# Patient Record
Sex: Female | Born: 1955 | Race: Black or African American | Hispanic: No | Marital: Single | Smoking: Never smoker
Health system: Southern US, Community
[De-identification: ages and names within clinical notes are randomized; demographics above are authoritative.]

## PROBLEM LIST (undated history)

## (undated) DIAGNOSIS — E785 Hyperlipidemia, unspecified: Secondary | ICD-10-CM

## (undated) DIAGNOSIS — F329 Major depressive disorder, single episode, unspecified: Secondary | ICD-10-CM

## (undated) DIAGNOSIS — M1990 Unspecified osteoarthritis, unspecified site: Secondary | ICD-10-CM

## (undated) DIAGNOSIS — I700 Atherosclerosis of aorta: Secondary | ICD-10-CM

## (undated) DIAGNOSIS — F32A Depression, unspecified: Secondary | ICD-10-CM

## (undated) DIAGNOSIS — D649 Anemia, unspecified: Secondary | ICD-10-CM

## (undated) DIAGNOSIS — E041 Nontoxic single thyroid nodule: Secondary | ICD-10-CM

## (undated) HISTORY — DX: Atherosclerosis of aorta: I70.0

## (undated) HISTORY — DX: Hyperlipidemia, unspecified: E78.5

## (undated) HISTORY — PX: UTERINE FIBROID EMBOLIZATION: SHX825

## (undated) HISTORY — DX: Anemia, unspecified: D64.9

## (undated) SURGERY — UPPER ENDOSCOPIC ULTRASOUND (EUS) LINEAR
Anesthesia: Monitor Anesthesia Care | Laterality: Left

---

## 1999-03-06 ENCOUNTER — Other Ambulatory Visit: Admission: RE | Admit: 1999-03-06 | Discharge: 1999-03-06 | Payer: Self-pay | Admitting: Obstetrics and Gynecology

## 1999-03-07 ENCOUNTER — Other Ambulatory Visit: Admission: RE | Admit: 1999-03-07 | Discharge: 1999-03-07 | Payer: Self-pay | Admitting: Obstetrics and Gynecology

## 2000-05-31 ENCOUNTER — Encounter: Payer: Self-pay | Admitting: Family Medicine

## 2000-05-31 ENCOUNTER — Encounter: Admission: RE | Admit: 2000-05-31 | Discharge: 2000-05-31 | Payer: Self-pay | Admitting: Family Medicine

## 2003-02-11 ENCOUNTER — Other Ambulatory Visit: Admission: RE | Admit: 2003-02-11 | Discharge: 2003-02-11 | Payer: Self-pay | Admitting: Obstetrics and Gynecology

## 2003-03-11 ENCOUNTER — Encounter: Payer: Self-pay | Admitting: Obstetrics

## 2003-03-11 ENCOUNTER — Ambulatory Visit (HOSPITAL_COMMUNITY): Admission: RE | Admit: 2003-03-11 | Discharge: 2003-03-11 | Payer: Self-pay | Admitting: Obstetrics and Gynecology

## 2003-03-12 ENCOUNTER — Ambulatory Visit (HOSPITAL_COMMUNITY): Admission: RE | Admit: 2003-03-12 | Discharge: 2003-03-12 | Payer: Self-pay | Admitting: Obstetrics

## 2003-03-12 ENCOUNTER — Encounter (INDEPENDENT_AMBULATORY_CARE_PROVIDER_SITE_OTHER): Payer: Self-pay | Admitting: Specialist

## 2003-04-26 ENCOUNTER — Ambulatory Visit (HOSPITAL_COMMUNITY): Admission: RE | Admit: 2003-04-26 | Discharge: 2003-04-26 | Payer: Self-pay | Admitting: Obstetrics

## 2003-04-26 ENCOUNTER — Encounter: Payer: Self-pay | Admitting: Obstetrics

## 2003-05-21 ENCOUNTER — Ambulatory Visit (HOSPITAL_COMMUNITY): Admission: RE | Admit: 2003-05-21 | Discharge: 2003-05-21 | Payer: Self-pay | Admitting: Interventional Radiology

## 2003-06-17 ENCOUNTER — Observation Stay (HOSPITAL_COMMUNITY): Admission: RE | Admit: 2003-06-17 | Discharge: 2003-06-18 | Payer: Self-pay | Admitting: Interventional Radiology

## 2004-03-17 ENCOUNTER — Other Ambulatory Visit: Admission: RE | Admit: 2004-03-17 | Discharge: 2004-03-17 | Payer: Self-pay | Admitting: Obstetrics and Gynecology

## 2005-08-31 ENCOUNTER — Other Ambulatory Visit: Admission: RE | Admit: 2005-08-31 | Discharge: 2005-08-31 | Payer: Self-pay | Admitting: Obstetrics and Gynecology

## 2006-08-07 ENCOUNTER — Ambulatory Visit (HOSPITAL_COMMUNITY): Admission: RE | Admit: 2006-08-07 | Discharge: 2006-08-07 | Payer: Self-pay | Admitting: Gastroenterology

## 2006-08-07 ENCOUNTER — Encounter (INDEPENDENT_AMBULATORY_CARE_PROVIDER_SITE_OTHER): Payer: Self-pay | Admitting: Specialist

## 2008-01-28 ENCOUNTER — Ambulatory Visit (HOSPITAL_BASED_OUTPATIENT_CLINIC_OR_DEPARTMENT_OTHER): Admission: RE | Admit: 2008-01-28 | Discharge: 2008-01-28 | Payer: Self-pay | Admitting: Surgery

## 2008-01-31 ENCOUNTER — Ambulatory Visit: Payer: Self-pay | Admitting: Internal Medicine

## 2008-02-03 ENCOUNTER — Ambulatory Visit (HOSPITAL_COMMUNITY): Admission: RE | Admit: 2008-02-03 | Discharge: 2008-02-03 | Payer: Self-pay | Admitting: Surgery

## 2008-02-10 ENCOUNTER — Ambulatory Visit (HOSPITAL_COMMUNITY): Admission: RE | Admit: 2008-02-10 | Discharge: 2008-02-10 | Payer: Self-pay | Admitting: Surgery

## 2008-03-16 ENCOUNTER — Encounter: Admission: RE | Admit: 2008-03-16 | Discharge: 2008-05-27 | Payer: Self-pay | Admitting: Surgery

## 2008-06-14 ENCOUNTER — Ambulatory Visit (HOSPITAL_COMMUNITY): Admission: RE | Admit: 2008-06-14 | Discharge: 2008-06-15 | Payer: Self-pay | Admitting: Surgery

## 2008-06-14 HISTORY — PX: LAPAROSCOPIC GASTRIC BANDING: SHX1100

## 2008-10-23 ENCOUNTER — Emergency Department (HOSPITAL_COMMUNITY): Admission: EM | Admit: 2008-10-23 | Discharge: 2008-10-23 | Payer: Self-pay | Admitting: *Deleted

## 2009-01-26 ENCOUNTER — Encounter: Admission: RE | Admit: 2009-01-26 | Discharge: 2009-04-26 | Payer: Self-pay | Admitting: Surgery

## 2010-02-06 ENCOUNTER — Encounter: Admission: RE | Admit: 2010-02-06 | Discharge: 2010-02-06 | Payer: Self-pay | Admitting: Surgery

## 2010-03-02 ENCOUNTER — Encounter: Admission: RE | Admit: 2010-03-02 | Discharge: 2010-03-02 | Payer: Self-pay | Admitting: Obstetrics and Gynecology

## 2010-03-28 ENCOUNTER — Encounter: Admission: RE | Admit: 2010-03-28 | Discharge: 2010-03-28 | Payer: Self-pay | Admitting: Surgery

## 2010-03-28 ENCOUNTER — Other Ambulatory Visit: Admission: RE | Admit: 2010-03-28 | Discharge: 2010-03-28 | Payer: Self-pay | Admitting: Interventional Radiology

## 2010-05-10 ENCOUNTER — Ambulatory Visit (HOSPITAL_COMMUNITY): Admission: RE | Admit: 2010-05-10 | Discharge: 2010-05-10 | Payer: Self-pay | Admitting: Endocrinology

## 2010-10-15 ENCOUNTER — Encounter: Payer: Self-pay | Admitting: Surgery

## 2011-01-08 LAB — URINE MICROSCOPIC-ADD ON

## 2011-01-08 LAB — DIFFERENTIAL
Basophils Relative: 1 % (ref 0–1)
Eosinophils Absolute: 0 10*3/uL (ref 0.0–0.7)
Lymphocytes Relative: 17 % (ref 12–46)
Monocytes Relative: 9 % (ref 3–12)
Neutro Abs: 2.9 10*3/uL (ref 1.7–7.7)

## 2011-01-08 LAB — URINALYSIS, ROUTINE W REFLEX MICROSCOPIC
Glucose, UA: NEGATIVE mg/dL
Hgb urine dipstick: NEGATIVE
Specific Gravity, Urine: 1.024 (ref 1.005–1.030)
Urobilinogen, UA: 0.2 mg/dL (ref 0.0–1.0)
pH: 6.5 (ref 5.0–8.0)

## 2011-01-08 LAB — COMPREHENSIVE METABOLIC PANEL
AST: 16 U/L (ref 0–37)
Albumin: 3.5 g/dL (ref 3.5–5.2)
Chloride: 105 mEq/L (ref 96–112)
Creatinine, Ser: 0.68 mg/dL (ref 0.4–1.2)
GFR calc Af Amer: >60 mL/min (ref >60)
Sodium: 140 mEq/L (ref 135–145)
Total Bilirubin: 0.9 mg/dL (ref 0.3–1.2)

## 2011-01-08 LAB — LIPASE, BLOOD: Lipase: 19 U/L (ref 11–59)

## 2011-01-08 LAB — CBC
MCV: 69.3 fL — ABNORMAL LOW (ref 78.0–100.0)
Platelets: 232 10*3/uL (ref 150–400)
WBC: 3.8 10*3/uL — ABNORMAL LOW (ref 4.0–10.5)

## 2011-02-06 NOTE — Procedures (Signed)
NAME:  Felicia Garrett, Felicia Garrett               ACCOUNT NO.:  [id number]   MEDICAL RECORD NO.:  [id number]          PATIENT TYPE:  OUT   LOCATION:  SLEEP CENTER                 FACILITY:  [facility]   PHYSICIAN:  Clinton D. Maple Hudson, MD, FCCP, FACPDATE OF BIRTH:  13-Aug-1954   DATE OF STUDY:  01/28/2008                            NOCTURNAL POLYSOMNOGRAM   REFERRING PHYSICIAN:   REFERRING PHYSICIAN:  Thornton Park. Daphine Deutscher, MD.   INDICATION FOR STUDY:  Hypersomnia with sleep apnea.   EPWORTH SLEEPINESS SCORE:  2/24.   BMI 61.8.  Weight 327 pounds.  Height 61 inches.  Neck 14 inches.   MEDICATIONS:  Home medication charted and reviewed.   SLEEP ARCHITECTURE:  Total sleep time 266 minutes with sleep efficiency  75.6%.  Stage I was 8.5%, stage II 85.2%, stage III absent, REM 6.4% of  total sleep time.  Sleep latency 23 minutes.  REM latency 81 minutes.  Awake after sleep onset 53 minutes.  Arousal index 14.7.  No bedtime  medication was taken.   RESPIRATORY DATA:  Apnea-hypopnea index (AHI) 9.9, indicating mild sleep  apnea.  A total of 44 events were scored, including 12 obstructive  apneas and 32 hypopneas.  Events were seen in all sleep positions.  REM  AHI 0.   OXYGEN DATA:  Mild to moderate snoring with oxygen desaturation to a  nadir of 35%, which looks artifactual.  Mean oxygen saturation through  the study was 95.4% on room air.  Less than 1% of time was spent with  saturation less than 88%.   CARDIAC DATA:  Normal sinus rhythm.   MOVEMENT-PARASOMNIA:  No significant movement disturbance.  Bathroom x2.   IMPRESSIONS-RECOMMENDATIONS:  1. Mild obstructive sleep apnea/hypopnea syndrome, AHI 9.9 per hour,      RDI 16.9 per hour.  Events were not positional.  Moderate snoring      with oxygen desaturation to a nadir of 35%, which was considered      artifact.  Mean oxygen saturation was 95.4%.  2. Scores in this range can be addressed with CPAP if clinically      indicated.  CPAP support in an  immediate      postoperative situation is recommended, either with empiric      pressure at 10 CWP or with autotitration.  Weight loss would      probably be beneficial.      Clinton D. Maple Hudson, MD, [facility], FACP  Diplomate, Biomedical engineer of Sleep Medicine  Electronically Signed     CDY/MEDQ  D:  01/31/2008 10:43:00  T:  01/31/2008 11:02:48  Job:  [id number]

## 2011-02-06 NOTE — Op Note (Signed)
NAMEJOAN, HERSCHBERGER               ACCOUNT NO.:  [id number]   MEDICAL RECORD NO.:  [id number]          PATIENT TYPE:  OIB   LOCATION:  1536                         FACILITY:  [facility]   PHYSICIAN:  Thornton Park. Daphine Deutscher, MD  DATE OF BIRTH:  [birth year]   DATE OF PROCEDURE:  06/14/2008  DATE OF DISCHARGE:  06/15/2008                               OPERATIVE REPORT   PREOPERATIVE DIAGNOSIS:  Morbid obesity with diabetes mellitus and  degenerative disk disease.   PROCEDURE:  Laparoscopic adjustable gastric band APS.   SURGEON:  Thornton Park. Daphine Deutscher, MD   ASSISTANT:  Sandria Bales. Ezzard Standing, M.D.   DESCRIPTION OF PROCEDURE:  Ms. Duana Benedict is a 55 year old African  American lady who was taken to room one on June 14, 2008 and given  general anesthesia.  The abdomen was prepped with Techni-Care and draped  sterilely.  I attempted to enter the abdomen through the left upper  quadrant cutting down in the usual spot anteriorly just below the costal  margin.  However, after working for long time with the OptiVu I could  not penetrate her fascia because it was so tough.   I then did a Hasson approach in the umbilicus and again found very tough  fascia and even performing this open, Dr. Ezzard Standing and I had difficulty  getting in but managed to put a holding stitch to get Hassan cannula in  and insufflated.  Two more ports were placed on the right side and  actually I ended up putting another two more ports in the left just to  get exposure.  I used a harmonic scalpel also.  She had a history of  maybe possibly a small sliding hiatal hernia but she was asymptomatic.  First we put the balloon down and I thought I could see a little bit of  a possible slider and I blew it up with 10 mL of air and with some  pulling back it would reduce after some manipulation but it did not just  plop back up into the chest.  However, I went ahead and dissected  posteriorly and found a very hypertrophic right crus, and kind  of a  hypotrophic left crus and was unable to really established much in the  way of a hiatal hernia back there.  Anteriorly there was only a hint  that there might be a hiatal hernia and after some dissection, I felt  like there was more potential for harm than good.   At that point I elected to put the band passer around and bring it out  along the left crural margin.  An APS band was inserted and brought  around and engaged.  The sizing tube was then placed as I snapped down  onto that.  I then plicated it anteriorly with four sutures using free  needles and tie knots.  When completed, everything looked good.  I also  placed this separate suture along the lesser curvature to the right crus  to tack that and everything looked to be in good position.  I brought  the tubing out through the  right-sided port and fashioned it.  However,  before doing that, I repaired the umbilical defect with 2 sutures of 0  Vicryl.  That was under insufflation and then I secured the band  port within the small transverse incision just above the umbilicus and  to the right.  Once done, all the ports were irrigated, injected with  some anesthetic agent, closed 4-0 Vicryl.  The patient tolerated the  procedure well.  She was taken to recovery room in satisfactory  condition.      Thornton Park Daphine Deutscher, MD  Electronically Signed     MBM/MEDQ  D:  06/14/2008  T:  06/15/2008  Job:  [id number]   cc:   Candyce Churn. Allyne Gee, M.D.  Fax: [phone]

## 2011-02-09 NOTE — Op Note (Signed)
   NAMEJAZARIA, Felicia Garrett                           ACCOUNT NO.:  [id number]   MEDICAL RECORD NO.:  [id number]                   PATIENT TYPE:  AMB   LOCATION:  SDC                                  FACILITY:  [facility]   PHYSICIAN:  Kathreen Cosier, M.D.           DATE OF BIRTH:  10-Nov-1955   DATE OF PROCEDURE:  03/12/2003  DATE OF DISCHARGE:                                 OPERATIVE REPORT   PREOPERATIVE DIAGNOSES:  1. Leiomyomata uteri.  2. Dysfunctional uterine bleeding.  3. Anemia.   PROCEDURES:  1. Hysteroscopy.  2. Dilatation and curettage.   Under general anesthesia, the patient in lithotomy position, abdomen,  perineum, and vagina prepped and draped, the bladder emptied with a straight  catheter.  Bimanual exam revealed the uterus was 14 size, irregular with  multiple myomas.  A bivalve speculum was placed in the vagina.  Anterior lip  of the cervix grasped with a tenaculum.  Endocervix was curetted and a small  amount of tissue obtained.  Endometrial cavity sounded 10 cm.  The cavity  was anterior and irregular.  Cervix dilated to a #25 Pratt.  Hysteroscope  inserted and the cavity was filled with sorbitol solution.  It was noted  that she had multiple myomas in the cavity and on her right side of the  uterus, there was a myoma which occupied at  least 3-4 cm of the cavity and  no ________ noted.  The sharp curettage was performed and then the cavity re-  examined, and apart from the myomas no abnormality was noted.  The procedure  was terminated.  The deficit of sorbitol was 66 mL.  The patient tolerated  the procedure well, was taken to recovery in toco.                                                Kathreen Cosier, M.D.    BAM/MEDQ  D:  03/12/2003  T:  03/12/2003  Job:  [id number]

## 2011-02-09 NOTE — Op Note (Signed)
NAME:  Felicia Garrett, Felicia Garrett               ACCOUNT NO.:  [id number]   MEDICAL RECORD NO.:  [id number]          PATIENT TYPE:  AMB   LOCATION:  ENDO                         FACILITY:  [facility]   PHYSICIAN:  Anselmo Rod, M.D.  DATE OF BIRTH:  January 17, 1956   DATE OF PROCEDURE:  08/07/2006  DATE OF DISCHARGE:                                 OPERATIVE REPORT   PROCEDURE PERFORMED:  Screening colonoscopy.   ENDOSCOPIST:  Anselmo Rod, M.D.   INSTRUMENT USED:  Olympus video colonoscope.   INDICATIONS FOR PROCEDURE:  A 55 year old African American female undergoing  screening colonoscopy to rule out colonic polyps, masses, etc. The patient  has a history of constipation.   PREPROCEDURE PREPARATION:  Informed consent was procured from the patient.  The patient was fasted for 8 hours prior to the procedure and prepped with a  gallon of TriLyte the night prior to the procedure. Risks and benefits of  the procedure including a 10% miss rate of cancer and polyp were discussed  with the patient as well.   PREPROCEDURE PHYSICAL EXAMINATION:  VITAL SIGNS:  The patient had stable  vital signs.  NECK:  Supple.  CHEST:  Clear to auscultation, S1 and S2 regular.  ABDOMEN:  Soft with normal bowel sounds.   DESCRIPTION OF PROCEDURE:  The patient was placed in left lateral decubitus  position and sedated with 150 mcg of fentanyl and 12.5 mg of Versed given  intravenously in slow incremental doses.  Once the patient was adequately  sedated and maintained on low-flow oxygen and continuous cardiac monitoring  the Olympus video colonoscope was advanced from the rectum to the cecum. The  procedure was technically difficult because the patient's morbid obesity.  The patient had a very tortuous colon.  The patient's position was changed  from the left lateral to the supine and the right lateral position and again  back to the supine and the left lateral position. With gentle application of  abdominal pressure  we reached the cecal base. Retroflexion of the rectum  revealed small internal hemorrhoids.  Two small sessile polyps were biopsied  from the transverse colon as mentioned above.   IMPRESSION:  1. Small nonbleeding internal hemorrhoids.  2. Very tortuous colon.  3. Two small sessile polyps biopsied from the transverse colon.  4. Normal-appearing right colon.   RECOMMENDATIONS:  1. Await pathology results.  2. Avoid all nonsteroidals including aspirin for now.  3. Repeat colonoscopy depending on pathology results.  4. Outpatient follow-up as need arises in the future.      Anselmo Rod, M.D.  Electronically Signed     JNM/MEDQ  D:  08/07/2006  T:  08/07/2006  Job:  [id number]   cc:   Candyce Churn. Allyne Gee, M.D.

## 2011-02-13 ENCOUNTER — Other Ambulatory Visit: Payer: Self-pay | Admitting: Endocrinology

## 2011-02-13 DIAGNOSIS — E049 Nontoxic goiter, unspecified: Secondary | ICD-10-CM

## 2011-03-05 ENCOUNTER — Ambulatory Visit
Admission: RE | Admit: 2011-03-05 | Discharge: 2011-03-05 | Disposition: A | Discharge status: Home / Self Care | Payer: BC Managed Care – PPO | Source: Ambulatory Visit | Attending: Endocrinology | Admitting: Endocrinology

## 2011-03-05 DIAGNOSIS — E049 Nontoxic goiter, unspecified: Secondary | ICD-10-CM

## 2011-03-23 ENCOUNTER — Other Ambulatory Visit: Payer: Self-pay | Admitting: Endocrinology

## 2011-03-23 DIAGNOSIS — E049 Nontoxic goiter, unspecified: Secondary | ICD-10-CM

## 2011-06-25 LAB — HEMOGLOBIN AND HEMATOCRIT, BLOOD
HCT: 33.7 — ABNORMAL LOW
Hemoglobin: 10 — ABNORMAL LOW
Hemoglobin: 10.4 — ABNORMAL LOW

## 2011-06-25 LAB — DIFFERENTIAL
Basophils Relative: 1
Eosinophils Absolute: 0
Monocytes Absolute: 0.4
Neutrophils Relative %: 78 — ABNORMAL HIGH

## 2011-06-25 LAB — BASIC METABOLIC PANEL
Chloride: 106
GFR calc Af Amer: >60
Potassium: 3.8

## 2011-06-25 LAB — CBC
MCHC: 31.2
RDW: 17.6 — ABNORMAL HIGH

## 2011-06-25 LAB — GLUCOSE, CAPILLARY: Glucose-Capillary: 141 — ABNORMAL HIGH

## 2011-08-15 ENCOUNTER — Encounter (INDEPENDENT_AMBULATORY_CARE_PROVIDER_SITE_OTHER): Payer: Self-pay | Admitting: Surgery

## 2011-09-03 ENCOUNTER — Ambulatory Visit
Admission: RE | Admit: 2011-09-03 | Discharge: 2011-09-03 | Disposition: A | Discharge status: Home / Self Care | Payer: BC Managed Care – PPO | Source: Ambulatory Visit | Attending: Endocrinology | Admitting: Endocrinology

## 2011-09-03 DIAGNOSIS — E049 Nontoxic goiter, unspecified: Secondary | ICD-10-CM

## 2011-09-05 ENCOUNTER — Ambulatory Visit (INDEPENDENT_AMBULATORY_CARE_PROVIDER_SITE_OTHER): Payer: BC Managed Care – PPO | Admitting: Surgery

## 2011-09-05 ENCOUNTER — Encounter (INDEPENDENT_AMBULATORY_CARE_PROVIDER_SITE_OTHER): Payer: Self-pay

## 2011-09-05 ENCOUNTER — Encounter (INDEPENDENT_AMBULATORY_CARE_PROVIDER_SITE_OTHER): Payer: Self-pay | Admitting: Surgery

## 2011-09-05 DIAGNOSIS — Z452 Encounter for adjustment and management of vascular access device: Secondary | ICD-10-CM

## 2011-09-05 DIAGNOSIS — E669 Obesity, unspecified: Secondary | ICD-10-CM

## 2011-09-05 DIAGNOSIS — E042 Nontoxic multinodular goiter: Secondary | ICD-10-CM

## 2011-09-05 NOTE — Patient Instructions (Signed)

## 2011-09-05 NOTE — Progress Notes (Signed)
Ms. Seyer comes in today with a weight of 229.4. She describes herself as on the cusp of the green zone can be more hungry. She needed to fill. She was low hasn't had a large fill. Looking at which we removed before which was 0.25 went ahead and added 0.2 cc today. Her last visit was August 11 and 2000 levels was bent over year since have seen her. She's been active in the exercise program at [facility]. And I have seen her picture on the billboard for a bariatric program.  She has a multinodular border which I can appreciate to palpation and she asked me to check this over. She showed me the reports and she did indicate this to be a multinodular goiter. She had aspirated which she told me was a multinodular border. Dr. Talmage Nap had suggested that she have this removed. I think that it could be followed but only speak with Dr. Talmage Nap about that. She wants to see me on an as needed basis and I will be fine. I will see her again whenever needed.

## 2012-01-30 ENCOUNTER — Ambulatory Visit: Payer: Self-pay | Admitting: Obstetrics and Gynecology

## 2012-04-07 ENCOUNTER — Other Ambulatory Visit: Payer: Self-pay | Admitting: Endocrinology

## 2012-04-07 DIAGNOSIS — E049 Nontoxic goiter, unspecified: Secondary | ICD-10-CM

## 2012-04-11 ENCOUNTER — Encounter: Payer: Self-pay | Admitting: Obstetrics and Gynecology

## 2012-04-14 ENCOUNTER — Telehealth (INDEPENDENT_AMBULATORY_CARE_PROVIDER_SITE_OTHER): Payer: Self-pay | Admitting: General Surgery

## 2012-04-14 ENCOUNTER — Ambulatory Visit (INDEPENDENT_AMBULATORY_CARE_PROVIDER_SITE_OTHER): Payer: BC Managed Care – PPO | Admitting: General Surgery

## 2012-04-14 ENCOUNTER — Encounter (INDEPENDENT_AMBULATORY_CARE_PROVIDER_SITE_OTHER): Payer: Self-pay | Admitting: General Surgery

## 2012-04-14 VITALS — BP 112/70 | HR 88 | Temp 97.5°F | Resp 14 | Ht 70.0 in | Wt 225.0 lb

## 2012-04-14 DIAGNOSIS — E669 Obesity, unspecified: Secondary | ICD-10-CM

## 2012-04-14 NOTE — Progress Notes (Signed)
Chief complaint: Nausea and vomiting   History: Patient is worked in urgently to the office status post lap band patient's September 2009 by Dr. Daphine Deutscher with good prolonged weight loss of about 100 pounds. She last had a fill of 0.2 cc in December of 2012. She presents with 3 weeks of worsening reflux in 2-3 days of persistent vomiting up any food and most liquids. No nominal pain.  Exam: Gen.: Does not appear ill. Abdomen: Soft and nontender port site looks fine  With this information I removed 0.25 cc from her port. She felt like she had relief and to swallow water well. She will monitor this and call if she does not tolerate solid food and she would like to return as needed.

## 2012-04-14 NOTE — Telephone Encounter (Signed)
Pt calling while vomiting to report she is lap band pt and cannot hold down food.  She has been able to hold down some few warm liquids only.  Lots of regurg at night.  Pt worked into BH's pm schedule.

## 2012-04-14 NOTE — Patient Instructions (Signed)
Call if not able to tolerate solid food

## 2012-04-16 ENCOUNTER — Ambulatory Visit: Payer: Self-pay | Admitting: Obstetrics and Gynecology

## 2012-04-21 ENCOUNTER — Ambulatory Visit
Admission: RE | Admit: 2012-04-21 | Discharge: 2012-04-21 | Disposition: A | Discharge status: Home / Self Care | Payer: BC Managed Care – PPO | Source: Ambulatory Visit | Attending: Endocrinology | Admitting: Endocrinology

## 2012-04-21 DIAGNOSIS — E049 Nontoxic goiter, unspecified: Secondary | ICD-10-CM

## 2012-07-16 ENCOUNTER — Other Ambulatory Visit: Payer: Self-pay | Admitting: Endocrinology

## 2012-07-16 DIAGNOSIS — E049 Nontoxic goiter, unspecified: Secondary | ICD-10-CM

## 2012-09-01 ENCOUNTER — Ambulatory Visit
Admission: RE | Admit: 2012-09-01 | Discharge: 2012-09-01 | Disposition: A | Discharge status: Home / Self Care | Payer: BC Managed Care – PPO | Source: Ambulatory Visit | Attending: Endocrinology | Admitting: Endocrinology

## 2012-09-01 DIAGNOSIS — E049 Nontoxic goiter, unspecified: Secondary | ICD-10-CM

## 2012-10-20 DIAGNOSIS — E041 Nontoxic single thyroid nodule: Secondary | ICD-10-CM | POA: Insufficient documentation

## 2013-01-07 ENCOUNTER — Ambulatory Visit (HOSPITAL_BASED_OUTPATIENT_CLINIC_OR_DEPARTMENT_OTHER): Payer: BC Managed Care – PPO

## 2013-01-29 ENCOUNTER — Ambulatory Visit (HOSPITAL_BASED_OUTPATIENT_CLINIC_OR_DEPARTMENT_OTHER): Payer: BC Managed Care – PPO | Attending: Nurse Practitioner | Admitting: Radiology

## 2013-01-29 VITALS — Ht 70.0 in | Wt 217.0 lb

## 2013-01-29 DIAGNOSIS — R5383 Other fatigue: Secondary | ICD-10-CM

## 2013-01-29 DIAGNOSIS — G4710 Hypersomnia, unspecified: Secondary | ICD-10-CM | POA: Insufficient documentation

## 2013-01-29 DIAGNOSIS — R0683 Snoring: Secondary | ICD-10-CM

## 2013-01-31 DIAGNOSIS — R0989 Other specified symptoms and signs involving the circulatory and respiratory systems: Secondary | ICD-10-CM

## 2013-01-31 DIAGNOSIS — G4730 Sleep apnea, unspecified: Secondary | ICD-10-CM

## 2013-01-31 DIAGNOSIS — G4710 Hypersomnia, unspecified: Secondary | ICD-10-CM

## 2013-01-31 DIAGNOSIS — R0609 Other forms of dyspnea: Secondary | ICD-10-CM

## 2013-01-31 NOTE — Procedures (Signed)
NAME:  Felicia Garrett, Felicia Garrett               ACCOUNT NO.:  [id number]  MEDICAL RECORD NO.:  [id number]          PATIENT TYPE:  OUT  LOCATION:  SLEEP CENTER                 FACILITY:  [facility]  PHYSICIAN:  Clinton D. Maple Hudson, MD, FCCP, FACPDATE OF BIRTH:  [birth year]-1953  DATE OF STUDY:  01/29/2013                           NOCTURNAL POLYSOMNOGRAM  REFERRING PHYSICIAN:  TONYA R WALKER  INDICATION FOR STUDY:  Hypersomnia with sleep apnea.  EPWORTH SLEEPINESS SCORE:  10/24.  BMI 31.1, weight 217 pounds, height 70 inches, neck 14 inches.  MEDICATIONS:  Home medications charted as "no medications."  A baseline diagnostic NPSG on Jan 28, 2008, indicated AHI 9.9 per hour. Body weight for that study was 327 pounds.  SLEEP ARCHITECTURE:  Total sleep time 302 minutes with sleep efficiency 73.5%.  Stage I was 26.5%, stage II 62.3%, stage III absent, REM 11.3% of total sleep time.  Sleep latency 41 minutes, REM latency 318 minutes. Awake after sleep onset at 69.5 minutes.  Arousal index 14.5.  Bedtime medication:  None.  RESPIRATORY DATA:  Apnea-hypopnea index (AHI) 4.8 per hour.  A total of 24 events was scored of which 6 were obstructive apneas, 3 central apneas, and 15 hypopneas.  There were not enough events to meet protocol requirements for split protocol CPAP titration on this study.  OXYGEN DATA:  Moderately loud snoring with oxygen desaturation to a nadir of 84% and mean oxygen saturation through the study of 94.4% on room air.  CARDIAC DATA:  Sinus rhythm with rare PVC.  MOVEMENT-PARASOMNIA:  No significant movement disturbance.  Bathroom x2.  IMPRESSIONS-RECOMMENDATIONS: 1. Unremarkable sleep architecture for Sleep Center environment,     recognizing reduced percentage time spent in REM. 2. Occasional respiratory events with sleep disturbance, not meeting     qualifying protocol for definition of obstructive sleep apnea, AHI     4.8 per hour.  The normal range for adults is from 0-5 events  per     hour.  Moderately loud snoring with oxygen desaturation to a nadir     of 84% and mean oxygen saturation through the study of 94.4% on     room air. 3. Note significant weight loss from original study, Jan 28, 2008, when     AHI was 9.9 per hour and recorded body weight 327 pounds.  This may     explain the discrepancy.     Clinton D. Maple Hudson, MD, [facility], FACP Diplomate, American Board of Sleep Medicine    CDY/MEDQ  D:  01/31/2013 14:17:40  T:  01/31/2013 19:45:47  Job:  [id number]

## 2013-07-06 ENCOUNTER — Other Ambulatory Visit: Payer: Self-pay | Admitting: Endocrinology

## 2013-07-06 DIAGNOSIS — E049 Nontoxic goiter, unspecified: Secondary | ICD-10-CM

## 2013-07-08 ENCOUNTER — Other Ambulatory Visit: Payer: BC Managed Care – PPO

## 2013-08-03 IMAGING — US US SOFT TISSUE HEAD/NECK
1 series · 14 of 25 positions shown · non-contrast
Comparison: 03/05/2011

CLINICAL DATA: Goiter.

THYROID ULTRASOUND
TECHNIQUE: Ultrasound examination of the thyroid gland and adjacent
soft tissues was performed.

[Series 1: us soft tissue head/neck · 0.11mm/px · 14 of 68 slices shown]
[im 1/68]
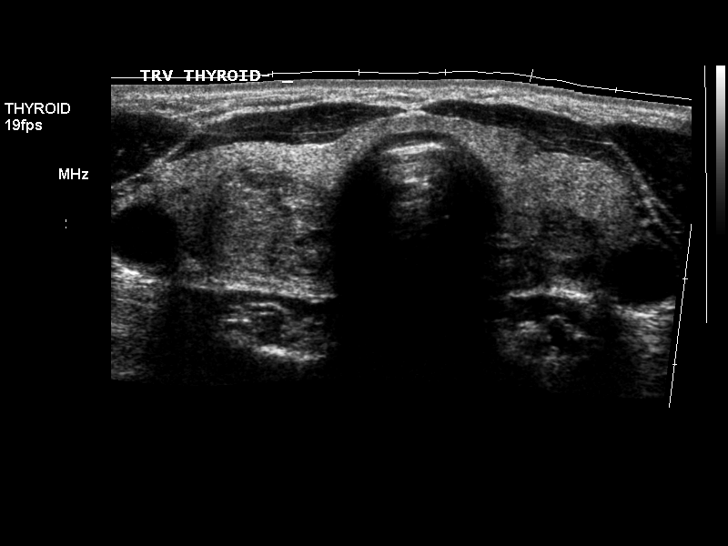
[im 6/68]
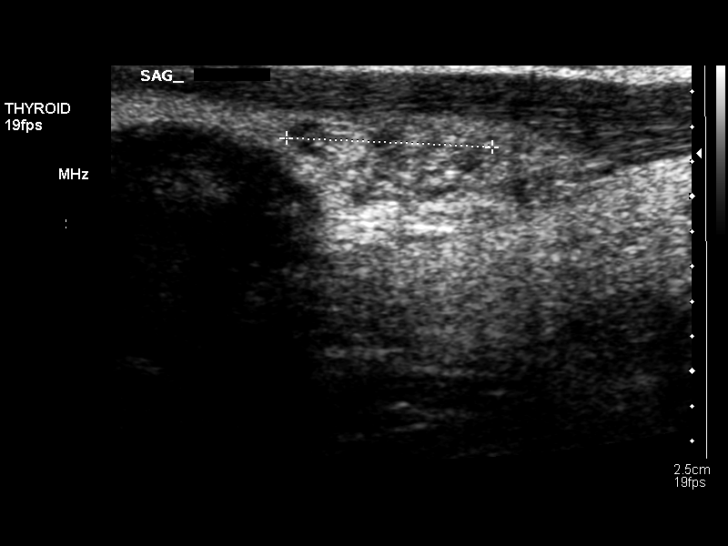
[im 12/68]
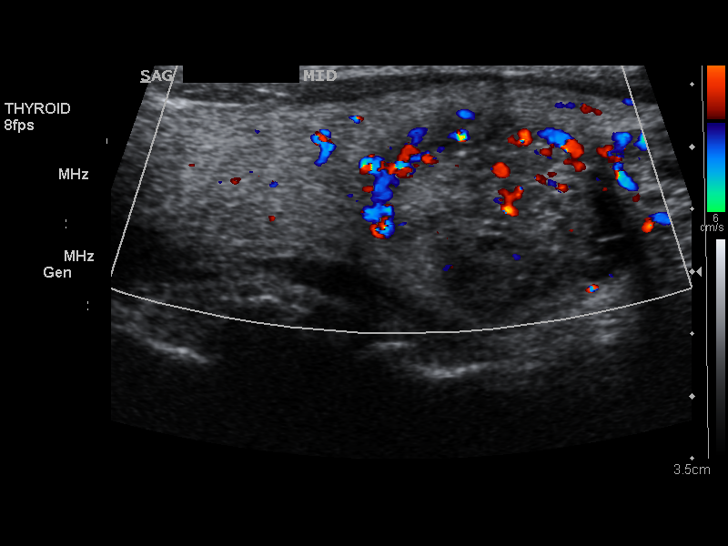
[im 17/68]
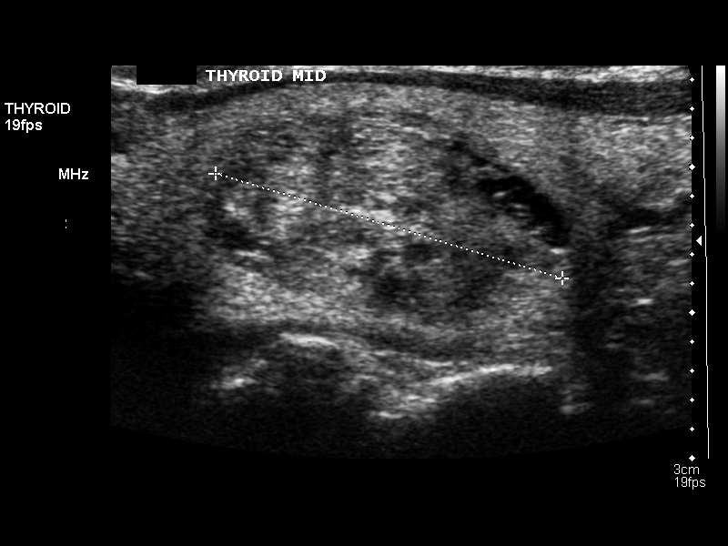
[im 23/68]
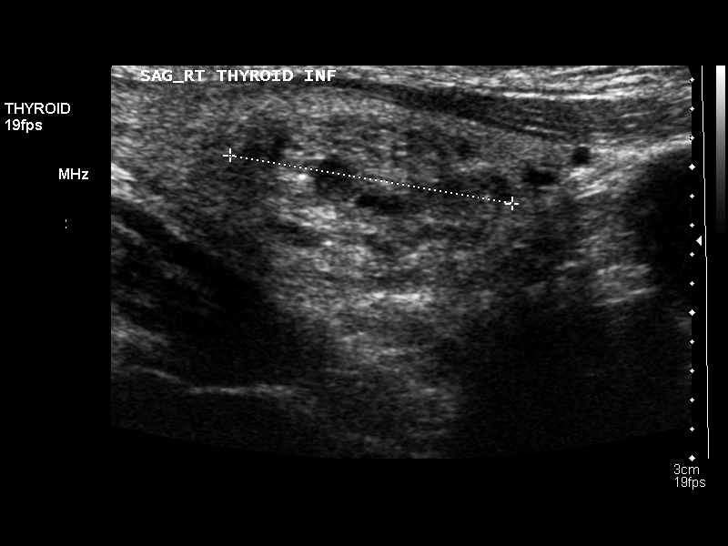
[im 26/68]
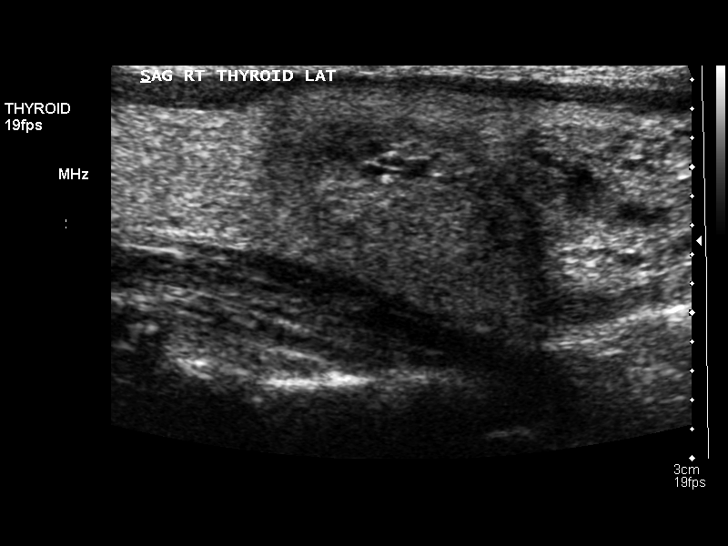
[im 31/68]
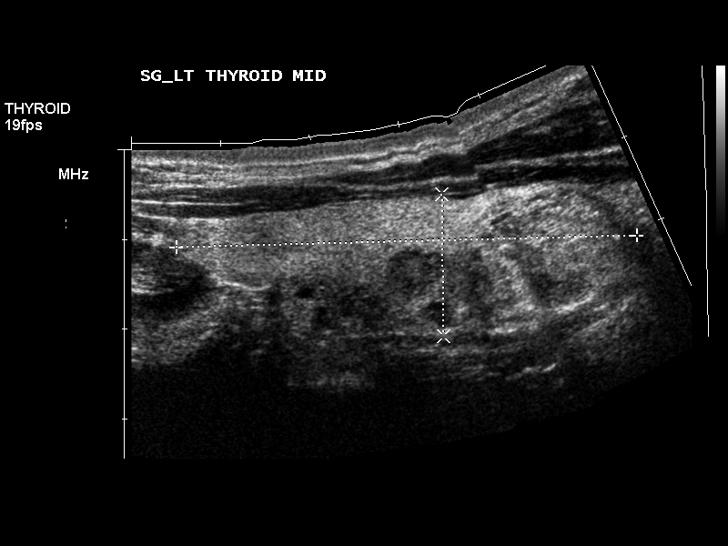
[im 37/68]
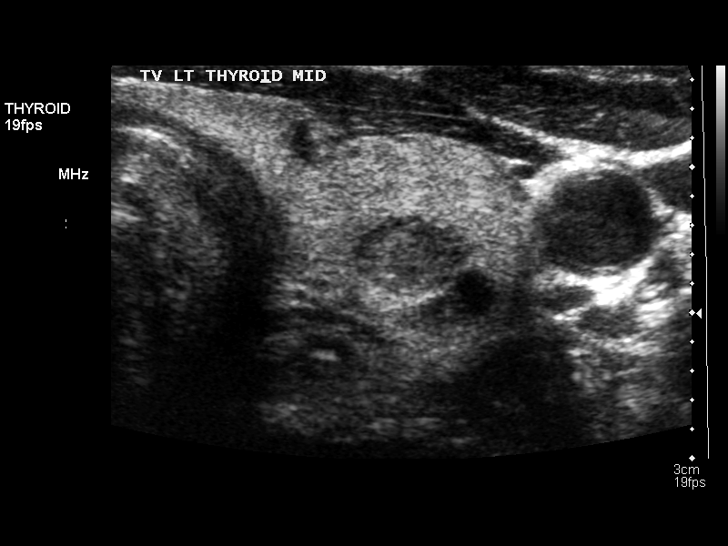
[im 42/68]
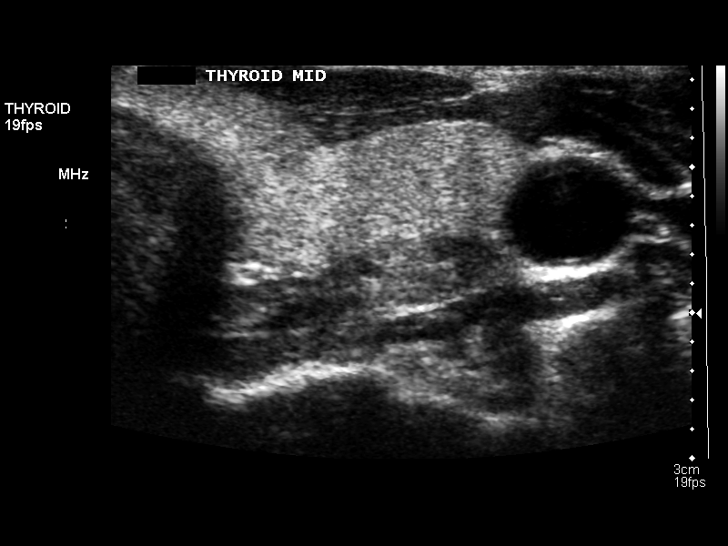
[im 45/68]
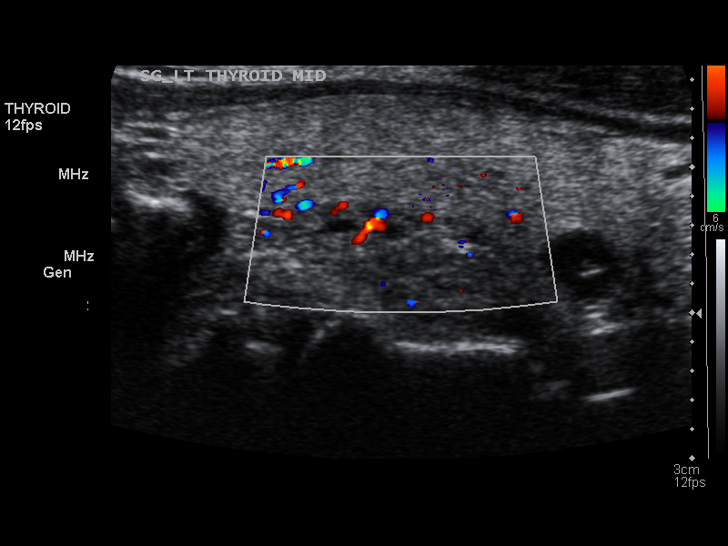
[im 51/68]
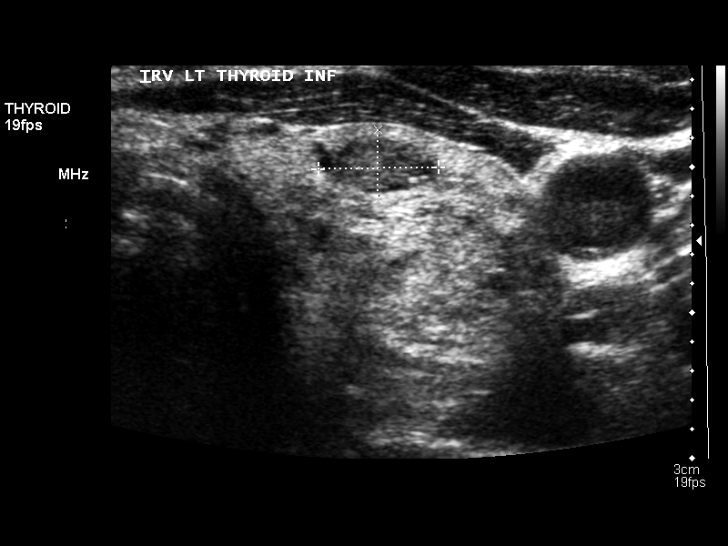
[im 56/68]
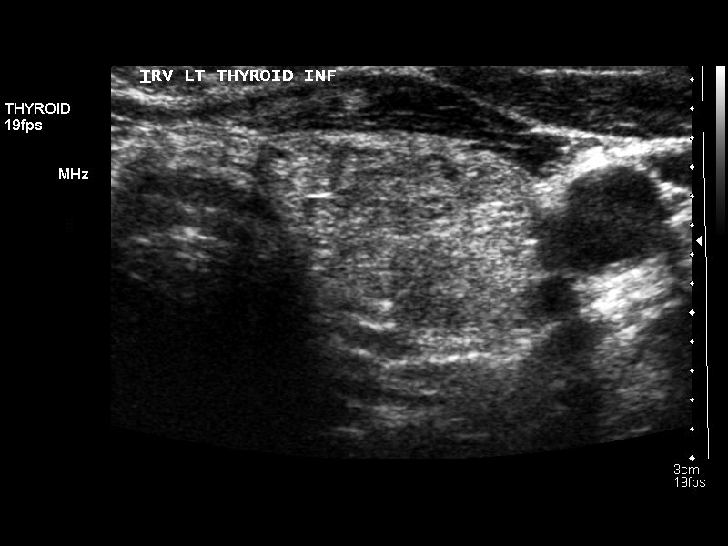
[im 62/68]
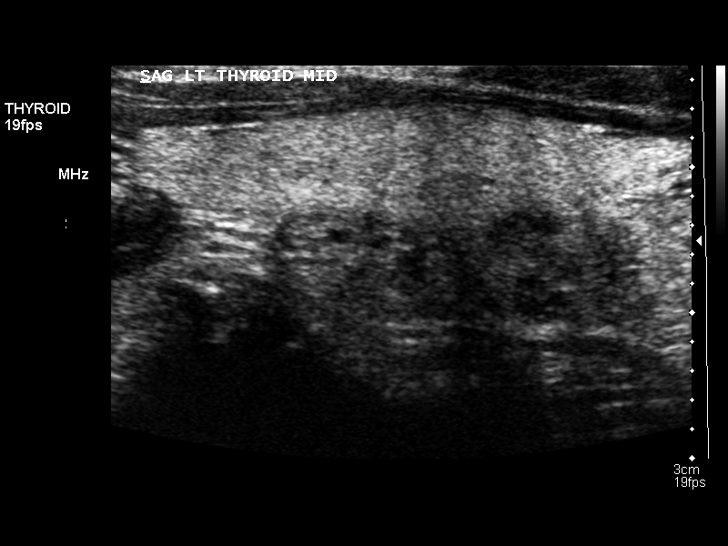
[im 68/68]
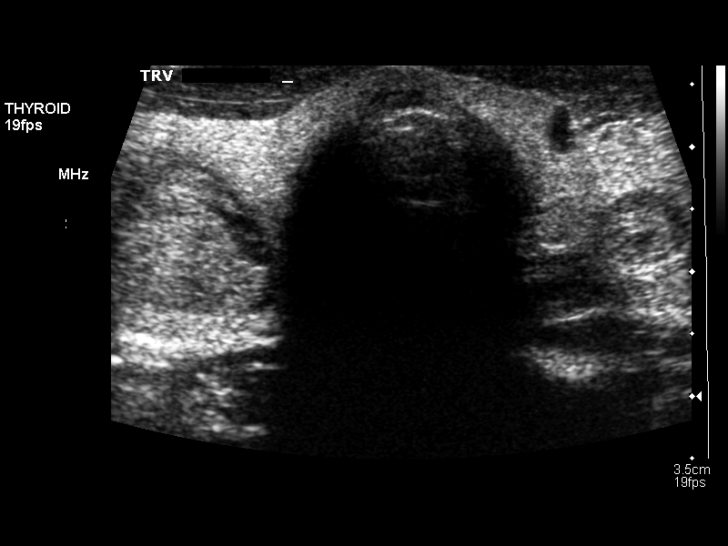

[14 of 25 positions shown; findings below may reference images not displayed]

FINDINGS: Right thyroid lobe:  6.3 x 2.1 x 2.3 cm.
Left thyroid lobe:  5.1 x 1.6 x 2.0 cm.
Isthmus:  0.3 cm.

Focal nodules:  2.0 x 1.3 x 2.5 cm solid nodule in the mid
rightlobe is unchanged.  1.3 x 1.7 x 2.0 cm solid nodule in the
right lower pole is unchanged.

Numerous left lobe nodules appear essentially unchanged as well.
No new nodules.

Lymphadenopathy:  None visualized.
IMPRESSION: Stable multi nodular goiter.

## 2013-08-31 ENCOUNTER — Other Ambulatory Visit: Payer: BC Managed Care – PPO

## 2013-09-11 ENCOUNTER — Other Ambulatory Visit: Payer: BC Managed Care – PPO

## 2013-09-14 ENCOUNTER — Other Ambulatory Visit: Payer: BC Managed Care – PPO

## 2013-09-15 ENCOUNTER — Other Ambulatory Visit: Payer: BC Managed Care – PPO

## 2013-11-18 ENCOUNTER — Other Ambulatory Visit: Payer: BC Managed Care – PPO

## 2014-03-18 ENCOUNTER — Ambulatory Visit
Admission: RE | Admit: 2014-03-18 | Discharge: 2014-03-18 | Disposition: A | Discharge status: Home / Self Care | Payer: BC Managed Care – PPO | Source: Ambulatory Visit | Attending: Endocrinology | Admitting: Endocrinology

## 2014-03-18 DIAGNOSIS — E049 Nontoxic goiter, unspecified: Secondary | ICD-10-CM

## 2014-05-12 ENCOUNTER — Encounter (INDEPENDENT_AMBULATORY_CARE_PROVIDER_SITE_OTHER): Payer: BC Managed Care – PPO | Admitting: Surgery

## 2014-05-14 ENCOUNTER — Ambulatory Visit (INDEPENDENT_AMBULATORY_CARE_PROVIDER_SITE_OTHER): Payer: BC Managed Care – PPO | Admitting: Surgery

## 2014-05-14 VITALS — BP 110/76 | HR 76 | Temp 98.4°F | Ht 70.0 in | Wt 204.4 lb

## 2014-05-14 DIAGNOSIS — E042 Nontoxic multinodular goiter: Secondary | ICD-10-CM

## 2014-05-14 NOTE — Progress Notes (Signed)
Felicia SermonCassandra Garrett 58 y.o.  Body mass index is 29.32 kg/(m^2).  Patient Active Problem List   Diagnosis Date Noted  . 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 Goiter, nontoxic, multinodular 09/05/2011    Allergies  Allergen Reactions  . Penicillins       Past Surgical History  Procedure Laterality Date  . Laparoscopic gastric banding  06/14/08  . Uterine fibroid embolization  10 yrs ago   No primary provider on file. No diagnosis found.  PE shows nodules are soft and not fixed.  No adenopathy.  We discussed management options.  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 She also was worried about developing cancer. I think it would be reasonable to do bilateral nodule aspirates and see if there is any reason to move forward with near-total thyroidectomy. I will go ahead and set this up and see her back in the office and review the results when they're available. Matt B. Daphine DeutscherMartin, MD, [facility]  [facility] [phone] beeper [phone]  05/14/2014 3:17 PM

## 2014-05-14 NOTE — Patient Instructions (Signed)
Goiter Goiter is an enlarged thyroid gland. The thyroid gland sits at the base of the front of the neck. The gland produces hormones that regulate mood, body temperature, pulse rate, and digestion. Most goiters are painless and are not a cause for serious concern. Goiters and conditions that cause goiters can be treated if necessary.  CAUSES  Common causes of goiter include:  Graves disease (causes too much hormone to be produced [hyperthyroidism]).  Hashimoto disease (causes too little hormone to be produced [hypothyroidism]).  Thyroiditis (inflammation of the thyroid sometimes caused by virus or pregnancy).  Nodular goiter (small bumps form; sometimes called toxic nodular goiter).  Pregnancy.  Thyroid cancer (very few goiters with nodules are cancerous).  Certain medications.  Radiation exposure.  Iodine deficiency (more common in developing countries in inland populations). RISK FACTORS Risk factors for goiter include:  A family history of goiter.  Female gender.  Inadequate iodine in the diet.  Age older than 40 years. SYMPTOMS  Many goiters do not cause symptoms. When symptoms do occur, they may include:  Swelling in the lower part of the neck. This swelling can range from a very small bump to a large lump.  A tight feeling in the throat.  A hoarse voice. Less commonly, a goiter may result in:  Coughing.  Wheezing.  Difficulty swallowing.  Difficulty breathing.  Bulging neck veins.  Dizziness. When a goiter is the result of hyperthyroidism, symptoms may include:  Rapid or irregular heartbeat.  Sickness in your stomach (nausea).  Vomiting.  Diarrhea.  Shaking.  Irritable feeling.  Bulging eyes.  Weight loss.  Heat sensitivity.  Anxiety. When a goiter is the result of hypothyroidism, symptoms may include:  Tiredness.  Dry skin.  Constipation.  Weight gain.  Irregular menstrual cycle.  Depressed mood.  Sensitivity to  cold. DIAGNOSIS  Tests used to diagnose goiter include:  A physical exam.  Blood tests, including thyroid hormone levels and antibody testing.  Ultrasonography, computerized X-ray scan (computed tomography, CT) or computerized magnetic scan (magnetic resonance imaging, MRI).  Thyroid scan (imaging along with safe radioactive injection).  Tissue sample taken (biopsy) of nodules. This is sometimes done to confirm that the nodules are not cancerous. TREATMENT  Treatment will depend on the cause of the goiter. Treatment may include:  Monitoring. In some cases, no treatment is necessary, and your doctor will monitor your condition at regular checkups.  Medications and supplements. Thyroid medication (thyroid hormone replacement) is available for hyperthyroidism and hypothyroidism.  If inflammation is the cause, over-the-counter medication or steroid medication may be recommended.  Goiters caused by iodine deficiency can be treated with iodine supplements or changes in diet.  Radioactive iodine treatment. Radioactive iodine is injected into the blood. It travels to the thyroid gland, kills thyroid cells, and reduces the size of the gland. This is only used when the thyroid gland is overactive. Lifelong thyroid hormone medication is often necessary after this treatment.  Surgery. A procedure to remove all or part of the gland may be recommended in severe cases or when cancer is the cause. Hormones can be taken to replace the hormones normally produced by the thyroid. HOME CARE INSTRUCTIONS   Take medications as directed.  Follow your caregiver's recommendations for any dietary changes.  Follow up with your caregiver for further examination and testing, as directed. PREVENTION   If you have a family history of goiter, discuss screening with your doctor.  Make sure you are getting enough iodine in your diet.  Use   of iodized table salt can help prevent iodine deficiency. Document  Released: 02/28/2010 Document Revised: 01/25/2014 Document Reviewed: 02/28/2010 Exit[employer] Patient Information 2015 [employer]. This information is not intended to replace advice given to you by your health care provider. Make sure you discuss any questions you have with your health care provider.  

## 2014-05-14 NOTE — Addendum Note (Signed)
Addended by: Maryan PulsMOORE, Swayze Pries on: 05/14/2014 05:40 PM   Modules accepted: Orders

## 2014-05-17 ENCOUNTER — Other Ambulatory Visit (INDEPENDENT_AMBULATORY_CARE_PROVIDER_SITE_OTHER): Payer: Self-pay | Admitting: Surgery

## 2014-05-17 DIAGNOSIS — E042 Nontoxic multinodular goiter: Secondary | ICD-10-CM

## 2014-05-27 ENCOUNTER — Other Ambulatory Visit: Payer: BC Managed Care – PPO

## 2014-06-02 ENCOUNTER — Ambulatory Visit
Admission: RE | Admit: 2014-06-02 | Discharge: 2014-06-02 | Disposition: A | Discharge status: Home / Self Care | Payer: BC Managed Care – PPO | Source: Ambulatory Visit | Attending: Surgery | Admitting: Surgery

## 2014-06-02 ENCOUNTER — Other Ambulatory Visit (HOSPITAL_COMMUNITY)
Admission: RE | Admit: 2014-06-02 | Discharge: 2014-06-02 | Disposition: A | Discharge status: Home / Self Care | Payer: BC Managed Care – PPO | Source: Ambulatory Visit | Attending: Interventional Radiology | Admitting: Interventional Radiology

## 2014-06-02 DIAGNOSIS — E049 Nontoxic goiter, unspecified: Secondary | ICD-10-CM | POA: Insufficient documentation

## 2014-06-02 DIAGNOSIS — E042 Nontoxic multinodular goiter: Secondary | ICD-10-CM

## 2014-09-28 NOTE — Progress Notes (Signed)
Please put orders in Epic surgery 10-08-14 pre op 10-05-14 Thanks 

## 2014-10-04 NOTE — Patient Instructions (Addendum)
Felicia SermonCassandra Garrett  10/04/2014   Your procedure is scheduled on: 10/08/14   [facility]  En[facility]ance and follow signs to               Short Stay Center at 5:30 AM.   Call this number if you have problems the morning of surgery [phone]   Remember:  Do not eat food or drink liquids :After Midnight.     Take these medicines the morning of surgery with A SIP OF WATER: wellbutrin                               You may not have any metal on your body including hair pins and              piercings  Do not wear jewelry, make-up, lotions, powders or perfumes.             Do not wear nail polish.  Do not shave  48 hours prior to surgery.              Men may shave face and neck.   Do not bring valuables to the hospital. [name] IS NOT             RESPONSIBLE   FOR VALUABLES.  Contacts, dentures or bridgework may not be worn into surgery.  Leave suitcase in the car. After surgery it may be brought to your room.     Patients discharged the day of surgery will not be allowed to drive home.  Name and phone number of your driver:  Special Instructions: N/A              Please read over the following fact sheets you were given: _____________________________________________________________________                                                     [place] - PREPARING FOR SURGERY  Before surgery, you can play an important role.  Because skin is not sterile, your skin needs to be as free of germs as possible.  You can reduce the number of germs on your skin by washing with CHG (chlorahexidine gluconate) soap before surgery.  CHG is an antiseptic cleaner which kills germs and bonds with the skin to continue killing germs even after washing. Please DO NOT use if you have an allergy to CHG or antibacterial soaps.  If your skin becomes reddened/irritated stop using the CHG and inform your nurse when you arrive at Short Stay. Do not shave (including legs  and underarms) for at least 48 hours prior to the first CHG shower.  You may shave your face. Please follow these instructions carefully:   1.  Shower with CHG Soap the night before surgery and the  morning of Surgery.   2.  If you choose to wash your hair, wash your hair first as usual with your  normal  Shampoo.   3.  After you shampoo, rinse your hair and body thoroughly to remove the  shampoo.  4.  Use CHG as you would any other liquid soap.  You can apply chg directly  to the skin and wash . Gently wash with scrungie or clean wascloth    5.  Apply the CHG Soap to your body ONLY FROM THE NECK DOWN.   Do not use on open                           Wound or open sores. Avoid contact with eyes, ears mouth and genitals (private parts).                        Genitals (private parts) with your normal soap.              6.  Wash thoroughly, paying special attention to the area where your surgery  will be performed.   7.  Thoroughly rinse your body with warm water from the neck down.   8.  DO NOT shower/wash with your normal soap after using and rinsing off  the CHG Soap .                9.  Pat yourself dry with a clean towel.             10.  Wear clean pajamas.             11.  Place clean sheets on your bed the night of your first shower and do not  sleep with pets.  Day of Surgery : Do not apply any lotions/deodorants the morning of surgery.  Please wear clean clothes to the hospital/surgery center.  FAILURE TO FOLLOW THESE INSTRUCTIONS MAY RESULT IN THE CANCELLATION OF YOUR SURGERY    PATIENT SIGNATURE_________________________________  ______________________________________________________________________

## 2014-10-05 ENCOUNTER — Encounter (HOSPITAL_COMMUNITY)
Admission: RE | Admit: 2014-10-05 | Discharge: 2014-10-05 | Disposition: A | Discharge status: Home / Self Care | Payer: BC Managed Care – PPO | Source: Ambulatory Visit | Attending: Surgery | Admitting: Surgery

## 2014-10-05 ENCOUNTER — Ambulatory Visit (HOSPITAL_COMMUNITY)
Admission: RE | Admit: 2014-10-05 | Discharge: 2014-10-05 | Disposition: A | Discharge status: Home / Self Care | Payer: BC Managed Care – PPO | Source: Ambulatory Visit | Attending: Anesthesiology | Admitting: Anesthesiology

## 2014-10-05 ENCOUNTER — Encounter (HOSPITAL_COMMUNITY): Payer: Self-pay

## 2014-10-05 DIAGNOSIS — D649 Anemia, unspecified: Secondary | ICD-10-CM | POA: Diagnosis not present

## 2014-10-05 DIAGNOSIS — E041 Nontoxic single thyroid nodule: Secondary | ICD-10-CM | POA: Insufficient documentation

## 2014-10-05 DIAGNOSIS — E119 Type 2 diabetes mellitus without complications: Secondary | ICD-10-CM | POA: Diagnosis not present

## 2014-10-05 DIAGNOSIS — Z01818 Encounter for other preprocedural examination: Secondary | ICD-10-CM | POA: Insufficient documentation

## 2014-10-05 HISTORY — DX: Major depressive disorder, single episode, unspecified: F32.9

## 2014-10-05 HISTORY — DX: Nontoxic single thyroid nodule: E04.1

## 2014-10-05 HISTORY — DX: Unspecified osteoarthritis, unspecified site: M19.90

## 2014-10-05 HISTORY — DX: Depression, unspecified: F32.A

## 2014-10-05 LAB — BASIC METABOLIC PANEL
ANION GAP: 6 (ref 5–15)
BUN: 11 mg/dL (ref 6–23)
CO2: 29 mmol/L (ref 19–32)
CREATININE: 0.72 mg/dL (ref 0.50–1.10)
Calcium: 9.1 mg/dL (ref 8.4–10.5)
Chloride: 103 mEq/L (ref 96–112)
Glucose, Bld: 100 mg/dL — ABNORMAL HIGH (ref 70–99)
POTASSIUM: 3.9 mmol/L (ref 3.5–5.1)
Sodium: 138 mmol/L (ref 135–145)

## 2014-10-05 LAB — CBC
HEMATOCRIT: 37.5 % (ref 36.0–46.0)
Hemoglobin: 11.4 g/dL — ABNORMAL LOW (ref 12.0–15.0)
MCH: 20.8 pg — AB (ref 26.0–34.0)
MCHC: 30.4 g/dL (ref 30.0–36.0)
MCV: 68.3 fL — AB (ref 78.0–100.0)
PLATELETS: 242 10*3/uL (ref 150–400)
RBC: 5.49 MIL/uL — AB (ref 3.87–5.11)
RDW: 17.3 % — AB (ref 11.5–15.5)
WBC: 3.4 10*3/uL — ABNORMAL LOW (ref 4.0–10.5)

## 2014-10-07 ENCOUNTER — Ambulatory Visit (INDEPENDENT_AMBULATORY_CARE_PROVIDER_SITE_OTHER): Payer: Self-pay | Admitting: Surgery

## 2014-10-07 NOTE — H&P (Signed)
Chief Complaint:  indeterminant thyroid nodule with goiter  History of Present Illness:  Felicia Garrett is an 59 y.o. female who has done well with her lapband and who has had a goiter for several years.  Biopsy has not been totally reassuring and she desires to have this goiter removed.  Informed consent obtained in the office.    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    Past Medical History  Diagnosis Date  . Anemia   . Diabetes mellitus   . Hyperlipidemia   . Arthritis   . Thyroid nodule   . Depression     Past Surgical History  Procedure Laterality Date  . Laparoscopic gastric banding  06/14/08  . Uterine fibroid embolization  10 yrs ago    Current Outpatient Prescriptions  Medication Sig Dispense Refill  . buPROPion (WELLBUTRIN XL) 150 MG 24 hr tablet Take 150 mg by mouth every morning.    . Canagliflozin-Metformin HCl (INVOKAMET) 150-1000 MG TABS Take 1 tablet by mouth 2 (two) times daily.    . Cholecalciferol (VITAMIN D) 2000 UNITS tablet Take 2,000 Units by mouth daily.    . exenatide (BYETTA) 10 MCG/0.04ML SOLN Inject 10 mcg into the skin 2 (two) times daily with a meal.      . lisinopril (PRINIVIL,ZESTRIL) 2.5 MG tablet Take 2.5 mg by mouth every morning.    . simvastatin (ZOCOR) 40 MG tablet Take 40 mg by mouth at bedtime.       No current facility-administered medications for this visit.   Penicillins No family history on file. Social History:   reports that she has never smoked. She has never used smokeless tobacco. She reports that she does not drink alcohol or use illicit drugs.   REVIEW OF SYSTEMS : Negative except for weight loss due to lapband.    Physical Exam:   There were no vitals taken for this visit. There is no weight on file to calculate BMI.  Gen:  WDWN AAF NAD  Neurological: Alert and oriented to person, place, and time. Motor and sensory function is grossly intact  Head: Normocephalic and atraumatic.  Eyes:  Conjunctivae are normal. Pupils are equal, round, and reactive to light. No scleral icterus.  Neck: Normal range of motion. Thyroid goiter easily palpable with nodularity Cardiovascular:  SR without murmurs or gallops.  No carotid bruits Breast:  Not examined Respiratory: Effort normal.  No respiratory distress. No chest wall tenderness. Breath sounds normal.  No wheezes, rales or rhonchi.  Abdomen:  Port in place GU:  Not examined Musculoskeletal: Normal range of motion. Extremities are nontender. No cyanosis, edema or clubbing noted Lymphadenopathy: No cervical, preauricular, postauricular or axillary adenopathy is present Skin: Skin is warm and dry. No rash noted. No diaphoresis. No erythema. No pallor. Pscyh: Normal mood and affect. Behavior is normal. Judgment and thought content normal.   LABORATORY RESULTS: No results found for this or any previous visit (from the past 48 hour(s)).   RADIOLOGY RESULTS: Dg Chest 2 View  10/05/2014   CLINICAL DATA:  Preoperative for near total thyroidectomy, thyroid nodule, history anemia, diabetes  EXAM: CHEST  2 VIEW  COMPARISON:  02/03/2008  FINDINGS: Normal heart size, mediastinal contours, and pulmonary vascularity.  Lungs clear.  No pleural effusion or pneumothorax.  Multilevel endplate spur formation thoracic spine.  No acute osseous findings.  IMPRESSION: No acute abnormalities.   Electronically Signed   By: Mark  Boles M.D.   On: 10/05/2014   16:34    Problem List: Patient Active Problem List   Diagnosis Date Noted  . 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 Goiter, nontoxic, multinodular 09/05/2011    Assessment & Plan: Multinodular thyroid Near total thyroidectomy    Matt B. Bernarda Erck, MD, FACS  [facility] [phone] beeper [phone]  10/07/2014 1:33 PM      

## 2014-10-07 NOTE — Progress Notes (Signed)
Sent text to Dr Daphine DeutscherMartin to request pre op orders

## 2014-10-08 ENCOUNTER — Observation Stay (HOSPITAL_COMMUNITY)
Admission: RE | Admit: 2014-10-08 | Discharge: 2014-10-09 | Disposition: A | Discharge status: Home / Self Care | Payer: BC Managed Care – PPO | Source: Ambulatory Visit | Attending: Surgery | Admitting: Surgery

## 2014-10-08 ENCOUNTER — Encounter (HOSPITAL_COMMUNITY)
Admission: RE | Disposition: A | Discharge status: Home / Self Care | Payer: Self-pay | Source: Ambulatory Visit | Attending: Surgery

## 2014-10-08 ENCOUNTER — Ambulatory Visit (HOSPITAL_COMMUNITY): Payer: BC Managed Care – PPO | Admitting: Anesthesiology

## 2014-10-08 ENCOUNTER — Encounter (HOSPITAL_COMMUNITY): Payer: Self-pay | Admitting: *Deleted

## 2014-10-08 DIAGNOSIS — F329 Major depressive disorder, single episode, unspecified: Secondary | ICD-10-CM | POA: Insufficient documentation

## 2014-10-08 DIAGNOSIS — E042 Nontoxic multinodular goiter: Secondary | ICD-10-CM | POA: Diagnosis not present

## 2014-10-08 DIAGNOSIS — Z9889 Other specified postprocedural states: Secondary | ICD-10-CM

## 2014-10-08 DIAGNOSIS — E119 Type 2 diabetes mellitus without complications: Secondary | ICD-10-CM | POA: Insufficient documentation

## 2014-10-08 DIAGNOSIS — E890 Postprocedural hypothyroidism: Secondary | ICD-10-CM

## 2014-10-08 DIAGNOSIS — E669 Obesity, unspecified: Secondary | ICD-10-CM | POA: Diagnosis not present

## 2014-10-08 DIAGNOSIS — Z880 Allergy status to penicillin: Secondary | ICD-10-CM | POA: Insufficient documentation

## 2014-10-08 DIAGNOSIS — E785 Hyperlipidemia, unspecified: Secondary | ICD-10-CM | POA: Diagnosis not present

## 2014-10-08 DIAGNOSIS — Z6829 Body mass index (BMI) 29.0-29.9, adult: Secondary | ICD-10-CM | POA: Diagnosis not present

## 2014-10-08 HISTORY — PX: THYROIDECTOMY: SHX17

## 2014-10-08 LAB — CBC
HEMATOCRIT: 36.5 % (ref 36.0–46.0)
Hemoglobin: 11.1 g/dL — ABNORMAL LOW (ref 12.0–15.0)
MCH: 20.7 pg — ABNORMAL LOW (ref 26.0–34.0)
MCHC: 30.4 g/dL (ref 30.0–36.0)
MCV: 68.1 fL — ABNORMAL LOW (ref 78.0–100.0)
PLATELETS: 238 10*3/uL (ref 150–400)
RBC: 5.36 MIL/uL — AB (ref 3.87–5.11)
RDW: 17.5 % — ABNORMAL HIGH (ref 11.5–15.5)
WBC: 7.4 10*3/uL (ref 4.0–10.5)

## 2014-10-08 LAB — GLUCOSE, CAPILLARY
GLUCOSE-CAPILLARY: 115 mg/dL — AB (ref 70–99)
GLUCOSE-CAPILLARY: 134 mg/dL — AB (ref 70–99)
Glucose-Capillary: 108 mg/dL — ABNORMAL HIGH (ref 70–99)
Glucose-Capillary: 125 mg/dL — ABNORMAL HIGH (ref 70–99)

## 2014-10-08 LAB — CREATININE, SERUM
CREATININE: 0.82 mg/dL (ref 0.50–1.10)
GFR, EST AFRICAN AMERICAN: 88 mL/min — AB (ref >90)
GFR, EST NON AFRICAN AMERICAN: 76 mL/min — AB (ref >90)

## 2014-10-08 SURGERY — THYROIDECTOMY
Anesthesia: General | Site: Neck

## 2014-10-08 MED ORDER — DEXAMETHASONE SODIUM PHOSPHATE 10 MG/ML IJ SOLN
INTRAMUSCULAR | Status: DC | PRN
  Administered 2014-10-08: 10 mg via INTRAVENOUS

## 2014-10-08 MED ORDER — ROCURONIUM BROMIDE 100 MG/10ML IV SOLN
INTRAVENOUS | Status: DC | PRN
  Administered 2014-10-08: 5 mg via INTRAVENOUS
  Administered 2014-10-08: 35 mg via INTRAVENOUS
  Administered 2014-10-08: 10 mg via INTRAVENOUS

## 2014-10-08 MED ORDER — MIDAZOLAM HCL 2 MG/2ML IJ SOLN
INTRAMUSCULAR | Status: AC
  Filled 2014-10-08: qty 2

## 2014-10-08 MED ORDER — MORPHINE SULFATE 2 MG/ML IJ SOLN
1.0000 mg | INTRAMUSCULAR | Status: DC | PRN
  Administered 2014-10-08 – 2014-10-09 (×3): 1 mg via INTRAVENOUS
  Filled 2014-10-08 (×3): qty 1

## 2014-10-08 MED ORDER — SUFENTANIL CITRATE 50 MCG/ML IV SOLN
INTRAVENOUS | Status: DC | PRN
  Administered 2014-10-08: 10 ug via INTRAVENOUS
  Administered 2014-10-08 (×2): 5 ug via INTRAVENOUS
  Administered 2014-10-08: 10 ug via INTRAVENOUS
  Administered 2014-10-08: 5 ug via INTRAVENOUS
  Administered 2014-10-08: 10 ug via INTRAVENOUS
  Administered 2014-10-08: 5 ug via INTRAVENOUS

## 2014-10-08 MED ORDER — ACETAMINOPHEN 10 MG/ML IV SOLN
INTRAVENOUS | Status: DC | PRN
  Administered 2014-10-08: 1000 mg via INTRAVENOUS

## 2014-10-08 MED ORDER — MIDAZOLAM HCL 5 MG/5ML IJ SOLN
INTRAMUSCULAR | Status: DC | PRN
  Administered 2014-10-08: 2 mg via INTRAVENOUS

## 2014-10-08 MED ORDER — LACTATED RINGERS IV SOLN: INTRAVENOUS | Status: DC

## 2014-10-08 MED ORDER — CHLORHEXIDINE GLUCONATE 0.12 % MT SOLN
15.0000 mL | Freq: Two times a day (BID) | OROMUCOSAL | Status: DC
  Administered 2014-10-08: 15 mL via OROMUCOSAL
  Filled 2014-10-08 (×4): qty 15

## 2014-10-08 MED ORDER — ONDANSETRON HCL 4 MG/2ML IJ SOLN
INTRAMUSCULAR | Status: DC | PRN
  Administered 2014-10-08: 4 mg via INTRAVENOUS

## 2014-10-08 MED ORDER — LIDOCAINE HCL (CARDIAC) 20 MG/ML IV SOLN
INTRAVENOUS | Status: DC | PRN
  Administered 2014-10-08: 75 mg via INTRAVENOUS
  Administered 2014-10-08: 25 mg via INTRATRACHEAL

## 2014-10-08 MED ORDER — SUFENTANIL CITRATE 50 MCG/ML IV SOLN
INTRAVENOUS | Status: AC
  Filled 2014-10-08: qty 1

## 2014-10-08 MED ORDER — INSULIN ASPART 100 UNIT/ML ~~LOC~~ SOLN
0.0000 [IU] | SUBCUTANEOUS | Status: DC
  Administered 2014-10-08 (×2): 2 [IU] via SUBCUTANEOUS

## 2014-10-08 MED ORDER — SODIUM CHLORIDE 0.9 % IJ SOLN
INTRAMUSCULAR | Status: AC
Start: 2014-10-08 — End: 2014-10-08
  Filled 2014-10-08: qty 10

## 2014-10-08 MED ORDER — LACTATED RINGERS IV SOLN
INTRAVENOUS | Status: DC | PRN
  Administered 2014-10-08 (×2): via INTRAVENOUS

## 2014-10-08 MED ORDER — 0.9 % SODIUM CHLORIDE (POUR BTL) OPTIME
TOPICAL | Status: DC | PRN
  Administered 2014-10-08: 1000 mL

## 2014-10-08 MED ORDER — SUCCINYLCHOLINE CHLORIDE 20 MG/ML IJ SOLN
INTRAMUSCULAR | Status: DC | PRN
  Administered 2014-10-08: 100 mg via INTRAVENOUS

## 2014-10-08 MED ORDER — GLYCOPYRROLATE 0.2 MG/ML IJ SOLN
INTRAMUSCULAR | Status: DC | PRN
  Administered 2014-10-08: .6 mg via INTRAVENOUS

## 2014-10-08 MED ORDER — LIDOCAINE HCL (CARDIAC) 20 MG/ML IV SOLN
INTRAVENOUS | Status: AC
  Filled 2014-10-08: qty 5

## 2014-10-08 MED ORDER — CETYLPYRIDINIUM CHLORIDE 0.05 % MT LIQD: 7.0000 mL | Freq: Two times a day (BID) | OROMUCOSAL | Status: DC

## 2014-10-08 MED ORDER — HYDROMORPHONE HCL 1 MG/ML IJ SOLN
0.2500 mg | INTRAMUSCULAR | Status: DC | PRN
  Administered 2014-10-08 (×5): 0.25 mg via INTRAVENOUS
  Administered 2014-10-08: 0.5 mg via INTRAVENOUS

## 2014-10-08 MED ORDER — NEOSTIGMINE METHYLSULFATE 10 MG/10ML IV SOLN
INTRAVENOUS | Status: AC
  Filled 2014-10-08: qty 1

## 2014-10-08 MED ORDER — HYDROMORPHONE HCL 1 MG/ML IJ SOLN
INTRAMUSCULAR | Status: AC
  Filled 2014-10-08: qty 1

## 2014-10-08 MED ORDER — HEPARIN SODIUM (PORCINE) 5000 UNIT/ML IJ SOLN
5000.0000 [IU] | Freq: Once | INTRAMUSCULAR | Status: AC
  Administered 2014-10-08: 5000 [IU] via SUBCUTANEOUS
  Filled 2014-10-08: qty 1

## 2014-10-08 MED ORDER — ROCURONIUM BROMIDE 100 MG/10ML IV SOLN
INTRAVENOUS | Status: AC
  Filled 2014-10-08: qty 1

## 2014-10-08 MED ORDER — SODIUM CHLORIDE 0.9 % IJ SOLN
INTRAMUSCULAR | Status: AC
  Filled 2014-10-08: qty 10

## 2014-10-08 MED ORDER — PROPOFOL 10 MG/ML IV BOLUS
INTRAVENOUS | Status: DC | PRN
  Administered 2014-10-08: 200 mg via INTRAVENOUS

## 2014-10-08 MED ORDER — HYDROCODONE-ACETAMINOPHEN 5-325 MG PO TABS: 1.0000 | ORAL_TABLET | ORAL | Status: DC | PRN

## 2014-10-08 MED ORDER — HYDROMORPHONE HCL 2 MG PO TABS
ORAL_TABLET | ORAL | Status: AC
  Filled 2014-10-08: qty 1

## 2014-10-08 MED ORDER — HEPARIN SODIUM (PORCINE) 5000 UNIT/ML IJ SOLN
5000.0000 [IU] | Freq: Three times a day (TID) | INTRAMUSCULAR | Status: DC
  Administered 2014-10-09: 5000 [IU] via SUBCUTANEOUS
  Filled 2014-10-08 (×4): qty 1

## 2014-10-08 MED ORDER — CIPROFLOXACIN IN D5W 400 MG/200ML IV SOLN
INTRAVENOUS | Status: AC
  Filled 2014-10-08: qty 200

## 2014-10-08 MED ORDER — ONDANSETRON HCL 4 MG/2ML IJ SOLN
INTRAMUSCULAR | Status: AC
  Filled 2014-10-08: qty 2

## 2014-10-08 MED ORDER — GLYCOPYRROLATE 0.2 MG/ML IJ SOLN
INTRAMUSCULAR | Status: AC
  Filled 2014-10-08: qty 3

## 2014-10-08 MED ORDER — ACETAMINOPHEN 10 MG/ML IV SOLN
1000.0000 mg | Freq: Once | INTRAVENOUS | Status: AC
  Filled 2014-10-08: qty 100

## 2014-10-08 MED ORDER — NEOSTIGMINE METHYLSULFATE 10 MG/10ML IV SOLN
INTRAVENOUS | Status: DC | PRN
  Administered 2014-10-08: 4 mg via INTRAVENOUS

## 2014-10-08 MED ORDER — KCL IN DEXTROSE-NACL 20-5-0.45 MEQ/L-%-% IV SOLN
INTRAVENOUS | Status: DC
  Administered 2014-10-08: 16:00:00 via INTRAVENOUS
  Filled 2014-10-08 (×2): qty 1000

## 2014-10-08 MED ORDER — DEXAMETHASONE SODIUM PHOSPHATE 10 MG/ML IJ SOLN
INTRAMUSCULAR | Status: AC
  Filled 2014-10-08: qty 1

## 2014-10-08 MED ORDER — CIPROFLOXACIN IN D5W 400 MG/200ML IV SOLN
400.0000 mg | INTRAVENOUS | Status: AC
  Administered 2014-10-08: 400 mg via INTRAVENOUS

## 2014-10-08 MED ORDER — LEVOTHYROXINE SODIUM 100 MCG PO TABS
100.0000 ug | ORAL_TABLET | Freq: Every day | ORAL | Status: DC
  Administered 2014-10-09: 100 ug via ORAL
  Filled 2014-10-08 (×2): qty 1

## 2014-10-08 MED ORDER — PROPOFOL 10 MG/ML IV BOLUS
INTRAVENOUS | Status: AC
  Filled 2014-10-08: qty 20

## 2014-10-08 MED ORDER — LISINOPRIL 2.5 MG PO TABS
2.5000 mg | ORAL_TABLET | Freq: Every morning | ORAL | Status: DC
  Filled 2014-10-08 (×2): qty 1

## 2014-10-08 MED ORDER — CHLORHEXIDINE GLUCONATE 4 % EX LIQD: 1.0000 "application " | Freq: Once | CUTANEOUS | Status: DC

## 2014-10-08 SURGICAL SUPPLY — 40 items
APL SKNCLS STERI-STRIP NONHPOA (GAUZE/BANDAGES/DRESSINGS) ×1
ATTRACTOMAT 16X20 MAGNETIC DRP (DRAPES) ×2 IMPLANT
BENZOIN TINCTURE PRP APPL 2/3 (GAUZE/BANDAGES/DRESSINGS) ×2 IMPLANT
BLADE HEX COATED 2.75 (ELECTRODE) ×2 IMPLANT
BLADE SURG 15 STRL LF DISP TIS (BLADE) ×1 IMPLANT
BLADE SURG 15 STRL SS (BLADE) ×2
BLADE SURG SZ10 CARB STEEL (BLADE) ×2 IMPLANT
CLIP TI MEDIUM 6 (CLIP) ×2 IMPLANT
CLIP TI WIDE RED SMALL 6 (CLIP) ×12 IMPLANT
DISSECTOR ROUND CHERRY 3/8 STR (MISCELLANEOUS) ×2 IMPLANT
DRAPE PED LAPAROTOMY (DRAPES) ×2 IMPLANT
DRESSING SURGICEL FIBRLLR 1X2 (HEMOSTASIS) ×1 IMPLANT
DRSG SURGICEL FIBRILLAR 1X2 (HEMOSTASIS) ×2
ELECT REM PT RETURN 9FT ADLT (ELECTROSURGICAL) ×2
ELECTRODE REM PT RTRN 9FT ADLT (ELECTROSURGICAL) ×1 IMPLANT
GAUZE SPONGE 4X4 12PLY STRL (GAUZE/BANDAGES/DRESSINGS) ×1 IMPLANT
GAUZE SPONGE 4X4 16PLY XRAY LF (GAUZE/BANDAGES/DRESSINGS) ×3 IMPLANT
GLOVE BIOGEL M 8.0 STRL (GLOVE) ×2 IMPLANT
GOWN SPEC L4 XLG W/TWL (GOWN DISPOSABLE) ×2 IMPLANT
GOWN STRL REUS W/TWL LRG LVL3 (GOWN DISPOSABLE) IMPLANT
GOWN STRL REUS W/TWL XL LVL3 (GOWN DISPOSABLE) ×6 IMPLANT
HEMOSTAT SURGICEL 2X14 (HEMOSTASIS) ×1 IMPLANT
KIT BASIN OR (CUSTOM PROCEDURE TRAY) ×2 IMPLANT
LIQUID BAND (GAUZE/BANDAGES/DRESSINGS) ×2 IMPLANT
NS IRRIG 1000ML POUR BTL (IV SOLUTION) ×2 IMPLANT
PACK BASIC VI WITH GOWN DISP (CUSTOM PROCEDURE TRAY) ×2 IMPLANT
PENCIL BUTTON HOLSTER BLD 10FT (ELECTRODE) ×2 IMPLANT
SCALPEL HARMONIC ACE (MISCELLANEOUS) IMPLANT
SHEARS HARMONIC 9CM CVD (BLADE) ×2 IMPLANT
STAPLER VISISTAT 35W (STAPLE) ×2 IMPLANT
STRIP CLOSURE SKIN 1/2X4 (GAUZE/BANDAGES/DRESSINGS) ×2 IMPLANT
SUT MON AB 5-0 PS2 18 (SUTURE) ×8 IMPLANT
SUT SILK 2 0 (SUTURE) ×2
SUT SILK 2-0 18XBRD TIE 12 (SUTURE) ×1 IMPLANT
SUT SILK 3 0 (SUTURE)
SUT SILK 3-0 18XBRD TIE 12 (SUTURE) IMPLANT
SUT VIC AB 4-0 SH 18 (SUTURE) ×2 IMPLANT
SYR BULB IRRIGATION 50ML (SYRINGE) ×2 IMPLANT
TAPE CLOTH SURG 4X10 WHT LF (GAUZE/BANDAGES/DRESSINGS) ×2 IMPLANT
YANKAUER SUCT BULB TIP 10FT TU (MISCELLANEOUS) ×2 IMPLANT

## 2014-10-08 NOTE — Op Note (Signed)
Surgeon: Wenda LowMatt Day Greb, MD, FACS  Asst:  Darnell Levelodd Gerkin, MD, FACS  Anes:  General endotracheal  Procedure: Total thyroidectomy  Diagnosis: Multinodular goiter with multiple nodules  Complications: None noted  EBL:   10 cc  Drains: none  Description of Procedure:  [facility]  After anesthesia was administered and the patient was prepped a timeout was performed.  A silk suture was used to mark the skin below the cricoid cartilage in the skin crease and an incision was made.  The platysma were divided and superior and inferior flaps were raised.  The Mehorner retractor was inserted.  The midline raphe was identified and the sternohyoid and sternothyroid muscles were dissected off the left thyroid lobe.  The superior pole was dissected first and the dissection stayed on the capsule of the thyroid.  The superior thyroid vessels were controlled with medium clips and the Harmonic scalpel.  The lobe was mobilized anteriorally and laterally and the tract of the left recurrent nerve was identified.  There was a prominent pyramidal lobe superiorally and this was dissected separately.  These structures were brought to the midline.    In a very similar fashion the right lobe was mobilized superiorally and then laterally.  Vascular pedicles were controlled with clips and the harmonic scalpel.  The recurrent nerve on the right was appreciated and the right lobe was brought to the midline where the thyroid was removed from the trachea.  Hemostasis was present.  Two veins above the strap muscles were controlled with fig of 8 sutures of 4-0 vicryl.  The strap muscles were approximated with 4-0 vicryl and the subcutaneous layer approximated with 4-0 vicryl.  The skin was approximated with 5-0 moncryl and steristrips.    The patient tolerated the procedure well and was taken to the PACU in stable condition.     Matt B. Daphine DeutscherMartin, MD, [facility] [place] [phone]

## 2014-10-08 NOTE — Anesthesia Postprocedure Evaluation (Signed)
  Anesthesia Post-op Note  Patient: Robby SermonCassandra Gurka  Procedure(s) Performed: Procedure(s): NEAR TOTAL THYROIDECTOMY (N/A) Patient is awake and responsive. Pain and nausea are reasonably well controlled. Vital signs are stable and clinically acceptable. Oxygen saturation is clinically acceptable. There are no apparent anesthetic complications at this time. Patient is ready for discharge.

## 2014-10-08 NOTE — H&P (View-Only) (Signed)
Chief Complaint:  indeterminant thyroid nodule with goiter  History of Present Illness:  Felicia Garrett is an 59 y.o. female who has done well with her lapband and who has had a goiter for several years.  Biopsy has not been totally reassuring and she desires to have this goiter removed.  Informed consent obtained in the office.    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    Past Medical History  Diagnosis Date  . Anemia   . Diabetes mellitus   . Hyperlipidemia   . Arthritis   . Thyroid nodule   . Depression     Past Surgical History  Procedure Laterality Date  . Laparoscopic gastric banding  06/14/08  . Uterine fibroid embolization  10 yrs ago    Current Outpatient Prescriptions  Medication Sig Dispense Refill  . buPROPion (WELLBUTRIN XL) 150 MG 24 hr tablet Take 150 mg by mouth every morning.    . Canagliflozin-Metformin HCl (INVOKAMET) 803 [id number] 9829 MG TABS Take 1 tablet by mouth 2 (two) times daily.    . Cholecalciferol (VITAMIN D) 2000 UNITS tablet Take 2,000 Units by mouth daily.    Marland Kitchen. exenatide (BYETTA) 10 MCG/0.04ML SOLN Inject 10 mcg into the skin 2 (two) times daily with a meal.      . lisinopril (PRINIVIL,ZESTRIL) 2.5 MG tablet Take 2.5 mg by mouth every morning.    . simvastatin (ZOCOR) 40 MG tablet Take 40 mg by mouth at bedtime.       No current facility-administered medications for this visit.   Penicillins No family history on file. Social History:   reports that she has never smoked. She has never used smokeless tobacco. She reports that she does not drink alcohol or use illicit drugs.   REVIEW OF SYSTEMS : Negative except for weight loss due to lapband.    Physical Exam:   There were no vitals taken for this visit. There is no weight on file to calculate BMI.  Gen:  WDWN AAF NAD  Neurological: Alert and oriented to person, place, and time. Motor and sensory function is grossly intact  Head: Normocephalic and atraumatic.  Eyes:  Conjunctivae are normal. Pupils are equal, round, and reactive to light. No scleral icterus.  Neck: Normal range of motion. Thyroid goiter easily palpable with nodularity Cardiovascular:  SR without murmurs or gallops.  No carotid bruits Breast:  Not examined Respiratory: Effort normal.  No respiratory distress. No chest wall tenderness. Breath sounds normal.  No wheezes, rales or rhonchi.  Abdomen:  Port in place GU:  Not examined Musculoskeletal: Normal range of motion. Extremities are nontender. No cyanosis, edema or clubbing noted Lymphadenopathy: No cervical, preauricular, postauricular or axillary adenopathy is present Skin: Skin is warm and dry. No rash noted. No diaphoresis. No erythema. No pallor. Pscyh: Normal mood and affect. Behavior is normal. Judgment and thought content normal.   LABORATORY RESULTS: No results found for this or any previous visit (from the past 48 hour(s)).   RADIOLOGY RESULTS: Dg Chest 2 View  10/05/2014   CLINICAL DATA:  Preoperative for near total thyroidectomy, thyroid nodule, history anemia, diabetes  EXAM: CHEST  2 VIEW  COMPARISON:  02/03/2008  FINDINGS: Normal heart size, mediastinal contours, and pulmonary vascularity.  Lungs clear.  No pleural effusion or pneumothorax.  Multilevel endplate spur formation thoracic spine.  No acute osseous findings.  IMPRESSION: No acute abnormalities.   Electronically Signed   By: Ulyses SouthwardMark  Boles M.D.   On: 10/05/2014  16:34    Problem List: Patient Active Problem List   Diagnosis Date Noted  . 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 Goiter, nontoxic, multinodular 09/05/2011    Assessment & Plan: Multinodular thyroid Near total thyroidectomy    Matt B. Daphine Deutscher, MD, [facility] [phone] beeper [phone]  10/07/2014 1:33 PM

## 2014-10-08 NOTE — Anesthesia Preprocedure Evaluation (Addendum)
Anesthesia Evaluation  Patient identified by MRN, date of birth, ID band Patient awake    Reviewed: Allergy & Precautions, H&P , Patient's Chart, lab work & pertinent test results, reviewed documented beta blocker date and time   Airway Mallampati: II TM Distance: >3 FB Neck ROM: full    Dental no notable dental hx.    Pulmonary  breath sounds clear to auscultation  Pulmonary exam normal       Cardiovascular Rhythm:regular Rate:Normal     Neuro/Psych    GI/Hepatic   Endo/Other  diabetes  Renal/GU      Musculoskeletal   Abdominal   Peds  Hematology   Anesthesia Other Findings   Reproductive/Obstetrics                           Anesthesia Physical Anesthesia Plan  ASA: II  Anesthesia Plan: General   Post-op Pain Management:    Induction: Intravenous  Airway Management Planned: Oral ETT  Additional Equipment:   Intra-op Plan:   Post-operative Plan: Extubation in OR  Informed Consent: I have reviewed the patients History and Physical, chart, labs and discussed the procedure including the risks, benefits and alternatives for the proposed anesthesia with the patient or authorized representative who has indicated his/her understanding and acceptance.   Dental Advisory Given and Dental advisory given  Plan Discussed with: CRNA and Surgeon  Anesthesia Plan Comments: (  Discussed general anesthesia, including possible nausea, instrumentation of airway, sore throat,pulmonary aspiration, etc. I asked if the were any outstanding questions, or  concerns before we proceeded. )        Anesthesia Quick Evaluation  

## 2014-10-08 NOTE — Interval H&P Note (Signed)
History and Physical Interval Note:  10/08/2014 7:40 AM  Felicia Garrett  has presented today for surgery, with the diagnosis of bilateral nodular goiter  The various methods of treatment have been discussed with the patient and family. After consideration of risks, benefits and other options for treatment, the patient has consented to  Procedure(s): NEAR TOTAL THYROIDECTOMY (N/A) as a surgical intervention .  The patient's history has been reviewed, patient examined, no change in status, stable for surgery.  I have reviewed the patient's chart and labs.  Questions were answered to the patient's satisfaction.     [name]

## 2014-10-08 NOTE — Transfer of Care (Signed)
Immediate Anesthesia Transfer of Care Note  Patient: Felicia Garrett  Procedure(s) Performed: Procedure(s): NEAR TOTAL THYROIDECTOMY (N/A)  Patient Location: PACU  Anesthesia Type:General  Level of Consciousness: awake, alert , oriented and patient cooperative  Airway & Oxygen Therapy: Patient Spontanous Breathing and Patient connected to face mask oxygen  Post-op Assessment: Report given to PACU RN, Post -op Vital signs reviewed and stable and Patient moving all extremities X 4  Post vital signs: stable  Complications: No apparent anesthesia complications

## 2014-10-09 DIAGNOSIS — E042 Nontoxic multinodular goiter: Secondary | ICD-10-CM | POA: Diagnosis not present

## 2014-10-09 LAB — CBC
HEMATOCRIT: 34.9 % — AB (ref 36.0–46.0)
Hemoglobin: 10.6 g/dL — ABNORMAL LOW (ref 12.0–15.0)
MCH: 20.7 pg — ABNORMAL LOW (ref 26.0–34.0)
MCHC: 30.4 g/dL (ref 30.0–36.0)
MCV: 68.2 fL — ABNORMAL LOW (ref 78.0–100.0)
PLATELETS: 239 10*3/uL (ref 150–400)
RBC: 5.12 MIL/uL — ABNORMAL HIGH (ref 3.87–5.11)
RDW: 17.5 % — AB (ref 11.5–15.5)
WBC: 6.3 10*3/uL (ref 4.0–10.5)

## 2014-10-09 LAB — COMPREHENSIVE METABOLIC PANEL
ALT: 13 U/L (ref 0–35)
ANION GAP: 7 (ref 5–15)
AST: 18 U/L (ref 0–37)
Albumin: 3.5 g/dL (ref 3.5–5.2)
Alkaline Phosphatase: 37 U/L — ABNORMAL LOW (ref 39–117)
BUN: 10 mg/dL (ref 6–23)
CO2: 29 mmol/L (ref 19–32)
Calcium: 9 mg/dL (ref 8.4–10.5)
Chloride: 104 mEq/L (ref 96–112)
Creatinine, Ser: 0.88 mg/dL (ref 0.50–1.10)
GFR calc Af Amer: 81 mL/min — ABNORMAL LOW (ref >90)
GFR calc non Af Amer: 70 mL/min — ABNORMAL LOW (ref >90)
Glucose, Bld: 109 mg/dL — ABNORMAL HIGH (ref 70–99)
Potassium: 4.1 mmol/L (ref 3.5–5.1)
Sodium: 140 mmol/L (ref 135–145)
TOTAL PROTEIN: 6.2 g/dL (ref 6.0–8.3)
Total Bilirubin: 0.9 mg/dL (ref 0.3–1.2)

## 2014-10-09 LAB — GLUCOSE, CAPILLARY
Glucose-Capillary: 100 mg/dL — ABNORMAL HIGH (ref 70–99)
Glucose-Capillary: 108 mg/dL — ABNORMAL HIGH (ref 70–99)
Glucose-Capillary: 109 mg/dL — ABNORMAL HIGH (ref 70–99)

## 2014-10-09 MED ORDER — HYDROCODONE-ACETAMINOPHEN 7.5-325 MG/15ML PO SOLN: 15.0000 mL | ORAL | Status: DC | PRN

## 2014-10-09 MED ORDER — LEVOTHYROXINE SODIUM 100 MCG PO TABS: 100.0000 ug | ORAL_TABLET | Freq: Every day | ORAL | Status: DC

## 2014-10-09 NOTE — Discharge Instructions (Signed)
Thyroidectomy Thyroidectomy is the removal of part or all of your thyroid gland. Your thyroid gland is a butterfly-shaped gland at the base of your neck. It produces a substance called thyroid hormone, which regulates the physical and chemical processes that keep your body functioning and make energy available to your body (metabolism). The amount of thyroid gland tissue that is removed during a thyroidectomy depends on the reason for the procedure. Typically, if only a part of your gland is removed, enough thyroid gland tissue remains to maintain normal function. If your entire thyroid gland is removed or if the amount of thyroid gland tissue remaining is inadequate to maintain normal function, you will need life-long treatment with thyroid hormone on a daily basis. Thyroidectomy maybe performed when you have the following conditions:  Thyroid nodules. These are small, abnormal collections of tissue that form inside the thyroid gland. If these nodules begin to enlarge at a rapid rate, a sample of tissue from the nodule is taken through a needle and examined (needle biopsy). This is done to determine if the nodules are cancerous. Depending on the outcome of this exam, thyroidectomy may be necessary.  Thyroid cancer.  Goiter, which is an enlarged thyroid gland. All or part of the thyroid gland may be removed if the gland has become so large that it causes difficulty breathing or swallowing.  Hyperthyroidism. This is when the thyroid gland produces too much thyroid hormone. Hypothyroidism can cause symptoms of fluctuating weight, intolerance to heat, irritability, shortness of breath, and chest pain. LET YOUR CAREGIVER KNOW ABOUT:   Allergies to food or medicine.  Medicines that you are taking, including vitamins, herbs, eyedrops, over-the-counter medicines, and creams.  Previous problems you have had with anesthetics or numbing medicines.  History of bleeding problems or blood clots.  Previous  surgeries you have had.  Other health problems, including diabetes and kidney problems, you have had.  Possibility of pregnancy, if this applies. BEFORE THE PROCEDURE   Do not eat or drink anything, including water, for at least 6 hours before the procedure.  Ask your caregiver whether you should stop taking certain medicines before the day of the procedure. PROCEDURE  There are different ways that thyroidectomy is performed. For each type, you will be given a medicine to make you sleep (general anesthetic). The three main types of thyroidectomy are listed as follows:  Conventional thyroidectomy--A cut (incision) in the center portion of your lower neck is made with a scalpel. Muscles below your skin are separated to gain access to your thyroid gland. Your thyroid gland is dissected from your windpipe (trachea). Often a drain is placed at the incision site to drain any blood that accumulates under the skin after the procedure. This drain will be removed before you go home. The wound from the incision should heal within 2 weeks.  Endoscopic thyroidectomy--Small incisions are made in your lower neck. A small instrument (endoscope) is inserted under your skin at the incision sites. The endoscope used for thyroidectomy consists of 2 flexible tubes. Inside one of the tubes is a video camera that is used to guide the Psychologist, sport and exercise. Tools to remove the thyroid gland, including a tool to cut the gland (dissectors) and a suction device, are inserted through the other tube. The surgeon uses the dissectors to dissect the thyroid gland from the trachea and remove it.  Robotic thyroidectomy--This procedure allows your thyroid gland to be removed through incisions in your armpit, your chest, or high in your neck. Instruments similar  to endoscopes provide a 3-dimensional picture of the surgical site. Dissecting instruments are controlled by devices similar to joysticks. These devices allow more accurate manipulation of  the instruments. After the blood supply to the gland is removed, the gland is cut into several pieces and removed through the incisions. °RISKS AND COMPLICATIONS °Complications associated with thyroidectomy are rare, but they can occur. Possible complications include: °· A decrease in parathyroid hormone levels (hypoparathyroidism)--Your parathyroid glands are located close behind your thyroid gland. They are responsible for maintaining calcium levels inthe body. If they are damaged or removed, levels of calcium in the blood become low and nerves become irritable, which can cause muscle spasms. Medicines are available to treat this. °· Bacterial infection--This can often be treated with medicines that kill bacteria (antibiotics). °· Damage to your voice box nerves--This could cause hoarseness or complete loss of voice. °· Bleeding or airway obstruction. °AFTER THE PROCEDURE  °· You will rest in the recovery room as you wake up. °· When you first wake up, your throat may feel slightly sore. °· You will not be allowed to eat or drink until instructed otherwise. °· You will be taken to your hospital room. You will usually stay at the hospital for 1 or 2 nights. °· If a drain is placed during the procedure, it usually is removed the next day. °· You may have some mild neck pain. °· Your voice may be weak. This usually is temporary. °Document Released: 03/06/2001 Document Revised: 01/05/2013 Document Reviewed: 12/13/2010 °[employer]® Patient Information ©2015 [employer]. This information is not intended to replace advice given to you by your health care provider. Make sure you discuss any questions you have with your health care provider. ° °

## 2014-10-09 NOTE — Discharge Summary (Signed)
Physician Discharge Summary  Patient ID: Felicia Garrett MRN: [id number] DOB/AGE: [birth year] 59 y.o.  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  Incision OK.  Calcium is OK.  Voice is OK.    Consults: none  Significant Diagnostic Studies: path pending    Discharge Exam: Blood pressure 120/47, pulse 78, temperature 98.8 F (37.1 C), temperature source Oral, resp. rate 18, height 5\' 10"  (1.778 m), weight 204 lb (92.534 kg), SpO2 98 %. Incision OK.  Voice OK  Disposition:   Discharge Instructions    Diet Carb Modified    Complete by:  As directed      Discharge instructions    Complete by:  As directed   May remove dressing and shower.     Increase activity slowly    Complete by:  As directed             Medication List    TAKE these medications        buPROPion 150 MG 24 hr tablet  Commonly known as:  WELLBUTRIN XL  Take 150 mg by mouth every morning.     exenatide 10 MCG/0.04ML Sopn injection  Commonly known as:  BYETTA  Inject 10 mcg into the skin 2 (two) times daily with a meal.     HYDROcodone-acetaminophen 7.5-325 mg/15 ml solution  Commonly known as:  HYCET  Take 15 mLs by mouth every 4 (four) hours as needed for moderate pain.     INVOKAMET [phone] MG Tabs  Generic drug:  Canagliflozin-Metformin HCl  Take 1 tablet by mouth 2 (two) times daily.     lisinopril 2.5 MG tablet  Commonly known as:  PRINIVIL,ZESTRIL  Take 2.5 mg by mouth every morning.     simvastatin 40 MG tablet  Commonly known as:  ZOCOR  Take 40 mg by mouth at bedtime.     Vitamin D 2000 UNITS tablet  Take 2,000 Units by mouth daily.           Follow-up Information    Follow up with Valarie MerinoMARTIN,Arye Weyenberg B, MD.   Specialty:  General Surgery   Contact information:   [address] [place] [address] [phone]       Signed: Valarie MerinoMARTIN,Ashelyn Mccravy B 10/09/2014, 10:17 AM

## 2014-10-09 NOTE — Progress Notes (Signed)
Utilization Review completed.  

## 2014-10-11 ENCOUNTER — Encounter (HOSPITAL_COMMUNITY): Payer: Self-pay | Admitting: Surgery

## 2015-06-30 ENCOUNTER — Encounter: Payer: Self-pay | Admitting: Podiatry

## 2015-06-30 ENCOUNTER — Ambulatory Visit (INDEPENDENT_AMBULATORY_CARE_PROVIDER_SITE_OTHER): Payer: BC Managed Care – PPO | Admitting: Podiatry

## 2015-06-30 VITALS — BP 101/58 | HR 89 | Resp 16

## 2015-06-30 DIAGNOSIS — L600 Ingrowing nail: Secondary | ICD-10-CM

## 2015-06-30 DIAGNOSIS — L84 Corns and callosities: Secondary | ICD-10-CM

## 2015-06-30 NOTE — Patient Instructions (Signed)

## 2015-06-30 NOTE — Progress Notes (Signed)
   Subjective:    Patient ID: Felicia Garrett, female    DOB: Aug 24, 1956, 60 y.o.   MRN: [id number]  HPI Pt presents with painful left great and 5th nail. Both are ingrown   Review of Systems  All other systems reviewed and are negative.      Objective:   Physical Exam        Assessment & Plan:

## 2015-07-02 NOTE — Progress Notes (Signed)
Subjective:     Patient ID: Felicia Garrett, female   DOB: May 28, 1956, 59 y.o.   MRN: [id number]  HPI patient states I've had a lot of problems with my left big toenail and that I have a corn or nail on the outside of the left fifth nail that's been tender and making it hard for me to walk   Review of Systems     Objective:   Physical Exam Neurovascular status intact muscle strength adequate range of motion within normal limits with patient noted to have incurvated left hallux nail medial border and 8 keratotic lesion on the fifth toe left distal portion lateral side which is either nail foreign secondary to structural now rotation    Assessment:     Ingrown toenail deformity left hallux with pain and lesion on the distal lateral aspect fifth toe left with pain    Plan:     H&P and conditions reviewed with patient. I've recommended removal of the nail corner explaining procedure and risk and patient wants this done. I went ahead and I infiltrated the left big toe 60 mg Xylocaine Marcaine mixture removed the corner medial and exposed the root applying phenol 3 applications 30 seconds followed by alcohol lavage and sterile dressing. I then debrided the lesion on the fifth toe which was tolerated well

## 2015-07-15 ENCOUNTER — Other Ambulatory Visit: Payer: Self-pay

## 2015-07-15 MED ORDER — CLINDAMYCIN HCL 300 MG PO CAPS: 300.0000 mg | ORAL_CAPSULE | Freq: Three times a day (TID) | ORAL | Status: DC

## 2015-07-15 NOTE — Progress Notes (Signed)
Pt callled c/o warmth, redness and swelling at nail avulsion site, clindamycin 300mg  TID x 7 days called in per Dr Ardelle AntonWagoner, advised to continue with soaks and follow up if symptoms persist or if there are any adverse changes

## 2015-12-27 ENCOUNTER — Other Ambulatory Visit: Payer: Self-pay | Admitting: Gastroenterology

## 2015-12-27 DIAGNOSIS — R1310 Dysphagia, unspecified: Secondary | ICD-10-CM

## 2015-12-30 ENCOUNTER — Ambulatory Visit
Admission: RE | Admit: 2015-12-30 | Discharge: 2015-12-30 | Disposition: A | Discharge status: Home / Self Care | Payer: BC Managed Care – PPO | Source: Ambulatory Visit | Attending: Gastroenterology | Admitting: Gastroenterology

## 2015-12-30 DIAGNOSIS — R1310 Dysphagia, unspecified: Secondary | ICD-10-CM

## 2017-02-20 LAB — HM COLONOSCOPY

## 2017-05-02 ENCOUNTER — Ambulatory Visit: Payer: BC Managed Care – PPO | Admitting: Podiatry

## 2018-01-31 ENCOUNTER — Ambulatory Visit
Admission: RE | Admit: 2018-01-31 | Discharge: 2018-01-31 | Disposition: A | Discharge status: Home / Self Care | Payer: BC Managed Care – PPO | Source: Ambulatory Visit | Attending: Nurse Practitioner | Admitting: Nurse Practitioner

## 2018-01-31 ENCOUNTER — Other Ambulatory Visit: Payer: Self-pay | Admitting: Nurse Practitioner

## 2018-01-31 DIAGNOSIS — R059 Cough, unspecified: Secondary | ICD-10-CM

## 2018-01-31 DIAGNOSIS — R05 Cough: Secondary | ICD-10-CM

## 2018-03-26 ENCOUNTER — Other Ambulatory Visit: Payer: Self-pay | Admitting: Internal Medicine

## 2018-03-26 DIAGNOSIS — E2839 Other primary ovarian failure: Secondary | ICD-10-CM

## 2018-06-24 ENCOUNTER — Other Ambulatory Visit: Payer: Self-pay | Admitting: Internal Medicine

## 2018-06-28 ENCOUNTER — Other Ambulatory Visit: Payer: Self-pay | Admitting: Internal Medicine

## 2018-07-02 ENCOUNTER — Ambulatory Visit: Payer: BC Managed Care – PPO | Admitting: Internal Medicine

## 2018-07-02 ENCOUNTER — Encounter: Payer: Self-pay | Admitting: Internal Medicine

## 2018-07-02 VITALS — BP 116/70 | HR 67 | Temp 97.9°F | Ht 70.0 in | Wt 202.6 lb

## 2018-07-02 DIAGNOSIS — E1165 Type 2 diabetes mellitus with hyperglycemia: Secondary | ICD-10-CM

## 2018-07-02 DIAGNOSIS — L989 Disorder of the skin and subcutaneous tissue, unspecified: Secondary | ICD-10-CM | POA: Diagnosis not present

## 2018-07-02 DIAGNOSIS — D649 Anemia, unspecified: Secondary | ICD-10-CM | POA: Diagnosis not present

## 2018-07-02 LAB — HEMOGLOBIN A1C
Est. average glucose Bld gHb Est-mCnc: 126 mg/dL
Hgb A1c MFr Bld: 6 % — ABNORMAL HIGH (ref 4.8–5.6)

## 2018-07-02 NOTE — Progress Notes (Addendum)
Subjective:     Patient ID: Felicia Garrett , female    DOB: 1956-02-25 , 62 y.o.   MRN: [id number]   HPI  Pt is here to have HGBA1C repeated since her DM dose was changed last time. Has been doing well. She has noticed a mole on the L dorsal foot is getting larger with irregular edges and she never went to the dermatologist she was referred last year. She would like a new referral placed.  Past Medical History:  Diagnosis Date  . Anemia   . Arthritis   . Depression   . Diabetes mellitus   . Hyperlipidemia   . Thyroid nodule     ALLERGIES Penicillin  Current Outpatient Medications:  .  buPROPion (WELLBUTRIN XL) 150 MG 24 hr tablet, Take 150 mg by mouth every morning., Disp: , Rfl:  .  levothyroxine (SYNTHROID, LEVOTHROID) 100 MCG tablet, Take 1 tablet (100 mcg total) by mouth daily before breakfast., Disp: 60 tablet, Rfl: 1 .  losartan-hydrochlorothiazide (HYZAAR) 50-12.5 MG tablet, losartan 50 mg-hydrochlorothiazide 12.5 mg tablet, Disp: , Rfl:  .  OZEMPIC, 1 MG/DOSE, 2 MG/1.5ML SOPN, PLEASE SEE ATTACHED FOR DETAILED DIRECTIONS, Disp: 1 pen, Rfl: 2 .  simvastatin (ZOCOR) 40 MG tablet, Take 40 mg by mouth at bedtime.  , Disp: , Rfl:  .  XIGDUO XR 01-999 MG TB24, TAKE 1 TABLET BY ORAL ROUTE 2 TIMES EVERY DAY IN THE MORNING WITH FOOD, Disp: 60 tablet, Rfl: 2   Review of Systems  Constitutional: Negative for diaphoresis.  Respiratory: Negative for chest tightness and shortness of breath.   Cardiovascular: Negative for chest pain and leg swelling.  Endocrine: Negative for polydipsia, polyphagia and polyuria.  Genitourinary: Negative for dysuria.  Skin: Negative for wound.       Denies slow healing wounds  Neurological: Negative for numbness.  Psychiatric/Behavioral: Sleep disturbance:      Today's Vitals   07/02/18 1116  BP: 116/70  Pulse: 67  Temp: 97.9 F (36.6 C)  TempSrc: Oral  SpO2: 97%  Weight: 202 lb 9.6 oz (91.9 kg)  Height: 5\' 10"  (1.778 m)   Body mass index is  29.07 kg/m.   Objective:  Physical Exam  Constitutional: She is oriented to person, place, and time. She appears well-developed and well-nourished. No distress.  HENT:  Head: Normocephalic.  Nose: Nose normal.  Eyes: Conjunctivae are normal. Right eye exhibits no discharge. Left eye exhibits no discharge. No scleral icterus.  Neck: Normal range of motion. Neck supple.  Cardiovascular: Normal rate, regular rhythm and intact distal pulses.  No murmur heard. Pulmonary/Chest: Effort normal and breath sounds normal.  Musculoskeletal: She exhibits no edema.  Lymphadenopathy:    She has no cervical adenopathy.  Neurological: She is alert and oriented to person, place, and time.  Skin: Skin is warm and dry. Capillary refill takes less than 2 seconds. No rash noted. She is not diaphoretic. No erythema.  Psychiatric: She has a normal mood and affect. Her behavior is normal. Judgment and thought content normal.  Vitals reviewed.  SKIN continuation- has dark brown mole on L dorsal foot present.      Assessment And Plan:     1. Uncontrolled type 2 diabetes mellitus with hyperglycemia (HCC)- chronic  - Hemoglobin A1c  2. Skin lesion- chronic - Ambulatory referral to Dermatology  3. Anemia, unspecified type- chronic. I ordered - CBC no Diff. We will call with results when they are back.  -     FU with  PCP in 3 months. We will inform her of her results when they are back. May continue current medications.   Kirstin Kugler RODRIGUEZ-SOUTHWORTH, PA-C

## 2018-07-03 DIAGNOSIS — Z794 Long term (current) use of insulin: Secondary | ICD-10-CM

## 2018-07-03 DIAGNOSIS — E1169 Type 2 diabetes mellitus with other specified complication: Secondary | ICD-10-CM | POA: Insufficient documentation

## 2018-07-03 DIAGNOSIS — E119 Type 2 diabetes mellitus without complications: Secondary | ICD-10-CM | POA: Insufficient documentation

## 2018-07-03 DIAGNOSIS — D649 Anemia, unspecified: Secondary | ICD-10-CM | POA: Insufficient documentation

## 2018-07-03 NOTE — Addendum Note (Signed)
Addended by: Radene Knee on: 07/03/2018 02:22 PM   Modules accepted: Orders

## 2018-07-04 LAB — CBC
Hematocrit: 38.7 % (ref 34.0–46.6)
Hemoglobin: 11.3 g/dL (ref 11.1–15.9)
MCH: 20.8 pg — ABNORMAL LOW (ref 26.6–33.0)
MCHC: 29.2 g/dL — ABNORMAL LOW (ref 31.5–35.7)
MCV: 71 fL — AB (ref 79–97)
PLATELETS: 264 10*3/uL (ref 150–450)
RBC: 5.43 x10E6/uL — AB (ref 3.77–5.28)
RDW: 19.7 % — AB (ref 12.3–15.4)
WBC: 4 10*3/uL (ref 3.4–10.8)

## 2018-07-04 LAB — SPECIMEN STATUS REPORT

## 2018-07-10 NOTE — Progress Notes (Signed)
LDVM

## 2018-07-23 NOTE — Addendum Note (Signed)
Addended by: Radene Knee on: 07/23/2018 12:22 PM   Modules accepted: Orders

## 2018-07-25 LAB — HM DIABETES EYE EXAM

## 2018-07-30 ENCOUNTER — Other Ambulatory Visit: Payer: BC Managed Care – PPO

## 2018-08-03 ENCOUNTER — Other Ambulatory Visit: Payer: Self-pay | Admitting: Internal Medicine

## 2018-08-17 ENCOUNTER — Other Ambulatory Visit: Payer: Self-pay | Admitting: Internal Medicine

## 2018-08-18 ENCOUNTER — Other Ambulatory Visit: Payer: Self-pay | Admitting: Nurse Practitioner

## 2018-08-18 MED ORDER — SEMAGLUTIDE (1 MG/DOSE) 2 MG/1.5ML ~~LOC~~ SOPN: 1.0000 mg | PEN_INJECTOR | SUBCUTANEOUS | 1 refills | Status: DC

## 2018-08-19 ENCOUNTER — Other Ambulatory Visit: Payer: Self-pay

## 2018-08-19 MED ORDER — SEMAGLUTIDE (1 MG/DOSE) 2 MG/1.5ML ~~LOC~~ SOPN: 1.0000 mg | PEN_INJECTOR | SUBCUTANEOUS | 2 refills | Status: DC

## 2018-08-28 ENCOUNTER — Ambulatory Visit (INDEPENDENT_AMBULATORY_CARE_PROVIDER_SITE_OTHER): Payer: BC Managed Care – PPO

## 2018-08-28 ENCOUNTER — Encounter: Payer: Self-pay | Admitting: Podiatry

## 2018-08-28 ENCOUNTER — Ambulatory Visit: Payer: BC Managed Care – PPO | Admitting: Podiatry

## 2018-08-28 ENCOUNTER — Other Ambulatory Visit: Payer: Self-pay | Admitting: Podiatry

## 2018-08-28 DIAGNOSIS — M779 Enthesopathy, unspecified: Secondary | ICD-10-CM

## 2018-08-28 DIAGNOSIS — M25571 Pain in right ankle and joints of right foot: Secondary | ICD-10-CM | POA: Diagnosis not present

## 2018-08-28 DIAGNOSIS — N760 Acute vaginitis: Secondary | ICD-10-CM

## 2018-08-28 DIAGNOSIS — B9689 Other specified bacterial agents as the cause of diseases classified elsewhere: Secondary | ICD-10-CM | POA: Insufficient documentation

## 2018-08-28 DIAGNOSIS — E119 Type 2 diabetes mellitus without complications: Secondary | ICD-10-CM | POA: Insufficient documentation

## 2018-08-28 DIAGNOSIS — R87810 Cervical high risk human papillomavirus (HPV) DNA test positive: Secondary | ICD-10-CM | POA: Insufficient documentation

## 2018-08-28 DIAGNOSIS — E785 Hyperlipidemia, unspecified: Secondary | ICD-10-CM | POA: Insufficient documentation

## 2018-08-28 DIAGNOSIS — R87619 Unspecified abnormal cytological findings in specimens from cervix uteri: Secondary | ICD-10-CM | POA: Insufficient documentation

## 2018-08-28 DIAGNOSIS — E1169 Type 2 diabetes mellitus with other specified complication: Secondary | ICD-10-CM | POA: Insufficient documentation

## 2018-08-28 DIAGNOSIS — E049 Nontoxic goiter, unspecified: Secondary | ICD-10-CM | POA: Insufficient documentation

## 2018-08-28 MED ORDER — TRIAMCINOLONE ACETONIDE 10 MG/ML IJ SUSP
10.0000 mg | Freq: Once | INTRAMUSCULAR | Status: AC
  Administered 2018-08-28: 10 mg

## 2018-08-28 NOTE — Progress Notes (Signed)
Subjective:   Patient ID: Felicia Garrett, female   DOB: 62 y.o.   MRN: [id number]   HPI Patient states she gets pain on the inside of the right ankle and states it seems to be worse after she is been active on it and it gets stiff.  States is been bothering her for a while and she does like to hike.  Patient does not smoke and has A1c that is under excellent control   Review of Systems  All other systems reviewed and are negative.       Objective:  Physical Exam  Constitutional: She appears well-developed and well-nourished.  Cardiovascular: Intact distal pulses.  Pulmonary/Chest: Effort normal.  Musculoskeletal: Normal range of motion.  Neurological: She is alert.  Skin: Skin is warm.  Nursing note and vitals reviewed.   Neurovascular status intact muscle strength is adequate range of motion within normal limits with patient found to have discomfort around the subtalar joint right medial side and mild range of motion loss within this area.  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  Patient will be seen back as needed  X-rays indicate triggers what appears to be arthritis of the talonavicular joint right and partially within this subtalar joint right

## 2018-09-10 ENCOUNTER — Telehealth: Payer: Self-pay | Admitting: Podiatry

## 2018-09-10 NOTE — Telephone Encounter (Signed)
Pt as seen last week and was offered 'Voltaren gel' and declined it. Pt is calling back stating she thinks she wants to get it now. Please give pt a call.

## 2018-09-22 NOTE — Telephone Encounter (Signed)
Pt called again following up on gel. Please give pt a call

## 2018-09-25 ENCOUNTER — Ambulatory Visit
Admission: RE | Admit: 2018-09-25 | Discharge: 2018-09-25 | Disposition: A | Discharge status: Home / Self Care | Payer: BC Managed Care – PPO | Source: Ambulatory Visit | Attending: Internal Medicine | Admitting: Internal Medicine

## 2018-09-25 DIAGNOSIS — E2839 Other primary ovarian failure: Secondary | ICD-10-CM

## 2018-09-25 NOTE — Telephone Encounter (Signed)
Pt presented to office asking for prescription for voltaren gel. I told pt I had reviewed the clinicals and I did not see orders and had sent message to Dr. Charlsie Merles and would call with instructions.

## 2018-09-29 MED ORDER — DICLOFENAC SODIUM 1 % TD GEL: 2.0000 g | Freq: Four times a day (QID) | TRANSDERMAL | 10 refills | Status: DC

## 2018-09-29 NOTE — Telephone Encounter (Signed)
I informed pt, Dr. Charlsie Merles had ordered the Voltaren gel and it had been sent to the [id number].

## 2018-09-29 NOTE — Addendum Note (Signed)
Addended by: Alphia Kava D on: 09/29/2018 08:26 AM   Modules accepted: Orders

## 2018-10-15 ENCOUNTER — Ambulatory Visit: Payer: BC Managed Care – PPO | Admitting: Internal Medicine

## 2018-10-29 ENCOUNTER — Encounter: Payer: Self-pay | Admitting: Internal Medicine

## 2018-10-29 ENCOUNTER — Ambulatory Visit: Payer: BC Managed Care – PPO | Admitting: Internal Medicine

## 2018-10-29 VITALS — BP 116/74 | HR 76 | Temp 98.1°F | Ht 70.0 in | Wt 203.6 lb

## 2018-10-29 DIAGNOSIS — E119 Type 2 diabetes mellitus without complications: Secondary | ICD-10-CM | POA: Diagnosis not present

## 2018-10-29 DIAGNOSIS — I10 Essential (primary) hypertension: Secondary | ICD-10-CM

## 2018-10-29 DIAGNOSIS — R0982 Postnasal drip: Secondary | ICD-10-CM

## 2018-10-29 DIAGNOSIS — Z794 Long term (current) use of insulin: Principal | ICD-10-CM

## 2018-10-29 DIAGNOSIS — H6123 Impacted cerumen, bilateral: Secondary | ICD-10-CM

## 2018-10-29 NOTE — Patient Instructions (Signed)
Postnasal Drip  Postnasal drip is the feeling of mucus going down the back of your throat. Mucus is a slimy substance that moistens and cleans your nose and throat, as well as the air pockets in face bones near your forehead and cheeks (sinuses). Small amounts of mucus pass from your nose and sinuses down the back of your throat all the time. This is normal. When you produce too much mucus or the mucus gets too thick, you can feel it.  Some common causes of postnasal drip include:   Having more mucus because of:  ? A cold or the flu.  ? Allergies.  ? Cold air.  ? Certain medicines.   Having more mucus that is thicker because of:  ? A sinus or nasal infection.  ? Dry air.  ? A food allergy.  Follow these instructions at home:  Relieving discomfort     Gargle with a salt-water mixture 3-4 times a day or as needed. To make a salt-water mixture, completely dissolve -1 tsp of salt in 1 cup of warm water.   If the air in your home is dry, use a humidifier to add moisture to the air.   Use a saline spray or container (neti pot) to flush out the nose (nasal irrigation). These methods can help clear away mucus and keep the nasal passages moist.  General instructions   Take over-the-counter and prescription medicines only as told by your health care provider.   Follow instructions from your health care provider about eating or drinking restrictions. You may need to avoid caffeine.   Avoid things that you know you are allergic to (allergens), like dust, mold, pollen, pets, or certain foods.   Drink enough fluid to keep your urine pale yellow.   Keep all follow-up visits as told by your health care provider. This is important.  Contact a health care provider if:   You have a fever.   You have a sore throat.   You have difficulty swallowing.   You have headache.   You have sinus pain.   You have a cough that does not go away.   The mucus from your nose becomes thick and is green or yellow in color.   You have  cold or flu symptoms that last more than 10 days.  Summary   Postnasal drip is the feeling of mucus going down the back of your throat.   If your health care provider approves, use nasal irrigation or a nasal spray 2?4 times a day.   Avoid things that you know you are allergic to (allergens), like dust, mold, pollen, pets, or certain foods.  This information is not intended to replace advice given to you by your health care provider. Make sure you discuss any questions you have with your health care provider.  Document Released: 12/24/2016 Document Revised: 12/24/2016 Document Reviewed: 12/24/2016  Elsevier Interactive Patient Education  2019 [employer]

## 2018-10-30 LAB — BMP8+EGFR
BUN/Creatinine Ratio: 12 (ref 12–28)
BUN: 11 mg/dL (ref 8–27)
CO2: 24 mmol/L (ref 20–29)
Calcium: 9.2 mg/dL (ref 8.7–10.3)
Chloride: 101 mmol/L (ref 96–106)
Creatinine, Ser: 0.91 mg/dL (ref 0.57–1.00)
GFR calc Af Amer: 77 mL/min/{1.73_m2} (ref >59)
GFR calc non Af Amer: 67 mL/min/{1.73_m2} (ref >59)
Glucose: 99 mg/dL (ref 65–99)
Potassium: 4.3 mmol/L (ref 3.5–5.2)
Sodium: 141 mmol/L (ref 134–144)

## 2018-10-30 LAB — HEMOGLOBIN A1C
Est. average glucose Bld gHb Est-mCnc: 128 mg/dL
Hgb A1c MFr Bld: 6.1 % — ABNORMAL HIGH (ref 4.8–5.6)

## 2018-11-01 NOTE — Progress Notes (Signed)
c Subjective:     Patient ID: Felicia Garrett , female    DOB: [birth year] , 63 y.o.   MRN: [id number]   Chief Complaint  Patient presents with  . Diabetes  . Hypertension    HPI  Diabetes  She presents for her follow-up diabetic visit. She has type 2 diabetes mellitus. Her disease course has been stable. There are no hypoglycemic associated symptoms. Pertinent negatives for diabetes include no blurred vision and no chest pain. There are no hypoglycemic complications. Risk factors for coronary artery disease include diabetes mellitus, dyslipidemia, hypertension, obesity and post-menopausal. She participates in exercise every other day. Eye exam is current.  Hypertension  This is a chronic problem. The current episode started more than 1 year ago. The problem has been gradually improving since onset. The problem is controlled. Pertinent negatives include no blurred vision, chest pain, palpitations or shortness of breath. There are no compliance problems.    She reports compliance with meds.   Past Medical History:  Diagnosis Date  . Anemia   . Arthritis   . Depression   . Diabetes mellitus   . Hyperlipidemia   . Thyroid nodule      Family History  Problem Relation Age of Onset  . Hypertension Mother   . Hyperlipidemia Mother   . Hyperthyroidism Mother   . Diabetes Father      Current Outpatient Medications:  .  buPROPion (WELLBUTRIN XL) 150 MG 24 hr tablet, Take 150 mg by mouth every morning., Disp: , Rfl:  .  Iron-FA-B Cmp-C-Biot-Probiotic (FUSION PLUS) CAPS, TAKE 1 CAPSULE BY ORAL ROUTE EVERY DAY BETWEEN MEALS, Disp: 30 capsule, Rfl: 1 .  levothyroxine (SYNTHROID, LEVOTHROID) 100 MCG tablet, Take 1 tablet (100 mcg total) by mouth daily before breakfast., Disp: 60 tablet, Rfl: 1 .  losartan-hydrochlorothiazide (HYZAAR) 50-12.5 MG tablet, losartan 50 mg-hydrochlorothiazide 12.5 mg tablet, Disp: , Rfl:  .  Semaglutide, 1 MG/DOSE, (OZEMPIC, 1 MG/DOSE,) 2 MG/1.5ML SOPN, Inject 1 mg  into the skin once a week. (Patient taking differently: Inject 1 mg into the skin daily. ), Disp: 3 pen, Rfl: 2 .  simvastatin (ZOCOR) 40 MG tablet, Take 40 mg by mouth at bedtime.  , Disp: , Rfl:  .  XIGDUO XR 01-999 MG TB24, TAKE 1 TABLET BY ORAL ROUTE 2 TIMES EVERY DAY IN THE MORNING WITH FOOD, Disp: 60 tablet, Rfl: 2   Allergies  Allergen Reactions  . Penicillins Shortness Of Breath    CHEST PAIN     Review of Systems  Constitutional: Negative.   HENT: Positive for hearing loss (hears "whooshing" noise in her ears) and postnasal drip.   Eyes: Negative for blurred vision.  Respiratory: Negative.  Negative for shortness of breath.   Cardiovascular: Negative.  Negative for chest pain and palpitations.  Gastrointestinal: Negative.   Neurological: Negative.   Psychiatric/Behavioral: Negative.      Today's Vitals   10/29/18 1422  BP: 116/74  Pulse: 76  Temp: 98.1 F (36.7 C)  TempSrc: Oral  Weight: 203 lb 9.6 oz (92.4 kg)  Height: 5' 10"  (1.778 m)   Body mass index is 29.21 kg/m.   Objective:  Physical Exam Vitals signs and nursing note reviewed.  Constitutional:      Appearance: Normal appearance.  HENT:     Head: Normocephalic and atraumatic.     Right Ear: External ear normal. There is impacted cerumen.     Left Ear: External ear normal. There is impacted cerumen.  Cardiovascular:  Rate and Rhythm: Normal rate and regular rhythm.     Heart sounds: Normal heart sounds.  Pulmonary:     Effort: Pulmonary effort is normal.     Breath sounds: Normal breath sounds.  Skin:    General: Skin is warm.  Neurological:     General: No focal deficit present.     Mental Status: She is alert.  Psychiatric:        Mood and Affect: Mood normal.         Assessment And Plan:     1. Controlled type 2 diabetes mellitus without complication, with long-term current use of insulin ([place])  I will check labs as listed below.  She is encouraged to continue with her regular  exercise regimen.   - BMP8+EGFR - Hemoglobin A1c  2. Essential hypertension, benign  Well controlled. She will continue with current meds. She is encouraged to avoid adding salt to her foods.   3. Postnasal drip  She will continue with use of OTC antihistamines. She will let me know if her sx persist. She should avoid dairy while she has symptoms.   4. Bilateral impacted cerumen  After obtaining verbal consent, there was an attempt to flush her ears by irrigation. The cerumen was too hard. She does not wish to try more attempts. She plans to go home and use a "kangaroo" to flush her ears.   - Ear Lavage        Maximino Greenland, MD

## 2018-11-02 ENCOUNTER — Encounter: Payer: Self-pay | Admitting: Internal Medicine

## 2018-11-03 ENCOUNTER — Other Ambulatory Visit: Payer: Self-pay | Admitting: Internal Medicine

## 2018-11-03 ENCOUNTER — Other Ambulatory Visit: Payer: Self-pay | Admitting: Surgery

## 2018-11-03 DIAGNOSIS — Z4651 Encounter for fitting and adjustment of gastric lap band: Secondary | ICD-10-CM

## 2018-11-03 MED ORDER — CHLORPHEN-PE-ACETAMINOPHEN 4-10-325 MG PO TABS: 4.0000 mg | ORAL_TABLET | Freq: Two times a day (BID) | ORAL | 0 refills | Status: DC | PRN

## 2018-11-04 ENCOUNTER — Encounter: Payer: Self-pay | Admitting: Internal Medicine

## 2018-11-04 ENCOUNTER — Other Ambulatory Visit: Payer: Self-pay

## 2018-11-04 ENCOUNTER — Ambulatory Visit: Payer: BC Managed Care – PPO | Admitting: Internal Medicine

## 2018-11-04 VITALS — BP 116/76 | HR 78 | Temp 97.8°F

## 2018-11-04 DIAGNOSIS — H6121 Impacted cerumen, right ear: Secondary | ICD-10-CM | POA: Diagnosis not present

## 2018-11-09 ENCOUNTER — Encounter: Payer: Self-pay | Admitting: Internal Medicine

## 2018-11-09 NOTE — Progress Notes (Signed)
Subjective:     Patient ID: Felicia Garrett , female    DOB: 1956/08/24 , 63 y.o.   MRN: [id number]   Chief Complaint  Patient presents with  . Hearing Problem    HPI  She walks in today c/o hearing loss.  She reports she cannot hear and ears must be flushed today. She was in the office one week ago for diabetes check. It was attempted to flush her ears at that visit, but the cerumen was too hard. Left ear was irrigated, but right ear was unsuccessful.  She has no other concerns.     Past Medical History:  Diagnosis Date  . Anemia   . Arthritis   . Depression   . Diabetes mellitus   . Hyperlipidemia   . Thyroid nodule      Family History  Problem Relation Age of Onset  . Hypertension Mother   . Hyperlipidemia Mother   . Hyperthyroidism Mother   . Diabetes Father      Current Outpatient Medications:  .  buPROPion (WELLBUTRIN XL) 150 MG 24 hr tablet, Take 150 mg by mouth every morning., Disp: , Rfl:  .  Chlorphen-PE-Acetaminophen (NOREL AD) 4-10-325 MG TABS, Take 4-10 mg by mouth 2 (two) times daily as needed., Disp: 20 tablet, Rfl: 0 .  Iron-FA-B Cmp-C-Biot-Probiotic (FUSION PLUS) CAPS, TAKE 1 CAPSULE BY ORAL ROUTE EVERY DAY BETWEEN MEALS, Disp: 30 capsule, Rfl: 1 .  levothyroxine (SYNTHROID, LEVOTHROID) 100 MCG tablet, Take 1 tablet (100 mcg total) by mouth daily before breakfast., Disp: 60 tablet, Rfl: 1 .  losartan-hydrochlorothiazide (HYZAAR) 50-12.5 MG tablet, losartan 50 mg-hydrochlorothiazide 12.5 mg tablet, Disp: , Rfl:  .  Semaglutide, 1 MG/DOSE, (OZEMPIC, 1 MG/DOSE,) 2 MG/1.5ML SOPN, Inject 1 mg into the skin once a week. (Patient taking differently: Inject 1 mg into the skin daily. ), Disp: 3 pen, Rfl: 2 .  simvastatin (ZOCOR) 40 MG tablet, Take 40 mg by mouth at bedtime.  , Disp: , Rfl:  .  XIGDUO XR 01-999 MG TB24, TAKE 1 TABLET BY ORAL ROUTE 2 TIMES EVERY DAY IN THE MORNING WITH FOOD, Disp: 60 tablet, Rfl: 2   Allergies  Allergen Reactions  . Penicillins  Shortness Of Breath    CHEST PAIN     Review of Systems  Constitutional: Negative.   HENT: Positive for hearing loss.   Respiratory: Negative.   Cardiovascular: Negative.   Gastrointestinal: Negative.   Neurological: Negative.   Psychiatric/Behavioral: Negative.      Today's Vitals   11/04/18 1138  BP: 116/76  Pulse: 78  Temp: 97.8 F (36.6 C)  TempSrc: Oral   There is no height or weight on file to calculate BMI.   Objective:  Physical Exam Vitals signs and nursing note reviewed.  Constitutional:      Appearance: Normal appearance. She is obese.  HENT:     Head: Normocephalic and atraumatic.     Right Ear: External ear normal. There is impacted cerumen.     Left Ear: Tympanic membrane, ear canal and external ear normal.  Cardiovascular:     Rate and Rhythm: Normal rate and regular rhythm.     Heart sounds: Normal heart sounds.  Pulmonary:     Effort: Pulmonary effort is normal.     Breath sounds: Normal breath sounds.  Neurological:     General: No focal deficit present.     Mental Status: She is alert.  Psychiatric:        Mood and Affect: Mood  normal.         Assessment And Plan:     1. Impacted cerumen of right ear  After obtaining verbal consent, right ear was flushed by irrigation without any complications. She reports her hearing has returned. She does not have any pain. NO TM abnormalities were noted.   [name], MD

## 2018-11-19 ENCOUNTER — Other Ambulatory Visit: Payer: Self-pay | Admitting: Internal Medicine

## 2018-11-21 ENCOUNTER — Other Ambulatory Visit: Payer: Self-pay | Admitting: Nurse Practitioner

## 2018-11-22 ENCOUNTER — Other Ambulatory Visit: Payer: Self-pay | Admitting: Internal Medicine

## 2018-11-22 MED ORDER — BENZONATATE 100 MG PO CAPS: 100.0000 mg | ORAL_CAPSULE | Freq: Three times a day (TID) | ORAL | 1 refills | Status: AC | PRN

## 2018-12-14 ENCOUNTER — Other Ambulatory Visit: Payer: Self-pay | Admitting: Internal Medicine

## 2018-12-17 ENCOUNTER — Other Ambulatory Visit: Payer: Self-pay | Admitting: Internal Medicine

## 2019-02-03 ENCOUNTER — Other Ambulatory Visit: Payer: Self-pay | Admitting: Internal Medicine

## 2019-02-11 ENCOUNTER — Other Ambulatory Visit: Payer: Self-pay | Admitting: Internal Medicine

## 2019-03-07 ENCOUNTER — Other Ambulatory Visit: Payer: Self-pay | Admitting: Internal Medicine

## 2019-03-21 ENCOUNTER — Other Ambulatory Visit: Payer: Self-pay | Admitting: Internal Medicine

## 2019-03-24 ENCOUNTER — Other Ambulatory Visit: Payer: Self-pay | Admitting: Internal Medicine

## 2019-04-15 ENCOUNTER — Encounter: Payer: Self-pay | Admitting: Internal Medicine

## 2019-04-15 ENCOUNTER — Other Ambulatory Visit: Payer: Self-pay

## 2019-04-15 ENCOUNTER — Ambulatory Visit: Payer: BC Managed Care – PPO | Admitting: Internal Medicine

## 2019-04-15 VITALS — BP 124/78 | HR 78 | Temp 98.2°F | Ht 68.4 in | Wt 213.8 lb

## 2019-04-15 DIAGNOSIS — E119 Type 2 diabetes mellitus without complications: Secondary | ICD-10-CM

## 2019-04-15 DIAGNOSIS — Z0000 Encounter for general adult medical examination without abnormal findings: Secondary | ICD-10-CM

## 2019-04-15 DIAGNOSIS — I10 Essential (primary) hypertension: Secondary | ICD-10-CM

## 2019-04-15 LAB — POCT URINALYSIS DIPSTICK
Bilirubin, UA: NEGATIVE
Blood, UA: NEGATIVE
Glucose, UA: POSITIVE — AB
Ketones, UA: NEGATIVE
Leukocytes, UA: NEGATIVE
Nitrite, UA: NEGATIVE
Protein, UA: NEGATIVE
Spec Grav, UA: 1.025 (ref 1.010–1.025)
Urobilinogen, UA: 0.2 E.U./dL
pH, UA: 5.5 (ref 5.0–8.0)

## 2019-04-15 LAB — POCT UA - MICROALBUMIN
Albumin/Creatinine Ratio, Urine, POC: <30
Creatinine, POC: 300 mg/dL
Microalbumin Ur, POC: 10 mg/L

## 2019-04-15 MED ORDER — OZEMPIC (1 MG/DOSE) 2 MG/1.5ML ~~LOC~~ SOPN: 1.0000 mg | PEN_INJECTOR | SUBCUTANEOUS | 2 refills | Status: DC

## 2019-04-15 NOTE — Patient Instructions (Signed)
Health Maintenance, Female Adopting a healthy lifestyle and getting preventive care are important in promoting health and wellness. Ask your health care provider about:  The right schedule for you to have regular tests and exams.  Things you can do on your own to prevent diseases and keep yourself healthy. What should I know about diet, weight, and exercise? Eat a healthy diet   Eat a diet that includes plenty of vegetables, fruits, low-fat dairy products, and lean protein.  Do not eat a lot of foods that are high in solid fats, added sugars, or sodium. Maintain a healthy weight Body mass index (BMI) is used to identify weight problems. It estimates body fat based on height and weight. Your health care provider can help determine your BMI and help you achieve or maintain a healthy weight. Get regular exercise Get regular exercise. This is one of the most important things you can do for your health. Most adults should:  Exercise for at least 150 minutes each week. The exercise should increase your heart rate and make you sweat (moderate-intensity exercise).  Do strengthening exercises at least twice a week. This is in addition to the moderate-intensity exercise.  Spend less time sitting. Even light physical activity can be beneficial. Watch cholesterol and blood lipids Have your blood tested for lipids and cholesterol at 65 years of age, then have this test every 5 years. Have your cholesterol levels checked more often if:  Your lipid or cholesterol levels are high.  You are older than 65 years of age.  You are at high risk for heart disease. What should I know about cancer screening? Depending on your health history and family history, you may need to have cancer screening at various ages. This may include screening for:  Breast cancer.  Cervical cancer.  Colorectal cancer.  Skin cancer.  Lung cancer. What should I know about heart disease, diabetes, and high blood  pressure? Blood pressure and heart disease  High blood pressure causes heart disease and increases the risk of stroke. This is more likely to develop in people who have high blood pressure readings, are of African descent, or are overweight.  Have your blood pressure checked: ? Every 3-5 years if you are 18-39 years of age. ? Every year if you are 40 years old or older. Diabetes Have regular diabetes screenings. This checks your fasting blood sugar level. Have the screening done:  Once every three years after age 40 if you are at a normal weight and have a low risk for diabetes.  More often and at a younger age if you are overweight or have a high risk for diabetes. What should I know about preventing infection? Hepatitis B If you have a higher risk for hepatitis B, you should be screened for this virus. Talk with your health care provider to find out if you are at risk for hepatitis B infection. Hepatitis C Testing is recommended for:  Everyone born from 1945 through 1965.  Anyone with known risk factors for hepatitis C. Sexually transmitted infections (STIs)  Get screened for STIs, including gonorrhea and chlamydia, if: ? You are sexually active and are younger than 65 years of age. ? You are older than 65 years of age and your health care provider tells you that you are at risk for this type of infection. ? Your sexual activity has changed since you were last screened, and you are at increased risk for chlamydia or gonorrhea. Ask your health care provider if   you are at risk.  Ask your health care provider about whether you are at high risk for HIV. Your health care provider may recommend a prescription medicine to help prevent HIV infection. If you choose to take medicine to prevent HIV, you should first get tested for HIV. You should then be tested every 3 months for as long as you are taking the medicine. Pregnancy  If you are about to stop having your period (premenopausal) and  you may become pregnant, seek counseling before you get pregnant.  Take 400 to 800 micrograms (mcg) of folic acid every day if you become pregnant.  Ask for birth control (contraception) if you want to prevent pregnancy. Osteoporosis and menopause Osteoporosis is a disease in which the bones lose minerals and strength with aging. This can result in bone fractures. If you are 65 years old or older, or if you are at risk for osteoporosis and fractures, ask your health care provider if you should:  Be screened for bone loss.  Take a calcium or vitamin D supplement to lower your risk of fractures.  Be given hormone replacement therapy (HRT) to treat symptoms of menopause. Follow these instructions at home: Lifestyle  Do not use any products that contain nicotine or tobacco, such as cigarettes, e-cigarettes, and chewing tobacco. If you need help quitting, ask your health care provider.  Do not use street drugs.  Do not share needles.  Ask your health care provider for help if you need support or information about quitting drugs. Alcohol use  Do not drink alcohol if: ? Your health care provider tells you not to drink. ? You are pregnant, may be pregnant, or are planning to become pregnant.  If you drink alcohol: ? Limit how much you use to 0-1 drink a day. ? Limit intake if you are breastfeeding.  Be aware of how much alcohol is in your drink. In the [place] one drink equals one 12 oz bottle of beer (355 mL), one 5 oz glass of wine (148 mL), or one 1 oz glass of hard liquor (44 mL). General instructions  Schedule regular health, dental, and eye exams.  Stay current with your vaccines.  Tell your health care provider if: ? You often feel depressed. ? You have ever been abused or do not feel safe at home. Summary  Adopting a healthy lifestyle and getting preventive care are important in promoting health and wellness.  Follow your health care provider's instructions about healthy  diet, exercising, and getting tested or screened for diseases.  Follow your health care provider's instructions on monitoring your cholesterol and blood pressure. This information is not intended to replace advice given to you by your health care provider. Make sure you discuss any questions you have with your health care provider. Document Released: 03/26/2011 Document Revised: 09/03/2018 Document Reviewed: 09/03/2018 Elsevier Patient Education  2020 Elsevier Inc.  

## 2019-04-15 NOTE — Progress Notes (Signed)
Subjective:     Patient ID: Felicia Garrett , female    DOB: 26-Mar-1956 , 63 y.o.   MRN: [id number]   Chief Complaint  Patient presents with  . Annual Exam  . Diabetes  . Hypertension    HPI  She is here today for a full physical examination. She is followed by Dr. Su Hiltoberts for her GYN exams.   Hypertension This is a chronic problem. The current episode started more than 1 year ago. The problem has been gradually improving since onset. The problem is controlled. Pertinent negatives include no blurred vision, chest pain, palpitations or shortness of breath. Risk factors for coronary artery disease include diabetes mellitus and dyslipidemia. Past treatments include angiotensin blockers. The current treatment provides moderate improvement.     Past Medical History:  Diagnosis Date  . Anemia   . Arthritis   . Depression   . Diabetes mellitus   . Hyperlipidemia   . Thyroid nodule      Family History  Problem Relation Age of Onset  . Hypertension Mother   . Hyperlipidemia Mother   . Hyperthyroidism Mother   . Diabetes Father      Current Outpatient Medications:  .  buPROPion (WELLBUTRIN XL) 150 MG 24 hr tablet, TAKE 1 TABLET BY MOUTH EVERY DAY, Disp: 90 tablet, Rfl: 2 .  Iron-FA-B Cmp-C-Biot-Probiotic (FUSION PLUS) CAPS, TAKE 1 CAPSULE BY ORAL ROUTE EVERY DAY BETWEEN MEALS, Disp: 30 capsule, Rfl: 1 .  levothyroxine (SYNTHROID, LEVOTHROID) 100 MCG tablet, Take 1 tablet (100 mcg total) by mouth daily before breakfast., Disp: 60 tablet, Rfl: 1 .  losartan-hydrochlorothiazide (HYZAAR) 50-12.5 MG tablet, TAKE 1 TABLET BY ORAL ROUTE EVERY DAY, Disp: 90 tablet, Rfl: 1 .  OZEMPIC, 1 MG/DOSE, 2 MG/1.5ML SOPN, INJECT 1 MG INTO THE SKIN ONCE A WEEK., Disp: 9 pen, Rfl: 2 .  simvastatin (ZOCOR) 40 MG tablet, Take 40 mg by mouth at bedtime.  , Disp: , Rfl:  .  XIGDUO XR 01-999 MG TB24, TAKE 1 TABLET BY ORAL ROUTE 2 TIMES EVERY DAY IN THE MORNING WITH FOOD, Disp: 60 tablet, Rfl: 2 .   benzonatate (TESSALON PERLES) 100 MG capsule, Take 1 capsule (100 mg total) by mouth 3 (three) times daily as needed for cough. (Patient not taking: Reported on 04/15/2019), Disp: 30 capsule, Rfl: 1 .  Chlorphen-PE-Acetaminophen (NOREL AD) 4-10-325 MG TABS, Take 4-10 mg by mouth 2 (two) times daily as needed. (Patient not taking: Reported on 04/15/2019), Disp: 20 tablet, Rfl: 0   Allergies  Allergen Reactions  . Penicillins Shortness Of Breath    CHEST PAIN     The patient states she uses none for birth control. Last LMP was No LMP recorded. Patient is postmenopausal.. Negative for Dysmenorrhea Negative for: breast discharge, breast lump(s), breast pain and breast self exam. Associated symptoms include abnormal vaginal bleeding. Pertinent negatives include abnormal bleeding (hematology), anxiety, decreased libido, depression, difficulty falling sleep, dyspareunia, history of infertility, nocturia, sexual dysfunction, sleep disturbances, urinary incontinence, urinary urgency, vaginal discharge and vaginal itching. Diet regular.The patient states her exercise level is  moderate.  . The patient's tobacco use is:  Social History   Tobacco Use  Smoking Status Never Smoker  Smokeless Tobacco Never Used  . She has been exposed to passive smoke. The patient's alcohol use is:  Social History   Substance and Sexual Activity  Alcohol Use No  . Additional information: Last pap 09/16/18.  Review of Systems  Constitutional: Negative.   HENT: Negative.  Eyes: Negative.  Negative for blurred vision.  Respiratory: Negative.  Negative for shortness of breath.   Cardiovascular: Negative.  Negative for chest pain and palpitations.  Endocrine: Negative.   Genitourinary: Negative.   Musculoskeletal: Negative.   Skin: Negative.   Allergic/Immunologic: Negative.   Neurological: Negative.   Hematological: Negative.   Psychiatric/Behavioral: Negative.      Today's Vitals   04/15/19 1055  BP: 124/78   Pulse: 78  Temp: 98.2 F (36.8 C)  TempSrc: Oral  Weight: 213 lb 12.8 oz (97 kg)  Height: 5' 8.4" (1.737 m)   Body mass index is 32.13 kg/m.   Objective:  Physical Exam Vitals signs and nursing note reviewed.  Constitutional:      Appearance: Normal appearance.  HENT:     Head: Normocephalic and atraumatic.     Right Ear: Tympanic membrane, ear canal and external ear normal.     Left Ear: Tympanic membrane, ear canal and external ear normal.     Nose: Nose normal.     Mouth/Throat:     Mouth: Mucous membranes are moist.     Pharynx: Oropharynx is clear.  Eyes:     Extraocular Movements: Extraocular movements intact.     Conjunctiva/sclera: Conjunctivae normal.     Pupils: Pupils are equal, round, and reactive to light.  Neck:     Musculoskeletal: Normal range of motion and neck supple.  Cardiovascular:     Rate and Rhythm: Normal rate and regular rhythm.     Pulses: Normal pulses.          Dorsalis pedis pulses are 2+ on the right side and 2+ on the left side.     Heart sounds: Normal heart sounds.  Pulmonary:     Effort: Pulmonary effort is normal.     Breath sounds: Normal breath sounds.  Chest:     Breasts: Tanner Score is 5.        Right: Normal. No swelling, bleeding, inverted nipple, mass, nipple discharge or skin change.        Left: Normal. No swelling, bleeding, inverted nipple, mass, nipple discharge or skin change.     Comments: Pendulous  Abdominal:     General: Abdomen is flat. Bowel sounds are normal.     Palpations: Abdomen is soft.  Genitourinary:    Comments: deferred Musculoskeletal: Normal range of motion.  Feet:     Right foot:     Protective Sensation: 5 sites tested. 5 sites sensed.     Skin integrity: Skin integrity normal.     Toenail Condition: Right toenails are normal.     Left foot:     Protective Sensation: 5 sites tested. 5 sites sensed.     Skin integrity: Skin integrity normal.     Toenail Condition: Left toenails are normal.   Skin:    General: Skin is warm and dry.  Neurological:     General: No focal deficit present.     Mental Status: She is alert and oriented to person, place, and time.  Psychiatric:        Mood and Affect: Mood normal.        Behavior: Behavior normal.         Assessment And Plan:     1. Routine general medical examination at health care facility  A full exam was performed. Importance of monthly self breast exams was stressed to the patient. PATIENT HAS BEEN ADVISED TO GET 30-45 MINUTES REGULAR EXERCISE NO LESS THAN FOUR  TO FIVE DAYS PER WEEK - BOTH WEIGHTBEARING EXERCISES AND AEROBIC ARE RECOMMENDED.  SHE IS ADVISED TO FOLLOW A HEALTHY DIET WITH AT LEAST SIX FRUITS/VEGGIES PER DAY, DECREASE INTAKE OF RED MEAT, AND TO INCREASE FISH INTAKE TO TWO DAYS PER WEEK.  MEATS/FISH SHOULD NOT BE FRIED, BAKED OR BROILED IS PREFERABLE.  I SUGGEST WEARING SPF 50 SUNSCREEN ON EXPOSED PARTS AND ESPECIALLY WHEN IN THE DIRECT SUNLIGHT FOR AN EXTENDED PERIOD OF TIME.  PLEASE AVOID FAST FOOD RESTAURANTS AND INCREASE YOUR WATER INTAKE.    2. Controlled type 2 diabetes mellitus without complication, without long-term current use of insulin ([place])  Diabetic foot exam was performed. I DISCUSSED WITH THE PATIENT AT LENGTH REGARDING THE GOALS OF GLYCEMIC CONTROL AND POSSIBLE LONG-TERM COMPLICATIONS.  I  ALSO STRESSED THE IMPORTANCE OF COMPLIANCE WITH HOME GLUCOSE MONITORING, DIETARY RESTRICTIONS INCLUDING AVOIDANCE OF SUGARY DRINKS/PROCESSED FOODS,  ALONG WITH REGULAR EXERCISE.  I  ALSO STRESSED THE IMPORTANCE OF ANNUAL EYE EXAMS, SELF FOOT CARE AND COMPLIANCE WITH OFFICE VISITS.   3. Essential hypertension, benign  Well controlled. She will continue with current meds. She is encouraged to avoid adding salt to her foods. EKG performed, no new changes noted.   - EKG 12-Lead        Maximino Greenland, MD    THE PATIENT IS ENCOURAGED TO PRACTICE SOCIAL DISTANCING DUE TO THE COVID-19 PANDEMIC.

## 2019-04-16 LAB — CBC
Hematocrit: 43.2 % (ref 34.0–46.6)
Hemoglobin: 12.4 g/dL (ref 11.1–15.9)
MCH: 20.6 pg — ABNORMAL LOW (ref 26.6–33.0)
MCHC: 28.7 g/dL — ABNORMAL LOW (ref 31.5–35.7)
MCV: 72 fL — ABNORMAL LOW (ref 79–97)
Platelets: 301 10*3/uL (ref 150–450)
RBC: 6.03 x10E6/uL — ABNORMAL HIGH (ref 3.77–5.28)
RDW: 16.1 % — ABNORMAL HIGH (ref 11.7–15.4)
WBC: 3.7 10*3/uL (ref 3.4–10.8)

## 2019-04-16 LAB — HEMOGLOBIN A1C
Est. average glucose Bld gHb Est-mCnc: 128 mg/dL
Hgb A1c MFr Bld: 6.1 % — ABNORMAL HIGH (ref 4.8–5.6)

## 2019-04-16 LAB — LIPID PANEL
Chol/HDL Ratio: 2 ratio (ref 0.0–4.4)
Cholesterol, Total: 184 mg/dL (ref 100–199)
HDL: 91 mg/dL (ref >39)
LDL Calculated: 84 mg/dL (ref 0–99)
Triglycerides: 45 mg/dL (ref 0–149)
VLDL Cholesterol Cal: 9 mg/dL (ref 5–40)

## 2019-04-16 LAB — CMP14+EGFR
ALT: 18 IU/L (ref 0–32)
AST: 15 IU/L (ref 0–40)
Albumin/Globulin Ratio: 1.7 (ref 1.2–2.2)
Albumin: 4.3 g/dL (ref 3.8–4.8)
Alkaline Phosphatase: 53 IU/L (ref 39–117)
BUN/Creatinine Ratio: 13 (ref 12–28)
BUN: 12 mg/dL (ref 8–27)
Bilirubin Total: 0.6 mg/dL (ref 0.0–1.2)
CO2: 26 mmol/L (ref 20–29)
Calcium: 9.6 mg/dL (ref 8.7–10.3)
Chloride: 101 mmol/L (ref 96–106)
Creatinine, Ser: 0.94 mg/dL (ref 0.57–1.00)
GFR calc Af Amer: 74 mL/min/{1.73_m2} (ref >59)
GFR calc non Af Amer: 64 mL/min/{1.73_m2} (ref >59)
Globulin, Total: 2.5 g/dL (ref 1.5–4.5)
Glucose: 99 mg/dL (ref 65–99)
Potassium: 4.3 mmol/L (ref 3.5–5.2)
Sodium: 143 mmol/L (ref 134–144)
Total Protein: 6.8 g/dL (ref 6.0–8.5)

## 2019-04-16 LAB — TSH: TSH: 5.32 u[IU]/mL — ABNORMAL HIGH (ref 0.450–4.500)

## 2019-04-16 LAB — T4, FREE: Free T4: 1.74 ng/dL (ref 0.82–1.77)

## 2019-04-17 ENCOUNTER — Encounter: Payer: Self-pay | Admitting: Internal Medicine

## 2019-04-18 ENCOUNTER — Encounter: Payer: Self-pay | Admitting: Internal Medicine

## 2019-04-20 ENCOUNTER — Other Ambulatory Visit: Payer: Self-pay

## 2019-04-21 ENCOUNTER — Encounter: Payer: Self-pay | Admitting: Internal Medicine

## 2019-04-23 ENCOUNTER — Other Ambulatory Visit: Payer: Self-pay

## 2019-04-23 MED ORDER — LEVOTHYROXINE SODIUM 150 MCG PO TABS: ORAL_TABLET | ORAL | 1 refills | Status: DC

## 2019-04-29 ENCOUNTER — Ambulatory Visit: Payer: Self-pay

## 2019-04-29 DIAGNOSIS — I10 Essential (primary) hypertension: Secondary | ICD-10-CM

## 2019-04-29 DIAGNOSIS — E119 Type 2 diabetes mellitus without complications: Secondary | ICD-10-CM

## 2019-04-29 NOTE — Chronic Care Management (AMB) (Signed)
   Care Management   Outreach Note  04/29/2019 Name: Felicia Garrett MRN: [id number] DOB: July 06, 1956  Referred by: Glendale Chard, MD Reason for referral : Care Coordination   An unsuccessful telephone outreach was attempted today. The patient was referred to the case management team by for assistance with chronic care management and care coordination.   Follow Up Plan: A HIPPA compliant phone message was left for the patient providing contact information and requesting a return call.  The care management team will reach out to the patient again over the next 10 days.   Daneen Schick, BSW, CDP Social Worker, Certified Dementia Practitioner [facility] [phone]

## 2019-05-07 ENCOUNTER — Ambulatory Visit: Payer: Self-pay

## 2019-05-07 DIAGNOSIS — I10 Essential (primary) hypertension: Secondary | ICD-10-CM

## 2019-05-07 DIAGNOSIS — E119 Type 2 diabetes mellitus without complications: Secondary | ICD-10-CM

## 2019-05-07 NOTE — Chronic Care Management (AMB) (Signed)
   Care Management   Outreach Note  05/07/2019 Name: Felicia Garrett MRN: [id number] DOB: [birth year]  Referred by: Glendale Chard, MD Reason for referral : Care Coordination   A second unsuccessful telephone outreach was attempted today. The patient was referred to the case management team for assistance with chronic care management and care coordination.   Follow Up Plan: A HIPPA compliant phone message was left for the patient providing contact information and requesting a return call.  The care management team will reach out to the patient again over the next 10 days.   Daneen Schick, BSW, CDP Social Worker, Certified Dementia Practitioner [facility] [phone]

## 2019-05-13 ENCOUNTER — Telehealth: Payer: Self-pay

## 2019-05-15 ENCOUNTER — Ambulatory Visit: Payer: Self-pay

## 2019-05-15 DIAGNOSIS — E119 Type 2 diabetes mellitus without complications: Secondary | ICD-10-CM

## 2019-05-15 DIAGNOSIS — I10 Essential (primary) hypertension: Secondary | ICD-10-CM

## 2019-05-15 NOTE — Chronic Care Management (AMB) (Signed)
  Chronic Care Management   Outreach Note  05/15/2019 Name: Felicia Garrett MRN: [id number] DOB: 1956-03-17  Referred by: Glendale Chard, MD Reason for referral : Care Coordination   Third unsuccessful telephone outreach was attempted today. The patient was referred to the case management team for assistance with chronic care management and care coordination. The patient's primary care provider has been notified of our unsuccessful attempts to make or maintain contact with the patient. The care management team is pleased to engage with this patient at any time in the future should he/she be interested in assistance from the care management team.    Daneen Schick, BSW, CDP Social Worker, Certified Dementia Practitioner [facility] [phone]

## 2019-05-28 ENCOUNTER — Encounter: Payer: Self-pay | Admitting: Podiatry

## 2019-05-28 ENCOUNTER — Ambulatory Visit (INDEPENDENT_AMBULATORY_CARE_PROVIDER_SITE_OTHER): Payer: Medicare Other | Admitting: Internal Medicine

## 2019-05-28 ENCOUNTER — Other Ambulatory Visit: Payer: Self-pay

## 2019-05-28 ENCOUNTER — Ambulatory Visit: Payer: Medicare Other

## 2019-05-28 ENCOUNTER — Encounter: Payer: Self-pay | Admitting: Internal Medicine

## 2019-05-28 ENCOUNTER — Ambulatory Visit: Payer: Medicare Other | Admitting: Podiatry

## 2019-05-28 VITALS — BP 128/62 | HR 77 | Temp 98.0°F | Ht 68.2 in | Wt 218.0 lb

## 2019-05-28 DIAGNOSIS — L600 Ingrowing nail: Secondary | ICD-10-CM | POA: Diagnosis not present

## 2019-05-28 DIAGNOSIS — M25571 Pain in right ankle and joints of right foot: Secondary | ICD-10-CM

## 2019-05-28 DIAGNOSIS — M7751 Other enthesopathy of right foot: Secondary | ICD-10-CM

## 2019-05-28 DIAGNOSIS — E890 Postprocedural hypothyroidism: Secondary | ICD-10-CM | POA: Diagnosis not present

## 2019-05-28 DIAGNOSIS — E1165 Type 2 diabetes mellitus with hyperglycemia: Secondary | ICD-10-CM

## 2019-05-28 DIAGNOSIS — Z6832 Body mass index (BMI) 32.0-32.9, adult: Secondary | ICD-10-CM

## 2019-05-28 DIAGNOSIS — Z9089 Acquired absence of other organs: Secondary | ICD-10-CM

## 2019-05-28 DIAGNOSIS — E6609 Other obesity due to excess calories: Secondary | ICD-10-CM

## 2019-05-28 DIAGNOSIS — E119 Type 2 diabetes mellitus without complications: Secondary | ICD-10-CM

## 2019-05-28 DIAGNOSIS — M779 Enthesopathy, unspecified: Secondary | ICD-10-CM

## 2019-05-28 NOTE — Progress Notes (Signed)
Subjective:   Patient ID: Felicia Garrett, female   DOB: 63 y.o.   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

## 2019-05-28 NOTE — Progress Notes (Signed)
Subjective:     Patient ID: Felicia Garrett , female    DOB: 07/11/1956 , 63 y.o.   MRN: [id number]   Chief Complaint  Patient presents with  . Thyroid Problem    HPI  She is here today for a thyroid check.   She is now taking levothyroxine 147mcg M-F and 11mcg on Sat/Sun. She reports she does feel more energetic while on this regimen. She has no other major concerns. However, she would like to discuss the cost of her meds now that she is on Medicare.   Thyroid Problem Presents for follow-up visit. Patient reports no cold intolerance, constipation or depressed mood. The symptoms have been stable.     Past Medical History:  Diagnosis Date  . Anemia   . Arthritis   . Depression   . Diabetes mellitus   . Hyperlipidemia   . Thyroid nodule      Family History  Problem Relation Age of Onset  . Hypertension Mother   . Hyperlipidemia Mother   . Hyperthyroidism Mother   . Diabetes Father      Current Outpatient Medications:  .  benzonatate (TESSALON PERLES) 100 MG capsule, Take 1 capsule (100 mg total) by mouth 3 (three) times daily as needed for cough., Disp: 30 capsule, Rfl: 1 .  buPROPion (WELLBUTRIN XL) 150 MG 24 hr tablet, TAKE 1 TABLET BY MOUTH EVERY DAY, Disp: 90 tablet, Rfl: 2 .  Chlorphen-PE-Acetaminophen (NOREL AD) 4-10-325 MG TABS, Take 4-10 mg by mouth 2 (two) times daily as needed., Disp: 20 tablet, Rfl: 0 .  Iron-FA-B Cmp-C-Biot-Probiotic (FUSION PLUS) CAPS, TAKE 1 CAPSULE BY ORAL ROUTE EVERY DAY BETWEEN MEALS, Disp: 30 capsule, Rfl: 1 .  levothyroxine (SYNTHROID) 137 MCG tablet, Take 137 mcg by mouth daily before breakfast. Monday - Friday, Disp: , Rfl:  .  levothyroxine (SYNTHROID) 150 MCG tablet, Take 1 tablet by mouth Saturday - Sunday, Disp: 30 tablet, Rfl: 1 .  losartan-hydrochlorothiazide (HYZAAR) 50-12.5 MG tablet, TAKE 1 TABLET BY ORAL ROUTE EVERY DAY, Disp: 90 tablet, Rfl: 1 .  nystatin cream (MYCOSTATIN), APPLY TO AFFECTED AREA TWICE A DAY, Disp: , Rfl:  .   oxybutynin (DITROPAN) 5 MG tablet, oxybutynin chloride 5 mg tablet  TAKE 1 TABLET BY MOUTH TWICE A DAY AS NEEDED, Disp: , Rfl:  .  Semaglutide, 1 MG/DOSE, (OZEMPIC, 1 MG/DOSE,) 2 MG/1.5ML SOPN, Inject 1 mg into the skin once a week., Disp: 9 pen, Rfl: 2 .  simvastatin (ZOCOR) 40 MG tablet, Take 40 mg by mouth at bedtime.  , Disp: , Rfl:  .  XIGDUO XR 01-999 MG TB24, TAKE 1 TABLET BY ORAL ROUTE 2 TIMES EVERY DAY IN THE MORNING WITH FOOD, Disp: 60 tablet, Rfl: 2   Allergies  Allergen Reactions  . Penicillins Shortness Of Breath    CHEST PAIN     Review of Systems  Constitutional: Negative.   Respiratory: Negative.   Cardiovascular: Negative.   Gastrointestinal: Negative.  Negative for constipation.  Endocrine: Negative for cold intolerance.  Neurological: Negative.   Psychiatric/Behavioral: Negative.      Today's Vitals   05/28/19 1014  BP: 128/62  Pulse: 77  Temp: 98 F (36.7 C)  TempSrc: Oral  SpO2: 97%  Weight: 218 lb (98.9 kg)  Height: 5' 8.2" (1.732 m)   Body mass index is 32.95 kg/m.   Objective:  Physical Exam Vitals signs and nursing note reviewed.  Constitutional:      Appearance: Normal appearance.  HENT:  Head: Normocephalic and atraumatic.  Cardiovascular:     Rate and Rhythm: Normal rate and regular rhythm.     Heart sounds: Normal heart sounds.  Pulmonary:     Effort: Pulmonary effort is normal.     Breath sounds: Normal breath sounds.  Skin:    General: Skin is warm.  Neurological:     General: No focal deficit present.     Mental Status: She is alert.  Psychiatric:        Mood and Affect: Mood normal.        Behavior: Behavior normal.         Assessment And Plan:     1. Postsurgical hypothyroidism  I will check thyroid level and adjust meds as needed.  - TSH  2. 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 Controlled type 2 diabetes mellitus without complication, without long-term current use of insulin ([facility])  I will refer her  to CCM for medication assistance. Patient advised she should be able to get assistance for Ozempic and possibly Xigduo.   - Referral to Chronic Care Management Services    4. Class 1 obesity due to excess calories with serious comorbidity and body mass index (BMI) of 32.0 to 32.9 in adult  She is encouraged to continue with her regular exercise regimen. She is s/p lap band procedure. She is no longer getting fills at this time.        Gwynneth Alimentobyn N Quamere Mussell, MD    THE PATIENT IS ENCOURAGED TO PRACTICE SOCIAL DISTANCING DUE TO THE COVID-19 PANDEMIC.

## 2019-05-29 ENCOUNTER — Encounter: Payer: Self-pay | Admitting: Internal Medicine

## 2019-05-29 LAB — TSH: TSH: 0.47 u[IU]/mL (ref 0.450–4.500)

## 2019-06-01 DIAGNOSIS — E1165 Type 2 diabetes mellitus with hyperglycemia: Secondary | ICD-10-CM | POA: Insufficient documentation

## 2019-06-03 ENCOUNTER — Telehealth: Payer: Self-pay

## 2019-06-05 ENCOUNTER — Ambulatory Visit: Payer: Self-pay | Admitting: Pharmacist

## 2019-06-05 DIAGNOSIS — I10 Essential (primary) hypertension: Secondary | ICD-10-CM

## 2019-06-05 DIAGNOSIS — E119 Type 2 diabetes mellitus without complications: Secondary | ICD-10-CM

## 2019-06-05 NOTE — Progress Notes (Signed)
  Chronic Care Management   Outreach Note  06/05/2019 Name: Felicia Garrett MRN: [id number] DOB: 24-Nov-1955  Referred by: Glendale Chard, MD Reason for referral : Chronic Care Management   An unsuccessful telephone outreach was attempted today. The patient was referred to the case management team by for assistance with chronic care management and care coordination.   Follow Up Plan: A HIPPA compliant phone message was left for the patient providing contact information and requesting a return call.  The care management team will reach out to the patient again over the next 1-2 weeks.  Regina Eck, PharmD, BCPS Clinical Pharmacist, [facility] [place]: [phone]

## 2019-06-07 ENCOUNTER — Encounter: Payer: Self-pay | Admitting: Internal Medicine

## 2019-06-08 ENCOUNTER — Other Ambulatory Visit: Payer: Self-pay | Admitting: Internal Medicine

## 2019-06-08 ENCOUNTER — Ambulatory Visit: Payer: Self-pay | Admitting: Pharmacist

## 2019-06-08 DIAGNOSIS — E119 Type 2 diabetes mellitus without complications: Secondary | ICD-10-CM

## 2019-06-08 DIAGNOSIS — I10 Essential (primary) hypertension: Secondary | ICD-10-CM

## 2019-06-08 MED ORDER — SIMVASTATIN 40 MG PO TABS: 40.0000 mg | ORAL_TABLET | Freq: Every day | ORAL | 2 refills | Status: DC

## 2019-06-10 ENCOUNTER — Telehealth: Payer: Self-pay | Admitting: Pharmacist

## 2019-06-11 NOTE — Progress Notes (Signed)
  Chronic Care Management   Initial Visit Note  06/08/2019 Name: Felicia Garrett MRN: [id number] DOB: 01-14-1956  Referred by: Glendale Chard, MD Reason for referral : Chronic Care Management   Felicia Garrett is a 63 y.o. year old female who is a primary care patient of Glendale Chard, MD. The CCM team was consulted for assistance with chronic disease management and care coordination needs.   Review of patient status, including review of consultants reports, relevant laboratory and other test results, and collaboration with appropriate care team members and the patient's provider was performed as part of comprehensive patient evaluation and provision of chronic care management services.    I spoke with Felicia Garrett by telephone today.   Advanced Directives Status: N See Care Plan and Vyn[employer] application for related entries.   Medications: Outpatient Encounter Medications as of 06/08/2019  Medication Sig  . benzonatate (TESSALON PERLES) 100 MG capsule Take 1 capsule (100 mg total) by mouth 3 (three) times daily as needed for cough.  Marland Kitchen buPROPion (WELLBUTRIN XL) 150 MG 24 hr tablet TAKE 1 TABLET BY MOUTH EVERY DAY  . Chlorphen-PE-Acetaminophen (NOREL AD) 4-10-325 MG TABS Take 4-10 mg by mouth 2 (two) times daily as needed.  . Iron-FA-B Cmp-C-Biot-Probiotic (FUSION PLUS) CAPS TAKE 1 CAPSULE BY ORAL ROUTE EVERY DAY BETWEEN MEALS  . levothyroxine (SYNTHROID) 137 MCG tablet Take 137 mcg by mouth daily before breakfast. Monday - Friday  . levothyroxine (SYNTHROID) 150 MCG tablet Take 1 tablet by mouth Saturday - Sunday  . losartan-hydrochlorothiazide (HYZAAR) 50-12.5 MG tablet TAKE 1 TABLET BY ORAL ROUTE EVERY DAY  . nystatin cream (MYCOSTATIN) APPLY TO AFFECTED AREA TWICE A DAY  . oxybutynin (DITROPAN) 5 MG tablet oxybutynin chloride 5 mg tablet  TAKE 1 TABLET BY MOUTH TWICE A DAY AS NEEDED  . Semaglutide, 1 MG/DOSE, (OZEMPIC, 1 MG/DOSE,) 2 MG/1.5ML SOPN Inject 1 mg into the skin once a week.  .  simvastatin (ZOCOR) 40 MG tablet Take 1 tablet (40 mg total) by mouth at bedtime.  Marland Kitchen XIGDUO XR 01-999 MG TB24 TAKE 1 TABLET BY ORAL ROUTE 2 TIMES EVERY DAY IN THE MORNING WITH FOOD   No facility-administered encounter medications on file as of 06/08/2019.      Objective:   Goals Addressed   None      [name] was given information about Chronic Care Management services today including:  1. CCM service includes personalized support from designated clinical staff supervised by her physician, including individualized plan of care and coordination with other care providers 2. 24/7 contact phone numbers for assistance for urgent and routine care needs. 3. Service will only be billed when office clinical staff spend 20 minutes or more in a month to coordinate care. 4. Only one practitioner may furnish and bill the service in a calendar month. 5. The patient may stop CCM services at any time (effective at the end of the month) by phone call to the office staff. 6. The patient will be responsible for cost sharing (co-pay) of up to 20% of the service fee (after annual deductible is met).  Patient did not agree to services and wishes to consider information provided before deciding about enrollment in care management services.   Plan:   Face to Face appointment with care management team member scheduled for: 06/10/19  Felicia Garrett, PharmD, BCPS Clinical Pharmacist, [facility] [place]: [phone]

## 2019-06-12 ENCOUNTER — Telehealth: Payer: Self-pay

## 2019-06-12 ENCOUNTER — Other Ambulatory Visit: Payer: Self-pay | Admitting: Internal Medicine

## 2019-06-13 ENCOUNTER — Encounter: Payer: Self-pay | Admitting: Internal Medicine

## 2019-06-15 ENCOUNTER — Other Ambulatory Visit: Payer: Self-pay | Admitting: Internal Medicine

## 2019-06-23 ENCOUNTER — Ambulatory Visit: Payer: Self-pay | Admitting: Pharmacist

## 2019-06-23 ENCOUNTER — Telehealth: Payer: Self-pay

## 2019-06-23 NOTE — Progress Notes (Signed)
  Chronic Care Management   Visit Note  06/23/2019 Name: Genessa Beman MRN: [id number] DOB: [birth year]  Referred by: Glendale Chard, MD Reason for referral : Chronic care management   Rosalinda Seaman is a 63 y.o. year old female who is a primary care patient of Glendale Chard, MD. The CCM team was consulted for assistance with chronic disease management and care coordination needs.    Ms. Gugliotta was given information about Chronic Care Management services today including:  1. CCM service includes personalized support from designated clinical staff supervised by her physician, including individualized plan of care and coordination with other care providers 2. 24/7 contact phone numbers for assistance for urgent and routine care needs. 3. Service will only be billed when office clinical staff spend 20 minutes or more in a month to coordinate care. 4. Only one practitioner may furnish and bill the service in a calendar month. 5. The patient may stop CCM services at any time (effective at the end of the month) by phone call to the office staff. 6. The patient will be responsible for cost sharing (co-pay) of up to 20% of the service fee (after annual deductible is met).  Patient did not agree to enrollment in care management services and does not wish to consider at this time.  Unfortunately, patient's insurance ([employer] state health plan) requires $20 copay/month for CCM services.  Patient does not wish to submit PAP application at this time due to personal preference.  Plan:   No further follow up required: encouraged patient to reach out if CCM services needed in the future   Regina Eck, PharmD, [place] Pharmacist, [facility] [place]: [phone]

## 2019-07-13 ENCOUNTER — Telehealth: Payer: Self-pay

## 2019-07-13 NOTE — Telephone Encounter (Signed)
Pt informed to come get samples of xigduo

## 2019-07-14 ENCOUNTER — Encounter: Payer: Self-pay | Admitting: Internal Medicine

## 2019-08-25 ENCOUNTER — Encounter: Payer: Self-pay | Admitting: Internal Medicine

## 2019-08-25 ENCOUNTER — Ambulatory Visit: Payer: Medicare Other | Admitting: Internal Medicine

## 2019-08-25 ENCOUNTER — Other Ambulatory Visit: Payer: Self-pay

## 2019-08-25 VITALS — BP 128/74 | HR 82 | Temp 97.5°F | Ht 68.2 in | Wt 207.6 lb

## 2019-08-25 DIAGNOSIS — E890 Postprocedural hypothyroidism: Secondary | ICD-10-CM | POA: Diagnosis not present

## 2019-08-25 DIAGNOSIS — I10 Essential (primary) hypertension: Secondary | ICD-10-CM

## 2019-08-25 DIAGNOSIS — E6609 Other obesity due to excess calories: Secondary | ICD-10-CM

## 2019-08-25 DIAGNOSIS — E119 Type 2 diabetes mellitus without complications: Secondary | ICD-10-CM

## 2019-08-25 DIAGNOSIS — Z6831 Body mass index (BMI) 31.0-31.9, adult: Secondary | ICD-10-CM

## 2019-08-25 MED ORDER — LOSARTAN POTASSIUM-HCTZ 50-12.5 MG PO TABS: ORAL_TABLET | ORAL | 2 refills | Status: DC

## 2019-08-25 MED ORDER — BUPROPION HCL ER (XL) 150 MG PO TB24: 150.0000 mg | ORAL_TABLET | Freq: Every day | ORAL | 2 refills | Status: DC

## 2019-08-25 NOTE — Patient Instructions (Signed)
Cholesterol Content in Foods Cholesterol is a waxy, fat-like substance that helps to carry fat in the blood. The body needs cholesterol in small amounts, but too much cholesterol can cause damage to the arteries and heart. Most people should eat less than 200 milligrams (mg) of cholesterol a day. Foods with cholesterol  Cholesterol is found in animal-based foods, such as meat, seafood, and dairy. Generally, low-fat dairy and lean meats have less cholesterol than full-fat dairy and fatty meats. The milligrams of cholesterol per serving (mg per serving) of common cholesterol-containing foods are listed below. Meat and other proteins  Egg - one large whole egg has 186 mg.  Veal shank - 4 oz has 141 mg.  Lean ground turkey (93% lean) - 4 oz has 118 mg.  Fat-trimmed lamb loin - 4 oz has 106 mg.  Lean ground beef (90% lean) - 4 oz has 100 mg.  Lobster - 3.5 oz has 90 mg.  Pork loin chops - 4 oz has 86 mg.  Canned salmon - 3.5 oz has 83 mg.  Fat-trimmed beef top loin - 4 oz has 78 mg.  Frankfurter - 1 frank (3.5 oz) has 77 mg.  Crab - 3.5 oz has 71 mg.  Roasted chicken without skin, white meat - 4 oz has 66 mg.  Light bologna - 2 oz has 45 mg.  Deli-cut turkey - 2 oz has 31 mg.  Canned tuna - 3.5 oz has 31 mg.  Bacon - 1 oz has 29 mg.  Oysters and mussels (raw) - 3.5 oz has 25 mg.  Mackerel - 1 oz has 22 mg.  Trout - 1 oz has 20 mg.  Pork sausage - 1 link (1 oz) has 17 mg.  Salmon - 1 oz has 16 mg.  Tilapia - 1 oz has 14 mg. Dairy  Soft-serve ice cream -  cup (4 oz) has 103 mg.  Whole-milk yogurt - 1 cup (8 oz) has 29 mg.  Cheddar cheese - 1 oz has 28 mg.  American cheese - 1 oz has 28 mg.  Whole milk - 1 cup (8 oz) has 23 mg.  2% milk - 1 cup (8 oz) has 18 mg.  Cream cheese - 1 tablespoon (Tbsp) has 15 mg.  Cottage cheese -  cup (4 oz) has 14 mg.  Low-fat (1%) milk - 1 cup (8 oz) has 10 mg.  Sour cream - 1 Tbsp has 8.5 mg.  Low-fat yogurt - 1 cup  (8 oz) has 8 mg.  Nonfat Greek yogurt - 1 cup (8 oz) has 7 mg.  Half-and-half cream - 1 Tbsp has 5 mg. Fats and oils  Cod liver oil - 1 tablespoon (Tbsp) has 82 mg.  Butter - 1 Tbsp has 15 mg.  Lard - 1 Tbsp has 14 mg.  Bacon grease - 1 Tbsp has 14 mg.  Mayonnaise - 1 Tbsp has 5-10 mg.  Margarine - 1 Tbsp has 3-10 mg. Exact amounts of cholesterol in these foods may vary depending on specific ingredients and brands. Foods without cholesterol Most plant-based foods do not have cholesterol unless you combine them with a food that has cholesterol. Foods without cholesterol include:  Grains and cereals.  Vegetables.  Fruits.  Vegetable oils, such as olive, canola, and sunflower oil.  Legumes, such as peas, beans, and lentils.  Nuts and seeds.  Egg whites. Summary  The body needs cholesterol in small amounts, but too much cholesterol can cause damage to the arteries and heart.    Most people should eat less than 200 milligrams (mg) of cholesterol a day. This information is not intended to replace advice given to you by your health care provider. Make sure you discuss any questions you have with your health care provider. Document Released: 05/07/2017 Document Revised: 08/23/2017 Document Reviewed: 05/07/2017 Elsevier Patient Education  2020 Elsevier Inc.  

## 2019-08-26 LAB — BMP8+EGFR
BUN/Creatinine Ratio: 14 (ref 12–28)
BUN: 12 mg/dL (ref 8–27)
CO2: 26 mmol/L (ref 20–29)
Calcium: 9.3 mg/dL (ref 8.7–10.3)
Chloride: 100 mmol/L (ref 96–106)
Creatinine, Ser: 0.86 mg/dL (ref 0.57–1.00)
GFR calc Af Amer: 82 mL/min/{1.73_m2} (ref >59)
GFR calc non Af Amer: 71 mL/min/{1.73_m2} (ref >59)
Glucose: 110 mg/dL — ABNORMAL HIGH (ref 65–99)
Potassium: 4.1 mmol/L (ref 3.5–5.2)
Sodium: 140 mmol/L (ref 134–144)

## 2019-08-26 LAB — TSH: TSH: 3.67 u[IU]/mL (ref 0.450–4.500)

## 2019-08-26 LAB — HEMOGLOBIN A1C
Est. average glucose Bld gHb Est-mCnc: 131 mg/dL
Hgb A1c MFr Bld: 6.2 % — ABNORMAL HIGH (ref 4.8–5.6)

## 2019-08-30 ENCOUNTER — Encounter: Payer: Self-pay | Admitting: Internal Medicine

## 2019-08-30 NOTE — Progress Notes (Signed)
This visit occurred during the SARS-CoV-2 public health emergency.  Safety protocols were in place, including screening questions prior to the visit, additional usage of staff PPE, and extensive cleaning of exam room while observing appropriate contact time as indicated for disinfecting solutions.  Subjective:     Patient ID: Felicia Garrett , female    DOB: 12-Jun-1956 , 63 y.o.   MRN: [id number]   Chief Complaint  Patient presents with  . Diabetes  . Hypertension    HPI  Diabetes She presents for her follow-up diabetic visit. She has type 2 diabetes mellitus. Her disease course has been stable. There are no hypoglycemic associated symptoms. Pertinent negatives for diabetes include no blurred vision and no chest pain. There are no hypoglycemic complications. Risk factors for coronary artery disease include diabetes mellitus, dyslipidemia, hypertension, obesity and post-menopausal. She participates in exercise every other day. Eye exam is current.  Hypertension This is a chronic problem. The current episode started more than 1 year ago. The problem has been gradually improving since onset. The problem is controlled. Pertinent negatives include no blurred vision, chest pain, palpitations or shortness of breath. There are no compliance problems.      Past Medical History:  Diagnosis Date  . Anemia   . Arthritis   . Depression   . Diabetes mellitus   . Hyperlipidemia   . Thyroid nodule      Family History  Problem Relation Age of Onset  . Hypertension Mother   . Hyperlipidemia Mother   . Hyperthyroidism Mother   . Diabetes Father      Current Outpatient Medications:  .  benzonatate (TESSALON PERLES) 100 MG capsule, Take 1 capsule (100 mg total) by mouth 3 (three) times daily as needed for cough., Disp: 30 capsule, Rfl: 1 .  buPROPion (WELLBUTRIN XL) 150 MG 24 hr tablet, Take 1 tablet (150 mg total) by mouth daily., Disp: 90 tablet, Rfl: 2 .  Chlorphen-PE-Acetaminophen (NOREL AD)  4-10-325 MG TABS, Take 4-10 mg by mouth 2 (two) times daily as needed., Disp: 20 tablet, Rfl: 0 .  Iron-FA-B Cmp-C-Biot-Probiotic (FUSION PLUS) CAPS, TAKE 1 CAPSULE BY ORAL ROUTE EVERY DAY BETWEEN MEALS, Disp: 30 capsule, Rfl: 1 .  levothyroxine (SYNTHROID) 137 MCG tablet, Take 137 mcg by mouth daily before breakfast. Sunday - Friday, Disp: , Rfl:  .  levothyroxine (SYNTHROID) 150 MCG tablet, Take 1 tablet by mouth Saturday - Sunday (Patient taking differently: Take 1 tablet by mouth Saturday), Disp: 30 tablet, Rfl: 1 .  losartan-hydrochlorothiazide (HYZAAR) 50-12.5 MG tablet, TAKE 1 TABLET BY ORAL ROUTE EVERY DAY, Disp: 90 tablet, Rfl: 2 .  nystatin cream (MYCOSTATIN), APPLY TO AFFECTED AREA TWICE A DAY, Disp: , Rfl:  .  oxybutynin (DITROPAN) 5 MG tablet, oxybutynin chloride 5 mg tablet  TAKE 1 TABLET BY MOUTH TWICE A DAY AS NEEDED, Disp: , Rfl:  .  Semaglutide, 1 MG/DOSE, (OZEMPIC, 1 MG/DOSE,) 2 MG/1.5ML SOPN, Inject 1 mg into the skin once a week., Disp: 9 pen, Rfl: 2 .  simvastatin (ZOCOR) 40 MG tablet, Take 1 tablet (40 mg total) by mouth at bedtime., Disp: 90 tablet, Rfl: 2 .  XIGDUO XR 01-999 MG TB24, TAKE 1 TABLET BY ORAL ROUTE 2 TIMES EVERY DAY IN THE MORNING WITH FOOD, Disp: 60 tablet, Rfl: 2   Allergies  Allergen Reactions  . Penicillins Shortness Of Breath    CHEST PAIN     Review of Systems  Constitutional: Negative.   Eyes: Negative for blurred vision.  Respiratory: Negative.  Negative for shortness of breath.   Cardiovascular: Negative.  Negative for chest pain and palpitations.  Gastrointestinal: Negative.   Neurological: Negative.   Psychiatric/Behavioral: Negative.      Today's Vitals   08/25/19 1104  BP: 128/74  Pulse: 82  Temp: (!) 97.5 F (36.4 C)  TempSrc: Oral  Weight: 207 lb 9.6 oz (94.2 kg)  Height: 5' 8.2" (1.732 m)  PainSc: 0-No pain   Body mass index is 31.38 kg/m.   Objective:  Physical Exam Vitals signs and nursing note reviewed.   Constitutional:      Appearance: Normal appearance.  HENT:     Head: Normocephalic and atraumatic.  Cardiovascular:     Rate and Rhythm: Normal rate and regular rhythm.     Heart sounds: Normal heart sounds.  Pulmonary:     Effort: Pulmonary effort is normal.     Breath sounds: Normal breath sounds.  Skin:    General: Skin is warm.  Neurological:     General: No focal deficit present.     Mental Status: She is alert.  Psychiatric:        Mood and Affect: Mood normal.        Behavior: Behavior normal.         Assessment And Plan:     1. Controlled type 2 diabetes mellitus without complication, without long-term current use of insulin (HCC)  Chronic. She has been well controlled on current meds. She is encouraged to continue with her regular exercise regimen.   - Hemoglobin A1c - BMP8+EGFR  2. Essential hypertension, benign  Chronic, well controlled. She will continue with current meds. She is encouraged to avoid adding salt to her foods.   3. Postsurgical hypothyroidism  I will check thyroid panel and adjust meds as needed.  - TSH  4. Class 1 obesity due to excess calories with serious comorbidity and body mass index (BMI) of 31.0 to 31.9 in adult  She is encouraged to strive for BMI less than 28 to decrease cardiac risk. She has lost considerable amount of weight since lap band surgery in 2009. She is congratulated on maintenance of her weight loss.   Maximino Greenland, MD    THE PATIENT IS ENCOURAGED TO PRACTICE SOCIAL DISTANCING DUE TO THE COVID-19 PANDEMIC.

## 2019-09-14 ENCOUNTER — Other Ambulatory Visit: Payer: Self-pay | Admitting: Internal Medicine

## 2019-09-15 ENCOUNTER — Encounter: Payer: Self-pay | Admitting: Internal Medicine

## 2019-09-15 ENCOUNTER — Other Ambulatory Visit: Payer: Self-pay

## 2019-09-15 MED ORDER — LEVOTHYROXINE SODIUM 137 MCG PO TABS: 137.0000 ug | ORAL_TABLET | Freq: Every day | ORAL | 2 refills | Status: DC

## 2019-09-15 MED ORDER — LEVOTHYROXINE SODIUM 150 MCG PO TABS: ORAL_TABLET | ORAL | 1 refills | Status: DC

## 2019-09-27 ENCOUNTER — Other Ambulatory Visit: Payer: Self-pay | Admitting: Internal Medicine

## 2019-10-03 ENCOUNTER — Other Ambulatory Visit: Payer: Self-pay | Admitting: Internal Medicine

## 2019-12-07 ENCOUNTER — Other Ambulatory Visit: Payer: Self-pay | Admitting: Internal Medicine

## 2019-12-10 ENCOUNTER — Telehealth: Payer: Self-pay

## 2019-12-10 NOTE — Telephone Encounter (Signed)
Pt LVM to have appt rescheduled LVM for pt to call the office back someone can reschedule her appt for her

## 2019-12-14 ENCOUNTER — Ambulatory Visit: Payer: BC Managed Care – PPO | Admitting: Internal Medicine

## 2019-12-14 ENCOUNTER — Encounter: Payer: Medicare Other | Admitting: Internal Medicine

## 2020-01-01 IMAGING — DX DG CHEST 2V
2 series · 2 of 2 positions shown · non-contrast
Comparison: Radiographs October 05, 2014.

CLINICAL DATA: Cough.

EXAM:
CHEST - 2 VIEW

[dg chest 2 view (1 of 2)]
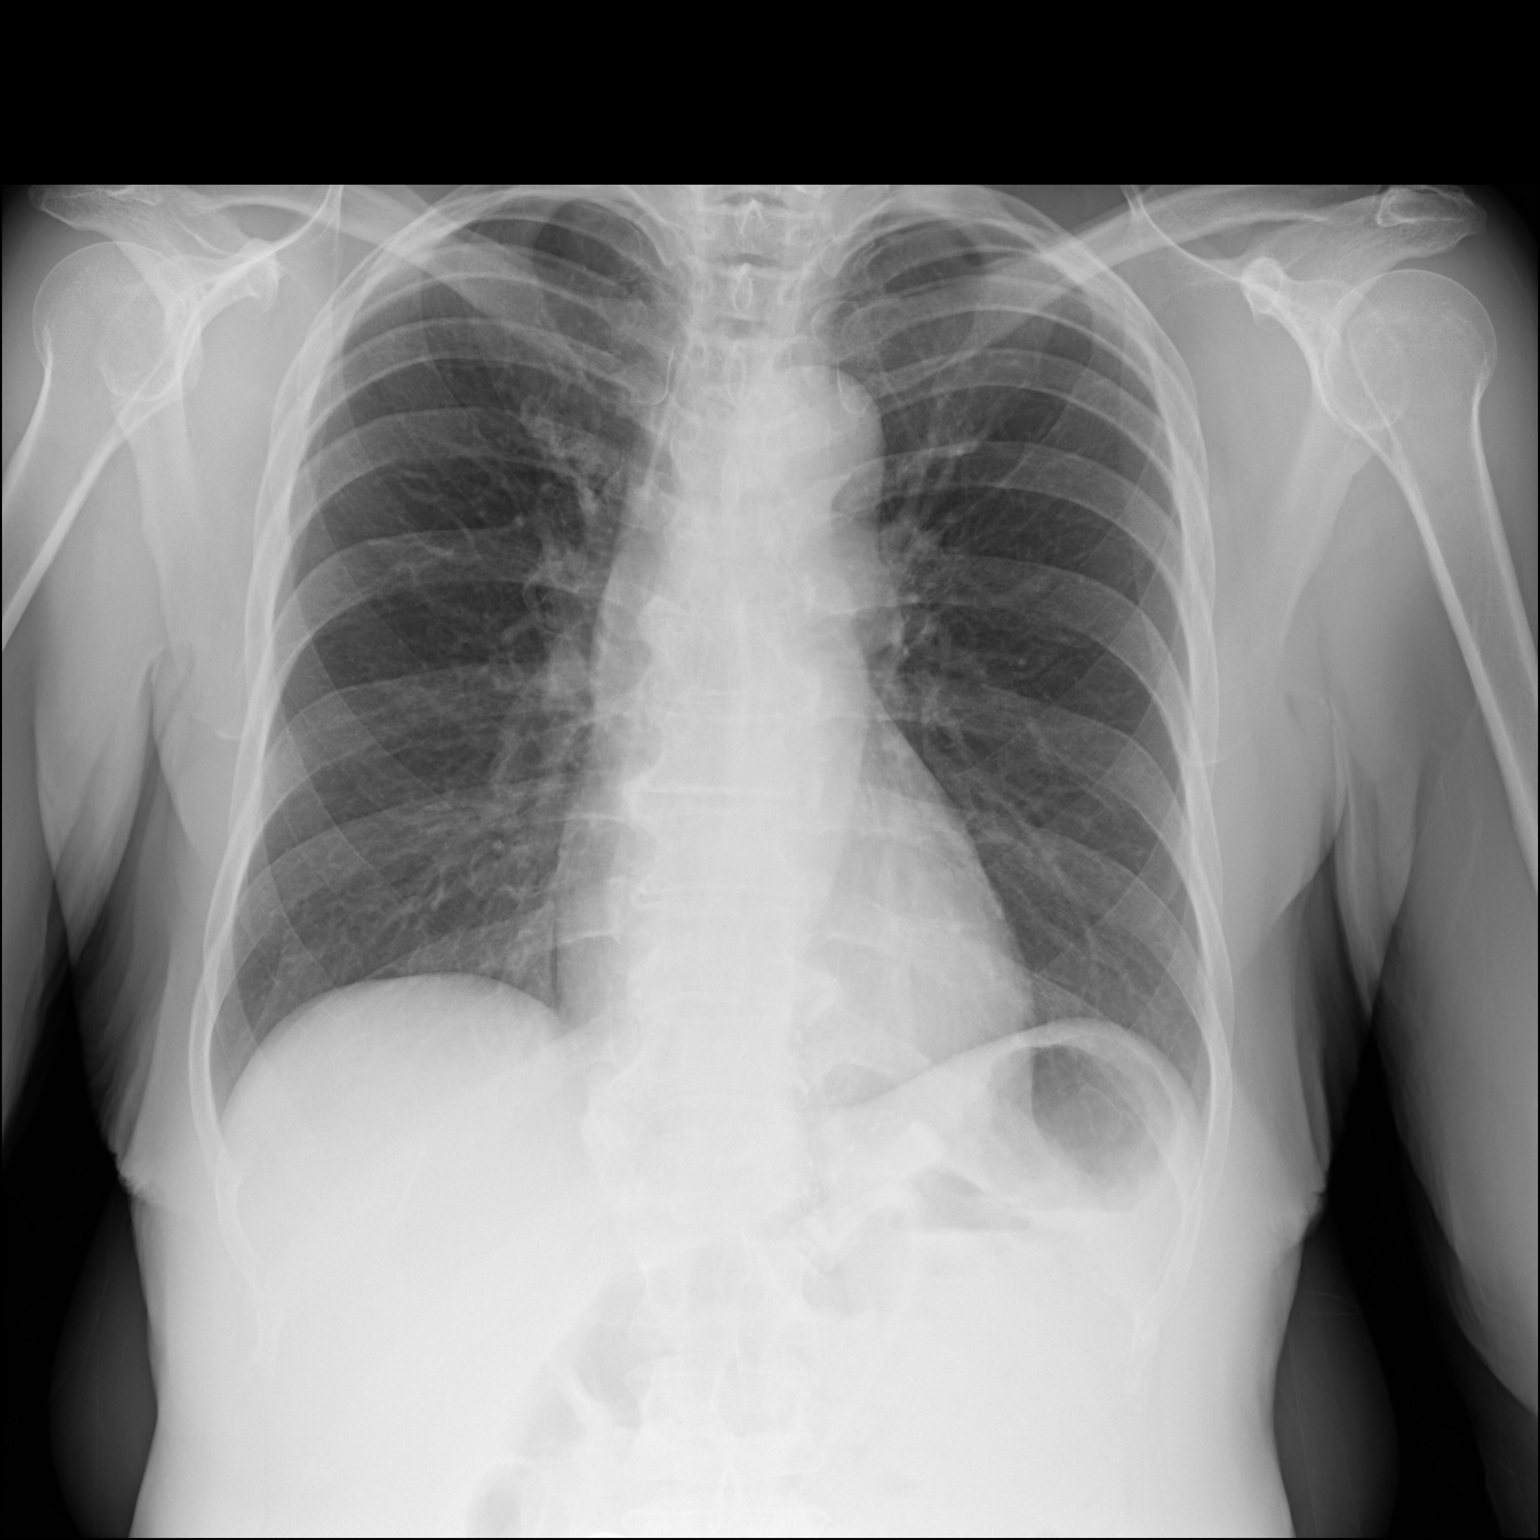

[dg chest 2 view (2 of 2)]
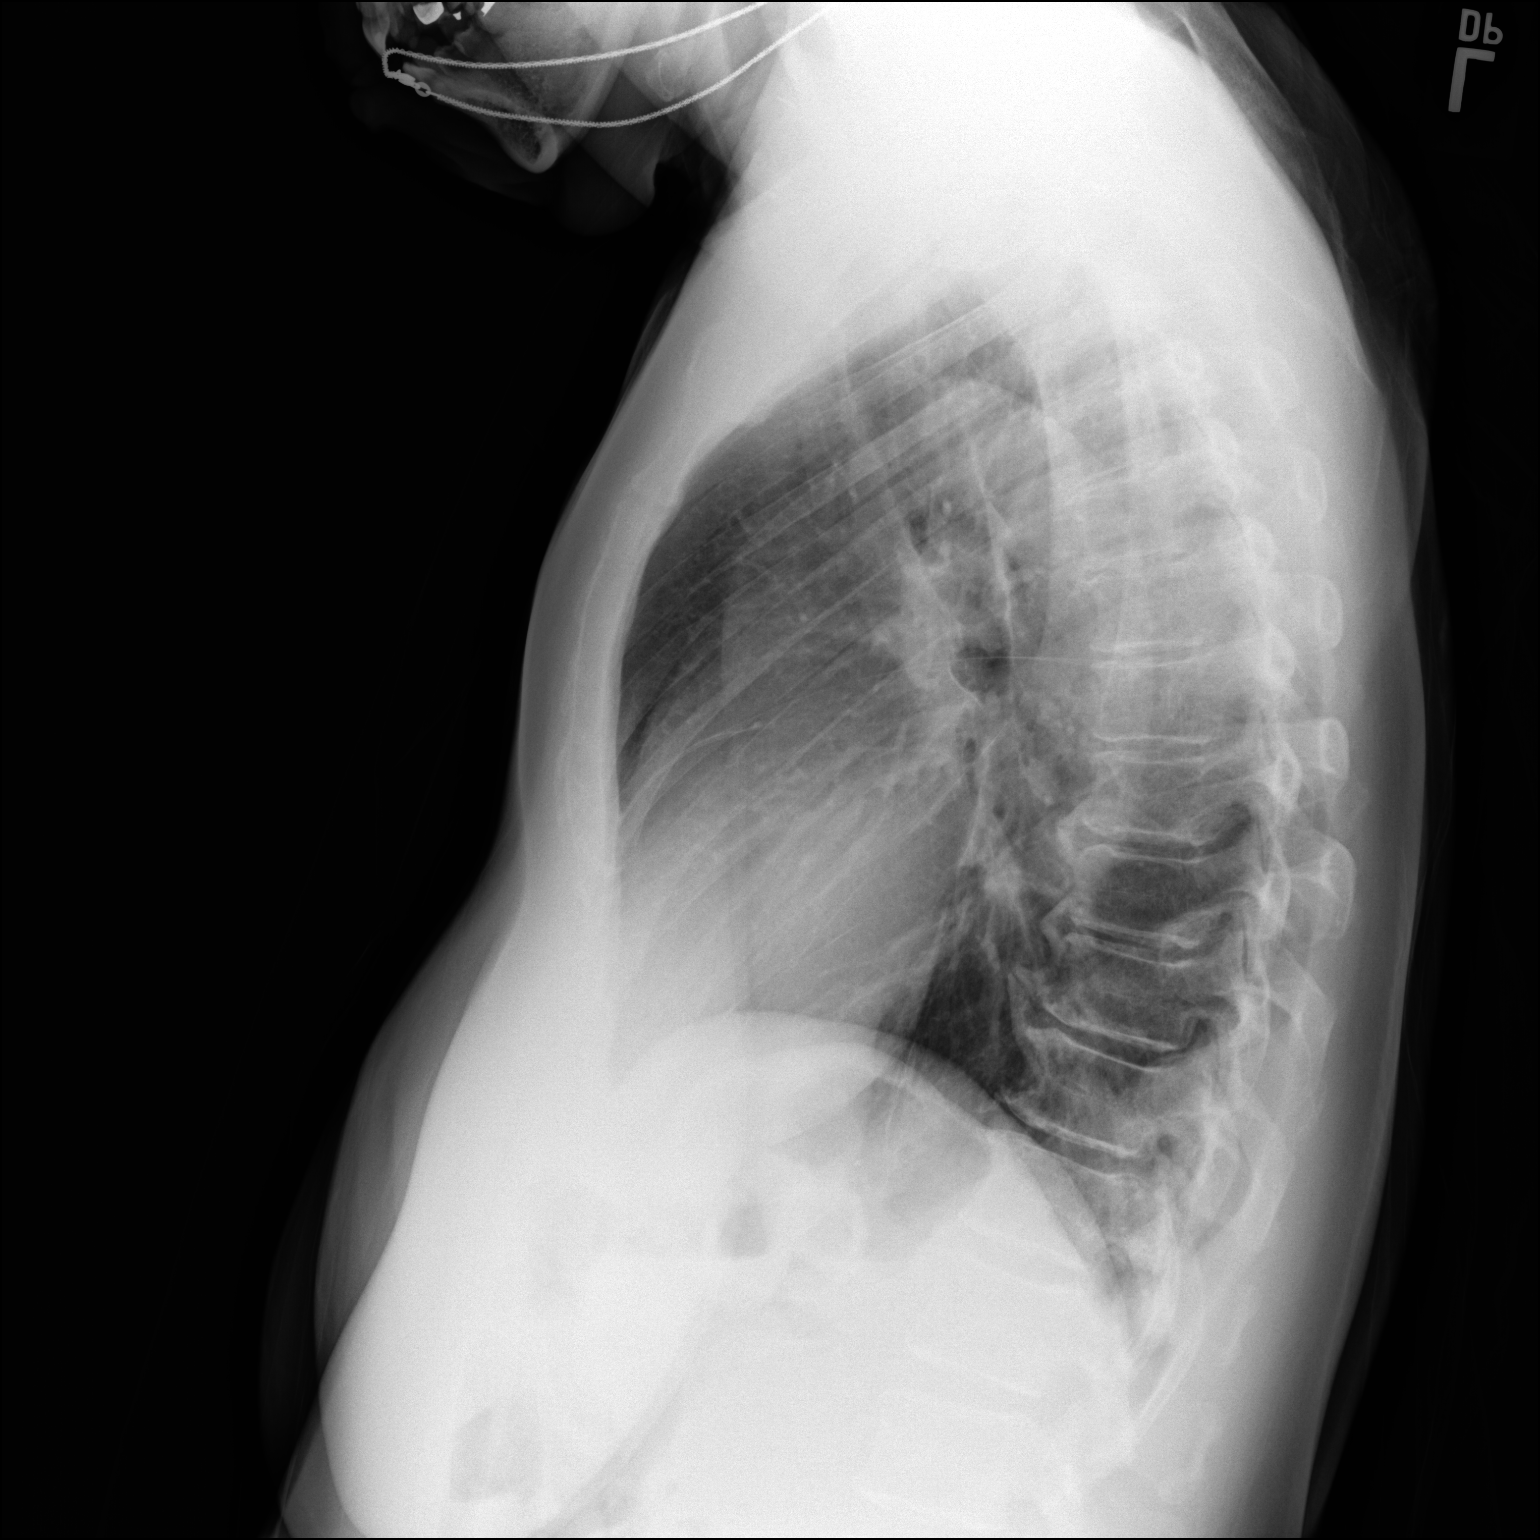

[2 of 2 positions shown; findings below may reference images not displayed]

FINDINGS: The heart size and mediastinal contours are within normal limits.
Both lungs are clear. No pneumothorax or pleural effusion is noted.
The visualized skeletal structures are unremarkable.
IMPRESSION: No active cardiopulmonary disease.

## 2020-02-05 ENCOUNTER — Other Ambulatory Visit: Payer: Self-pay | Admitting: Internal Medicine

## 2020-03-18 ENCOUNTER — Other Ambulatory Visit: Payer: Self-pay | Admitting: Internal Medicine

## 2020-04-18 ENCOUNTER — Encounter: Payer: BC Managed Care – PPO | Admitting: Internal Medicine

## 2020-04-20 ENCOUNTER — Encounter: Payer: BC Managed Care – PPO | Admitting: Internal Medicine

## 2020-05-23 ENCOUNTER — Other Ambulatory Visit: Payer: Self-pay | Admitting: Internal Medicine

## 2020-07-12 LAB — HM MAMMOGRAPHY

## 2020-07-14 ENCOUNTER — Encounter: Payer: Self-pay | Admitting: Internal Medicine

## 2020-08-04 ENCOUNTER — Ambulatory Visit: Payer: Medicare PPO | Admitting: Internal Medicine

## 2020-08-04 ENCOUNTER — Ambulatory Visit (INDEPENDENT_AMBULATORY_CARE_PROVIDER_SITE_OTHER): Payer: Medicare PPO

## 2020-08-04 ENCOUNTER — Encounter: Payer: Self-pay | Admitting: Internal Medicine

## 2020-08-04 ENCOUNTER — Other Ambulatory Visit: Payer: Self-pay

## 2020-08-04 VITALS — BP 114/62 | HR 88 | Temp 98.1°F | Ht 68.6 in | Wt 201.0 lb

## 2020-08-04 DIAGNOSIS — M545 Low back pain, unspecified: Secondary | ICD-10-CM | POA: Diagnosis not present

## 2020-08-04 DIAGNOSIS — E6609 Other obesity due to excess calories: Secondary | ICD-10-CM

## 2020-08-04 DIAGNOSIS — E785 Hyperlipidemia, unspecified: Secondary | ICD-10-CM | POA: Diagnosis not present

## 2020-08-04 DIAGNOSIS — E1169 Type 2 diabetes mellitus with other specified complication: Secondary | ICD-10-CM

## 2020-08-04 DIAGNOSIS — Z6830 Body mass index (BMI) 30.0-30.9, adult: Secondary | ICD-10-CM

## 2020-08-04 DIAGNOSIS — I10 Essential (primary) hypertension: Secondary | ICD-10-CM | POA: Diagnosis not present

## 2020-08-04 DIAGNOSIS — Z0000 Encounter for general adult medical examination without abnormal findings: Secondary | ICD-10-CM | POA: Diagnosis not present

## 2020-08-04 DIAGNOSIS — E890 Postprocedural hypothyroidism: Secondary | ICD-10-CM

## 2020-08-04 DIAGNOSIS — G8929 Other chronic pain: Secondary | ICD-10-CM

## 2020-08-04 DIAGNOSIS — E119 Type 2 diabetes mellitus without complications: Secondary | ICD-10-CM

## 2020-08-04 LAB — POCT URINALYSIS DIPSTICK
Bilirubin, UA: NEGATIVE
Blood, UA: NEGATIVE
Glucose, UA: NEGATIVE
Ketones, UA: NEGATIVE
Leukocytes, UA: NEGATIVE
Nitrite, UA: NEGATIVE
Protein, UA: NEGATIVE
Spec Grav, UA: >=1.03 — AB (ref 1.010–1.025)
Urobilinogen, UA: 0.2 E.U./dL
pH, UA: 5.5 (ref 5.0–8.0)

## 2020-08-04 LAB — POCT UA - MICROALBUMIN
Albumin/Creatinine Ratio, Urine, POC: <30
Creatinine, POC: 200 mg/dL
Microalbumin Ur, POC: 30 mg/L

## 2020-08-04 MED ORDER — METHOCARBAMOL 750 MG PO TABS: 750.0000 mg | ORAL_TABLET | Freq: Three times a day (TID) | ORAL | 1 refills | Status: DC

## 2020-08-04 MED ORDER — OZEMPIC (1 MG/DOSE) 2 MG/1.5ML ~~LOC~~ SOPN: 1.0000 mg | PEN_INJECTOR | SUBCUTANEOUS | 2 refills | Status: DC

## 2020-08-04 MED ORDER — SIMVASTATIN 40 MG PO TABS
40.0000 mg | ORAL_TABLET | Freq: Every day | ORAL | 2 refills | Status: DC
Start: 2020-08-04 — End: 2020-10-10

## 2020-08-04 NOTE — Progress Notes (Signed)
I,Katawbba Wiggins,acting as a Education administrator for Maximino Greenland, MD.,have documented all relevant documentation on the behalf of Maximino Greenland, MD,as directed by  Maximino Greenland, MD while in the presence of Maximino Greenland, MD.  This visit occurred during the SARS-CoV-2 public health emergency.  Safety protocols were in place, including screening questions prior to the visit, additional usage of staff PPE, and extensive cleaning of exam room while observing appropriate contact time as indicated for disinfecting solutions.  Subjective:     Patient ID: Felicia Garrett , female    DOB: 03-21-1956 , 64 y.o.   MRN: [id number]   Chief Complaint  Patient presents with  . Annual Exam  . Diabetes  . Hypertension    HPI  She is here today for a full physical examination. She is followed by Dr. Mancel Bale for her GYN exams. She has no specific concerns or complaints at this time.   Diabetes She presents for her follow-up diabetic visit. She has type 2 diabetes mellitus. Her disease course has been stable. There are no hypoglycemic associated symptoms. Pertinent negatives for diabetes include no blurred vision and no chest pain. There are no hypoglycemic complications. Risk factors for coronary artery disease include diabetes mellitus, hypertension and post-menopausal. She is following a diabetic diet. An ACE inhibitor/angiotensin II receptor blocker is being taken.  Hypertension This is a chronic problem. The current episode started more than 1 year ago. The problem has been gradually improving since onset. The problem is controlled. Pertinent negatives include no blurred vision, chest pain, palpitations or shortness of breath. Risk factors for coronary artery disease include diabetes mellitus and dyslipidemia. Past treatments include angiotensin blockers. The current treatment provides moderate improvement.     Past Medical History:  Diagnosis Date  . Anemia   . Arthritis   . Depression   . Diabetes  mellitus   . Hyperlipidemia   . Thyroid nodule      Family History  Problem Relation Age of Onset  . Hypertension Mother   . Hyperlipidemia Mother   . Hyperthyroidism Mother   . Diabetes Father      Current Outpatient Medications:  .  buPROPion (WELLBUTRIN XL) 150 MG 24 hr tablet, Take 1 tablet (150 mg total) by mouth daily., Disp: 90 tablet, Rfl: 2 .  levothyroxine (SYNTHROID) 137 MCG tablet, TAKE 1 TABLET (137 MCG TOTAL) BY MOUTH DAILY BEFORE BREAKFAST. MONDAY - FRIDAY (Patient taking differently: Take 137 mcg by mouth daily before breakfast. Monday - Sunday), Disp: 80 tablet, Rfl: 2 .  losartan-hydrochlorothiazide (HYZAAR) 50-12.5 MG tablet, TAKE 1 TABLET BY ORAL ROUTE EVERY DAY, Disp: 90 tablet, Rfl: 2 .  nystatin cream (MYCOSTATIN), APPLY TO AFFECTED AREA TWICE A DAY, Disp: , Rfl:  .  oxybutynin (DITROPAN) 5 MG tablet, oxybutynin chloride 5 mg tablet  TAKE 1 TABLET BY MOUTH TWICE A DAY AS NEEDED, Disp: , Rfl:  .  Semaglutide, 1 MG/DOSE, (OZEMPIC, 1 MG/DOSE,) 2 MG/1.5ML SOPN, Inject 1 mg into the skin once a week., Disp: 4.5 mL, Rfl: 2 .  simvastatin (ZOCOR) 40 MG tablet, Take 1 tablet (40 mg total) by mouth daily at 6 PM., Disp: 90 tablet, Rfl: 2 .  XIGDUO XR 01-999 MG TB24, TAKE 1 TABLET BY ORAL ROUTE 2 TIMES EVERY DAY IN THE MORNING WITH FOOD, Disp: 60 tablet, Rfl: 2 .  Chlorphen-PE-Acetaminophen (NOREL AD) 4-10-325 MG TABS, Take 4-10 mg by mouth 2 (two) times daily as needed. (Patient not taking: Reported on 08/04/2020), Disp: 20  tablet, Rfl: 0 .  Iron-FA-B Cmp-C-Biot-Probiotic (FUSION PLUS) CAPS, TAKE 1 CAPSULE BY ORAL ROUTE EVERY DAY BETWEEN MEALS, Disp: 30 capsule, Rfl: 1 .  levothyroxine (SYNTHROID) 150 MCG tablet, TAKE 1 TABLET BY MOUTH SATURDAY - SUNDAY (Patient not taking: Reported on 08/04/2020), Disp: 24 tablet, Rfl: 2 .  methocarbamol (ROBAXIN-750) 750 MG tablet, Take 1 tablet (750 mg total) by mouth 3 (three) times daily., Disp: 40 tablet, Rfl: 1   Allergies  Allergen  Reactions  . Penicillins Shortness Of Breath    CHEST PAIN      The patient states she uses none for birth control. Last LMP was No LMP recorded. Patient is postmenopausal.. Negative for Dysmenorrhea. Negative for: breast discharge, breast lump(s), breast pain and breast self exam. Associated symptoms include abnormal vaginal bleeding. Pertinent negatives include abnormal bleeding (hematology), anxiety, decreased libido, depression, difficulty falling sleep, dyspareunia, history of infertility, nocturia, sexual dysfunction, sleep disturbances, urinary incontinence, urinary urgency, vaginal discharge and vaginal itching. Diet regular.The patient states her exercise level is   moderate.  . The patient's tobacco use is:  Social History   Tobacco Use  Smoking Status Never Smoker  Smokeless Tobacco Never Used  . She has been exposed to passive smoke. The patient's alcohol use is:  Social History   Substance and Sexual Activity  Alcohol Use No   Review of Systems  Constitutional: Negative.   HENT: Negative.   Eyes: Negative.  Negative for blurred vision.  Respiratory: Negative.  Negative for shortness of breath.   Cardiovascular: Negative.  Negative for chest pain and palpitations.  Endocrine: Negative.   Genitourinary: Negative.   Musculoskeletal: Positive for back pain.       C/o intermittent back pain. Denies LE weakness/paresthesias. Sx usually improve with use of Robaxin.   Skin: Negative.   Allergic/Immunologic: Negative.   Neurological: Negative.   Hematological: Negative.   Psychiatric/Behavioral: Negative.      Today's Vitals   08/04/20 1457  BP: 114/62  Pulse: 88  Temp: 98.1 F (36.7 C)  TempSrc: Oral  Weight: 201 lb (91.2 kg)  Height: 5' 8.6" (1.742 m)  PainSc: 2   PainLoc: Back   Body mass index is 30.03 kg/m.  Wt Readings from Last 3 Encounters:  08/04/20 201 lb (91.2 kg)  08/04/20 201 lb (91.2 kg)  08/25/19 207 lb 9.6 oz (94.2 kg)   Objective:  Physical  Exam Vitals and nursing note reviewed.  Constitutional:      Appearance: Normal appearance.  HENT:     Head: Normocephalic and atraumatic.     Right Ear: Tympanic membrane, ear canal and external ear normal.     Left Ear: Tympanic membrane, ear canal and external ear normal.     Nose:     Comments: Deferred, masked    Mouth/Throat:     Comments: Deferred, masked Eyes:     Extraocular Movements: Extraocular movements intact.     Conjunctiva/sclera: Conjunctivae normal.     Pupils: Pupils are equal, round, and reactive to light.  Cardiovascular:     Rate and Rhythm: Normal rate and regular rhythm.     Pulses: Normal pulses.          Dorsalis pedis pulses are 2+ on the right side and 2+ on the left side.     Heart sounds: Normal heart sounds.  Pulmonary:     Effort: Pulmonary effort is normal.     Breath sounds: Normal breath sounds.  Chest:     Breasts: Tanner  Score is 5.        Right: Normal.        Left: Normal.  Abdominal:     General: Abdomen is flat. Bowel sounds are normal.     Palpations: Abdomen is soft.     Comments: Lap band port palpated  Genitourinary:    Comments: deferred Musculoskeletal:        General: Normal range of motion.     Cervical back: Normal range of motion and neck supple.  Feet:     Right foot:     Protective Sensation: 5 sites tested. 5 sites sensed.     Skin integrity: Dry skin present.     Toenail Condition: Right toenails are normal.     Left foot:     Protective Sensation: 5 sites tested. 5 sites sensed.     Skin integrity: Dry skin present.     Toenail Condition: Left toenails are normal.  Skin:    General: Skin is warm and dry.  Neurological:     General: No focal deficit present.     Mental Status: She is alert and oriented to person, place, and time.  Psychiatric:        Mood and Affect: Mood normal.        Behavior: Behavior normal.         Assessment And Plan:     1. Routine general medical examination at health care  facility Comments: A full exam was performed. Importance of monthly self breast exams was discussed with the patient. PATIENT IS ADVISED TO GET 30-45 MINUTES REGULAR EXERCISE NO LESS THAN FOUR TO FIVE DAYS PER WEEK - BOTH WEIGHTBEARING EXERCISES AND AEROBIC ARE RECOMMENDED.  PATIENT IS ADVISED TO FOLLOW A HEALTHY DIET WITH AT LEAST SIX FRUITS/VEGGIES PER DAY, DECREASE INTAKE OF RED MEAT, AND TO INCREASE FISH INTAKE TO TWO DAYS PER WEEK.  MEATS/FISH SHOULD NOT BE FRIED, BAKED OR BROILED IS PREFERABLE.  I SUGGEST WEARING SPF 50 SUNSCREEN ON EXPOSED PARTS AND ESPECIALLY WHEN IN THE DIRECT SUNLIGHT FOR AN EXTENDED PERIOD OF TIME.  PLEASE AVOID FAST FOOD RESTAURANTS AND INCREASE YOUR WATER INTAKE.   2. Type 2 diabetes mellitus with hyperlipidemia ([place]) Comments: Diabetic foot exam was performed. I will request her 2020 eye exam.  She will continue with statin therapy. I DISCUSSED WITH THE PATIENT AT LENGTH REGARDING THE GOALS OF GLYCEMIC CONTROL AND POSSIBLE LONG-TERM COMPLICATIONS.  I  ALSO STRESSED THE IMPORTANCE OF COMPLIANCE WITH HOME GLUCOSE MONITORING, DIETARY RESTRICTIONS INCLUDING AVOIDANCE OF SUGARY DRINKS/PROCESSED FOODS,  ALONG WITH REGULAR EXERCISE.  I  ALSO STRESSED THE IMPORTANCE OF ANNUAL EYE EXAMS, SELF FOOT CARE AND COMPLIANCE WITH OFFICE VISITS.  - POCT Urinalysis Dipstick (81002) - POCT UA - Microalbumin - CMP14+EGFR - CBC - Lipid panel - Hemoglobin A1c  3. Essential hypertension, benign Comments: Chronic, well controlled. EKG performed, NSR w/o acute changes. She is encouraged to avoid adding salt to her foods.  - EKG 12-Lead  4. Postsurgical hypothyroidism Comments: I will check thyroid panel and adjust meds as needed.  - TSH - T4, free  5. Chronic bilateral low back pain without sciatica Comments: Chronic, encouraged to perform stretching exercises daily.  she was given rx Robaxin tid prn.   6. Class 1 obesity due to excess calories with serious comorbidity and body mass  index (BMI) of 30.0 to 30.9 in adult Comments: Her BMI is acceptable for her demographic. Encouraged to aim for at least 150 minutes per week.  Patient was given opportunity to ask questions. Patient verbalized understanding of the plan and was able to repeat key elements of the plan. All questions were answered to their satisfaction.   Maximino Greenland, MD   I, Maximino Greenland, MD, have reviewed all documentation for this visit. The documentation on 08/21/20 for the exam, diagnosis, procedures, and orders are all accurate and complete.  THE PATIENT IS ENCOURAGED TO PRACTICE SOCIAL DISTANCING DUE TO THE COVID-19 PANDEMIC.

## 2020-08-04 NOTE — Progress Notes (Signed)
This visit occurred during the SARS-CoV-2 public health emergency.  Safety protocols were in place, including screening questions prior to the visit, additional usage of staff PPE, and extensive cleaning of exam room while observing appropriate contact time as indicated for disinfecting solutions.  Subjective:   Felicia SermonCassandra Garrett is a 64 y.o. female who presents for an Initial Medicare Annual Wellness Visit.  Review of Systems     Cardiac Risk Factors include: advanced age (>7055men, 37>65 women);diabetes mellitus;hypertension;obesity (BMI >30kg/m2)     Objective:    Today's Vitals   08/04/20 1524  BP: 114/62  Pulse: 88  Temp: 98.1 F (36.7 C)  TempSrc: Oral  Weight: 201 lb (91.2 kg)  Height: 5' 8.6" (1.742 m)   Body mass index is 30.03 kg/m.  Advanced Directives 08/04/2020 10/08/2014 10/08/2014 10/05/2014  Does Patient Have a Medical Advance Directive? No Yes - No  Type of Advance Directive - Living will - -  Does patient want to make changes to medical advance directive? - No - Patient declined - -  Copy of Healthcare Power of Attorney in Chart? - No - copy requested - -  Would patient like information on creating a medical advance directive? Yes (MAU/Ambulatory/Procedural Areas - Information given) - (No Data) Yes - Educational materials given    Current Medications (verified) Outpatient Encounter Medications as of 08/04/2020  Medication Sig  . buPROPion (WELLBUTRIN XL) 150 MG 24 hr tablet Take 1 tablet (150 mg total) by mouth daily.  . Chlorphen-PE-Acetaminophen (NOREL AD) 4-10-325 MG TABS Take 4-10 mg by mouth 2 (two) times daily as needed. (Patient not taking: Reported on 08/04/2020)  . Iron-FA-B Cmp-C-Biot-Probiotic (FUSION PLUS) CAPS TAKE 1 CAPSULE BY ORAL ROUTE EVERY DAY BETWEEN MEALS  . levothyroxine (SYNTHROID) 137 MCG tablet TAKE 1 TABLET (137 MCG TOTAL) BY MOUTH DAILY BEFORE BREAKFAST. MONDAY - FRIDAY (Patient taking differently: Take 137 mcg by mouth daily before  breakfast. Monday - Sunday)  . levothyroxine (SYNTHROID) 150 MCG tablet TAKE 1 TABLET BY MOUTH SATURDAY - SUNDAY (Patient not taking: Reported on 08/04/2020)  . losartan-hydrochlorothiazide (HYZAAR) 50-12.5 MG tablet TAKE 1 TABLET BY ORAL ROUTE EVERY DAY  . nystatin cream (MYCOSTATIN) APPLY TO AFFECTED AREA TWICE A DAY  . oxybutynin (DITROPAN) 5 MG tablet oxybutynin chloride 5 mg tablet  TAKE 1 TABLET BY MOUTH TWICE A DAY AS NEEDED  . XIGDUO XR 01-999 MG TB24 TAKE 1 TABLET BY ORAL ROUTE 2 TIMES EVERY DAY IN THE MORNING WITH FOOD  . [DISCONTINUED] OZEMPIC, 1 MG/DOSE, 2 MG/1.5ML SOPN INJECT 1 MG INTO THE SKIN ONCE A WEEK.  . [DISCONTINUED] simvastatin (ZOCOR) 40 MG tablet TAKE 1 TABLET BY MOUTH EVERYDAY AT BEDTIME   No facility-administered encounter medications on file as of 08/04/2020.    Allergies (verified) Penicillins   History: Past Medical History:  Diagnosis Date  . Anemia   . Arthritis   . Depression   . Diabetes mellitus   . Hyperlipidemia   . Thyroid nodule    Past Surgical History:  Procedure Laterality Date  . LAPAROSCOPIC GASTRIC BANDING  06/14/08  . THYROIDECTOMY N/A 10/08/2014   Procedure: NEAR TOTAL THYROIDECTOMY;  Surgeon: Valarie MerinoMatthew B Martin, MD;  Location: [facility];  Service: General;  Laterality: N/A;  . UTERINE FIBROID EMBOLIZATION  10 yrs ago   Family History  Problem Relation Age of Onset  . Hypertension Mother   . Hyperlipidemia Mother   . Hyperthyroidism Mother   . Diabetes Father    Social History   Socioeconomic  History  . Marital status: Single    Spouse name: Not on file  . Number of children: Not on file  . Years of education: Not on file  . Highest education level: Not on file  Occupational History  . Not on file  Tobacco Use  . Smoking status: Never Smoker  . Smokeless tobacco: Never Used  Vaping Use  . Vaping Use: Never used  Substance and Sexual Activity  . Alcohol use: No  . Drug use: No  . Sexual activity: Never    Comment: meno     Other Topics Concern  . Not on file  Social History Narrative  . Not on file   Social Determinants of Health   Financial Resource Strain: Low Risk   . Difficulty of Paying Living Expenses: Not hard at all  Food Insecurity: No Food Insecurity  . Worried About Programme researcher, broadcasting/film/video in the Last Year: Never true  . Ran Out of Food in the Last Year: Never true  Transportation Needs: No Transportation Needs  . Lack of Transportation (Medical): No  . Lack of Transportation (Non-Medical): No  Physical Activity: Sufficiently Active  . Days of Exercise per Week: 4 days  . Minutes of Exercise per Session: 40 min  Stress: No Stress Concern Present  . Feeling of Stress : Not at all  Social Connections:   . Frequency of Communication with Friends and Family: Not on file  . Frequency of Social Gatherings with Friends and Family: Not on file  . Attends Religious Services: Not on file  . Active Member of Clubs or Organizations: Not on file  . Attends Banker Meetings: Not on file  . Marital Status: Not on file    Tobacco Counseling Counseling given: Not Answered   Clinical Intake:  Pre-visit preparation completed: Yes  Pain : No/denies pain     Nutritional Status: BMI > 30  Obese Nutritional Risks: None Diabetes: Yes  How often do you need to have someone help you when you read instructions, pamphlets, or other written materials from your doctor or pharmacy?: 1 - Never What is the last grade level you completed in school?: mastre's degree  Diabetic? Yes Nutrition Risk Assessment:  Has the patient had any N/V/D within the last 2 months?  No  Does the patient have any non-healing wounds?  No  Has the patient had any unintentional weight loss or weight gain?  No   Diabetes:  Is the patient diabetic?  Yes  If diabetic, was a CBG obtained today?  No  Did the patient bring in their glucometer from home?  No  How often do you monitor your CBG's? 1 every few weeks.    Financial Strains and Diabetes Management:  Are you having any financial strains with the device, your supplies or your medication? No .  Does the patient want to be seen by Chronic Care Management for management of their diabetes?  No  Would the patient like to be referred to a Nutritionist or for Diabetic Management?  No   Diabetic Exams:  Diabetic Eye Exam: Completed 07/31/2019 Diabetic Foot Exam: Completed today   Interpreter Needed?: No  Information entered by :: NAllen LPN   Activities of Daily Living In your present state of health, do you have any difficulty performing the following activities: 08/04/2020  Hearing? N  Vision? N  Difficulty concentrating or making decisions? N  Walking or climbing stairs? N  Dressing or bathing? N  Doing errands,  shopping? N  Preparing Food and eating ? N  Using the Toilet? N  In the past six months, have you accidently leaked urine? N  Do you have problems with loss of bowel control? N  Managing your Medications? N  Managing your Finances? N  Housekeeping or managing your Housekeeping? N  Some recent data might be hidden    Patient Care Team: [name], MD as PCP - General (Internal Medicine) Liberty Handy, NP as Referring Physician (Nurse Practitioner)  Indicate any recent Medical Services you may have received from other than [facility] providers in the past year (date may be approximate).     Assessment:   This is a routine wellness examination for Felicia Garrett.  Hearing/Vision screen  Hearing Screening   125Hz  250Hz  500Hz  1000Hz  2000Hz  3000Hz  4000Hz  6000Hz  8000Hz   Right ear:           Left ear:           Vision Screening Comments: Regular eye exams, Dr.   Dietary issues and exercise activities discussed: Current Exercise Habits: Home exercise routine, Type of exercise: walking, Time (Minutes): 40, Frequency (Times/Week): 4, Weekly Exercise (Minutes/Week): 160  Goals    . Patient Stated     08/04/2020, drink  more water, start hiking again      Depression Screen PHQ 2/9 Scores 08/04/2020 08/04/2020 05/28/2019 10/29/2018 07/02/2018  PHQ - 2 Score 0 0 0 0 0    Fall Risk Fall Risk  08/04/2020 05/28/2019 04/15/2019 07/02/2018  Falls in the past year? 0 0 0 No  Risk for fall due to : Medication side effect - - -  Follow up Falls evaluation completed;Education provided;Falls prevention discussed - - -    Any stairs in or around the home? Yes  If so, are there any without handrails? No  Home free of loose throw rugs in walkways, pet beds, electrical cords, etc? Yes  Adequate lighting in your home to reduce risk of falls? Yes   ASSISTIVE DEVICES UTILIZED TO PREVENT FALLS:  Life alert? No  Use of a cane, walker or w/c? No  Grab bars in the bathroom? No  Shower chair or bench in shower? No  Elevated toilet seat or a handicapped toilet? Yes   TIMED UP AND GO:  Was the test performed? No .    Gait steady and fast without use of assistive device  Cognitive Function:     6CIT Screen 08/04/2020  What Year? 0 points  What month? 0 points  What time? 0 points  Count back from 20 0 points  Months in reverse 0 points  Repeat phrase 0 points  Total Score 0    Immunizations Immunization History  Administered Date(s) Administered  . Influenza Inj Mdck Quad Pf 06/06/2018  . Influenza, High Dose Seasonal PF 07/04/2019, 06/16/2020  . Influenza,inj,Quad PF,6+ Mos 06/30/2017, 07/03/2020  . Influenza-Unspecified 06/16/2018, 07/04/2019  . PFIZER SARS-COV-2 Vaccination 10/13/2019, 11/03/2019, 06/20/2020  . PPD Test 10/22/2019  . Tdap 10/09/2017  . Zoster 12/10/2012, 06/27/2018  . Zoster Recombinat (Shingrix) 06/27/2018, 10/29/2018, 07/04/2019    TDAP status: Up to date Flu Vaccine status: Up to date Pneumococcal vaccine status: patient to verify Covid-19 vaccine status: Completed vaccines  Qualifies for Shingles Vaccine? Yes   Zostavax completed Yes   Shingrix Completed?: Yes  Screening  Tests Health Maintenance  Topic Date Due  . PNA vac Low Risk Adult (1 of 2 - PCV13) Never done  . OPHTHALMOLOGY EXAM  07/26/2019  . HEMOGLOBIN  A1C  02/01/2021  . MAMMOGRAM  07/12/2021  . FOOT EXAM  08/04/2021  . COLONOSCOPY  02/21/2027  . TETANUS/TDAP  10/10/2027  . INFLUENZA VACCINE  Completed  . DEXA SCAN  Completed  . COVID-19 Vaccine  Completed  . Hepatitis C Screening  Completed    Health Maintenance  Health Maintenance Due  Topic Date Due  . PNA vac Low Risk Adult (1 of 2 - PCV13) Never done  . OPHTHALMOLOGY EXAM  07/26/2019    Colorectal cancer screening: Completed 02/20/2017. Repeat every 10 years Mammogram status: Completed 07/12/2020. Repeat every year Bone Density status: Completed 09/25/2018.  Lung Cancer Screening: (Low Dose CT Chest recommended if Age 66-80 years, 30 pack-year currently smoking OR have quit w/in 15years.) does not qualify.   Lung Cancer Screening Referral: no  Additional Screening:  Hepatitis C Screening: does qualify; Completed 12/08/2012  Vision Screening: Recommended annual ophthalmology exams for early detection of glaucoma and other disorders of the eye. Is the patient up to date with their annual eye exam?  Yes  Who is the provider or what is the name of the office in which the patient attends annual eye exams? Dr. Dione Booze If pt is not established with a provider, would they like to be referred to a provider to establish care? No .   Dental Screening: Recommended annual dental exams for proper oral hygiene  Community Resource Referral / Chronic Care Management: CRR required this visit?  No   CCM required this visit?  No      Plan:     I have personally reviewed and noted the following in the patient's chart:   . Medical and social history . Use of alcohol, tobacco or illicit drugs  . Current medications and supplements . Functional ability and status . Nutritional status . Physical activity . Advanced directives . List of  other physicians . Hospitalizations, surgeries, and ER visits in previous 12 months . Vitals . Screenings to include cognitive, depression, and falls . Referrals and appointments  In addition, I have reviewed and discussed with patient certain preventive protocols, quality metrics, and best practice recommendations. A written personalized care plan for preventive services as well as general preventive health recommendations were provided to patient.     Barb Merino, LPN   [id number]   Nurse Notes:

## 2020-08-04 NOTE — Patient Instructions (Signed)
Felicia Garrett , Thank you for taking time to come for your Medicare Wellness Visit. I appreciate your ongoing commitment to your health goals. Please review the following plan we discussed and let me know if I can assist you in the future.   Screening recommendations/referrals: Colonoscopy: completed 02/20/2017 Mammogram: completed 07/12/2020, due 07/12/2021 Bone Density: completed 09/25/2018 Recommended yearly ophthalmology/optometry visit for glaucoma screening and checkup Recommended yearly dental visit for hygiene and checkup  Vaccinations: Influenza vaccine: completed 07/03/2020, due 04/24/2020 Pneumococcal vaccine: patient to verify Tdap vaccine: completed 10/09/2017, due 10/10/2027 Shingles vaccine: completed   Covid-19: 10/13/2019, 11/03/2019, 06/20/2020  Advanced directives: Advance directive discussed with you today. Even though you declined this today please call our office should you change your mind and we can give you the proper paperwork for you to fill out.   Conditions/risks identified: none  Next appointment: Follow up in one year for your annual wellness visit    Preventive Care 65 Years and Older, Female Preventive care refers to lifestyle choices and visits with your health care provider that can promote health and wellness. What does preventive care include?  A yearly physical exam. This is also called an annual well check.  Dental exams once or twice a year.  Routine eye exams. Ask your health care provider how often you should have your eyes checked.  Personal lifestyle choices, including:  Daily care of your teeth and gums.  Regular physical activity.  Eating a healthy diet.  Avoiding tobacco and drug use.  Limiting alcohol use.  Practicing safe sex.  Taking low-dose aspirin every day.  Taking vitamin and mineral supplements as recommended by your health care provider. What happens during an annual well check? The services and screenings done by your  health care provider during your annual well check will depend on your age, overall health, lifestyle risk factors, and family history of disease. Counseling  Your health care provider may ask you questions about your:  Alcohol use.  Tobacco use.  Drug use.  Emotional well-being.  Home and relationship well-being.  Sexual activity.  Eating habits.  History of falls.  Memory and ability to understand (cognition).  Work and work Astronomer.  Reproductive health. Screening  You may have the following tests or measurements:  Height, weight, and BMI.  Blood pressure.  Lipid and cholesterol levels. These may be checked every 5 years, or more frequently if you are over 30 years old.  Skin check.  Lung cancer screening. You may have this screening every year starting at age 74 if you have a 30-pack-year history of smoking and currently smoke or have quit within the past 15 years.  Fecal occult blood test (FOBT) of the stool. You may have this test every year starting at age 63.  Flexible sigmoidoscopy or colonoscopy. You may have a sigmoidoscopy every 5 years or a colonoscopy every 10 years starting at age 16.  Hepatitis C blood test.  Hepatitis B blood test.  Sexually transmitted disease (STD) testing.  Diabetes screening. This is done by checking your blood sugar (glucose) after you have not eaten for a while (fasting). You may have this done every 1-3 years.  Bone density scan. This is done to screen for osteoporosis. You may have this done starting at age 20.  Mammogram. This may be done every 1-2 years. Talk to your health care provider about how often you should have regular mammograms. Talk with your health care provider about your test results, treatment options, and if  necessary, the need for more tests. Vaccines  Your health care provider may recommend certain vaccines, such as:  Influenza vaccine. This is recommended every year.  Tetanus, diphtheria, and  acellular pertussis (Tdap, Td) vaccine. You may need a Td booster every 10 years.  Zoster vaccine. You may need this after age 3.  Pneumococcal 13-valent conjugate (PCV13) vaccine. One dose is recommended after age 41.  Pneumococcal polysaccharide (PPSV23) vaccine. One dose is recommended after age 63. Talk to your health care provider about which screenings and vaccines you need and how often you need them. This information is not intended to replace advice given to you by your health care provider. Make sure you discuss any questions you have with your health care provider. Document Released: 10/07/2015 Document Revised: 05/30/2016 Document Reviewed: 07/12/2015 Elsevier Interactive Patient Education  2017 [place] Prevention in the Home Falls can cause injuries. They can happen to people of all ages. There are many things you can do to make your home safe and to help prevent falls. What can I do on the outside of my home?  Regularly fix the edges of walkways and driveways and fix any cracks.  Remove anything that might make you trip as you walk through a door, such as a raised step or threshold.  Trim any bushes or trees on the path to your home.  Use bright outdoor lighting.  Clear any walking paths of anything that might make someone trip, such as rocks or tools.  Regularly check to see if handrails are loose or broken. Make sure that both sides of any steps have handrails.  Any raised decks and porches should have guardrails on the edges.  Have any leaves, snow, or ice cleared regularly.  Use sand or salt on walking paths during winter.  Clean up any spills in your garage right away. This includes oil or grease spills. What can I do in the bathroom?  Use night lights.  Install grab bars by the toilet and in the tub and shower. Do not use towel bars as grab bars.  Use non-skid mats or decals in the tub or shower.  If you need to sit down in the shower, use  a plastic, non-slip stool.  Keep the floor dry. Clean up any water that spills on the floor as soon as it happens.  Remove soap buildup in the tub or shower regularly.  Attach bath mats securely with double-sided non-slip rug tape.  Do not have throw rugs and other things on the floor that can make you trip. What can I do in the bedroom?  Use night lights.  Make sure that you have a light by your bed that is easy to reach.  Do not use any sheets or blankets that are too big for your bed. They should not hang down onto the floor.  Have a firm chair that has side arms. You can use this for support while you get dressed.  Do not have throw rugs and other things on the floor that can make you trip. What can I do in the kitchen?  Clean up any spills right away.  Avoid walking on wet floors.  Keep items that you use a lot in easy-to-reach places.  If you need to reach something above you, use a strong step stool that has a grab bar.  Keep electrical cords out of the way.  Do not use floor polish or wax that makes floors slippery. If you must  use wax, use non-skid floor wax.  Do not have throw rugs and other things on the floor that can make you trip. What can I do with my stairs?  Do not leave any items on the stairs.  Make sure that there are handrails on both sides of the stairs and use them. Fix handrails that are broken or loose. Make sure that handrails are as long as the stairways.  Check any carpeting to make sure that it is firmly attached to the stairs. Fix any carpet that is loose or worn.  Avoid having throw rugs at the top or bottom of the stairs. If you do have throw rugs, attach them to the floor with carpet tape.  Make sure that you have a light switch at the top of the stairs and the bottom of the stairs. If you do not have them, ask someone to add them for you. What else can I do to help prevent falls?  Wear shoes that:  Do not have high heels.  Have  rubber bottoms.  Are comfortable and fit you well.  Are closed at the toe. Do not wear sandals.  If you use a stepladder:  Make sure that it is fully opened. Do not climb a closed stepladder.  Make sure that both sides of the stepladder are locked into place.  Ask someone to hold it for you, if possible.  Clearly mark and make sure that you can see:  Any grab bars or handrails.  First and last steps.  Where the edge of each step is.  Use tools that help you move around (mobility aids) if they are needed. These include:  Canes.  Walkers.  Scooters.  Crutches.  Turn on the lights when you go into a dark area. Replace any light bulbs as soon as they burn out.  Set up your furniture so you have a clear path. Avoid moving your furniture around.  If any of your floors are uneven, fix them.  If there are any pets around you, be aware of where they are.  Review your medicines with your doctor. Some medicines can make you feel dizzy. This can increase your chance of falling. Ask your doctor what other things that you can do to help prevent falls. This information is not intended to replace advice given to you by your health care provider. Make sure you discuss any questions you have with your health care provider. Document Released: 07/07/2009 Document Revised: 02/16/2016 Document Reviewed: 10/15/2014 Elsevier Interactive Patient Education  2017 [employer].

## 2020-08-04 NOTE — Patient Instructions (Signed)
Health Maintenance, Female Adopting a healthy lifestyle and getting preventive care are important in promoting health and wellness. Ask your health care provider about:  The right schedule for you to have regular tests and exams.  Things you can do on your own to prevent diseases and keep yourself healthy. What should I know about diet, weight, and exercise? Eat a healthy diet   Eat a diet that includes plenty of vegetables, fruits, low-fat dairy products, and lean protein.  Do not eat a lot of foods that are high in solid fats, added sugars, or sodium. Maintain a healthy weight Body mass index (BMI) is used to identify weight problems. It estimates body fat based on height and weight. Your health care provider can help determine your BMI and help you achieve or maintain a healthy weight. Get regular exercise Get regular exercise. This is one of the most important things you can do for your health. Most adults should:  Exercise for at least 150 minutes each week. The exercise should increase your heart rate and make you sweat (moderate-intensity exercise).  Do strengthening exercises at least twice a week. This is in addition to the moderate-intensity exercise.  Spend less time sitting. Even light physical activity can be beneficial. Watch cholesterol and blood lipids Have your blood tested for lipids and cholesterol at 66 years of age, then have this test every 5 years. Have your cholesterol levels checked more often if:  Your lipid or cholesterol levels are high.  You are older than 66 years of age.  You are at high risk for heart disease. What should I know about cancer screening? Depending on your health history and family history, you may need to have cancer screening at various ages. This may include screening for:  Breast cancer.  Cervical cancer.  Colorectal cancer.  Skin cancer.  Lung cancer. What should I know about heart disease, diabetes, and high blood  pressure? Blood pressure and heart disease  High blood pressure causes heart disease and increases the risk of stroke. This is more likely to develop in people who have high blood pressure readings, are of African descent, or are overweight.  Have your blood pressure checked: ? Every 3-5 years if you are 18-39 years of age. ? Every year if you are 40 years old or older. Diabetes Have regular diabetes screenings. This checks your fasting blood sugar level. Have the screening done:  Once every three years after age 40 if you are at a normal weight and have a low risk for diabetes.  More often and at a younger age if you are overweight or have a high risk for diabetes. What should I know about preventing infection? Hepatitis B If you have a higher risk for hepatitis B, you should be screened for this virus. Talk with your health care provider to find out if you are at risk for hepatitis B infection. Hepatitis C Testing is recommended for:  Everyone born from 1945 through 1965.  Anyone with known risk factors for hepatitis C. Sexually transmitted infections (STIs)  Get screened for STIs, including gonorrhea and chlamydia, if: ? You are sexually active and are younger than 66 years of age. ? You are older than 66 years of age and your health care provider tells you that you are at risk for this type of infection. ? Your sexual activity has changed since you were last screened, and you are at increased risk for chlamydia or gonorrhea. Ask your health care provider if   you are at risk.  Ask your health care provider about whether you are at high risk for HIV. Your health care provider may recommend a prescription medicine to help prevent HIV infection. If you choose to take medicine to prevent HIV, you should first get tested for HIV. You should then be tested every 3 months for as long as you are taking the medicine. Pregnancy  If you are about to stop having your period (premenopausal) and  you may become pregnant, seek counseling before you get pregnant.  Take 400 to 800 micrograms (mcg) of folic acid every day if you become pregnant.  Ask for birth control (contraception) if you want to prevent pregnancy. Osteoporosis and menopause Osteoporosis is a disease in which the bones lose minerals and strength with aging. This can result in bone fractures. If you are 65 years old or older, or if you are at risk for osteoporosis and fractures, ask your health care provider if you should:  Be screened for bone loss.  Take a calcium or vitamin D supplement to lower your risk of fractures.  Be given hormone replacement therapy (HRT) to treat symptoms of menopause. Follow these instructions at home: Lifestyle  Do not use any products that contain nicotine or tobacco, such as cigarettes, e-cigarettes, and chewing tobacco. If you need help quitting, ask your health care provider.  Do not use street drugs.  Do not share needles.  Ask your health care provider for help if you need support or information about quitting drugs. Alcohol use  Do not drink alcohol if: ? Your health care provider tells you not to drink. ? You are pregnant, may be pregnant, or are planning to become pregnant.  If you drink alcohol: ? Limit how much you use to 0-1 drink a day. ? Limit intake if you are breastfeeding.  Be aware of how much alcohol is in your drink. In the [place] one drink equals one 12 oz bottle of beer (355 mL), one 5 oz glass of wine (148 mL), or one 1 oz glass of hard liquor (44 mL). General instructions  Schedule regular health, dental, and eye exams.  Stay current with your vaccines.  Tell your health care provider if: ? You often feel depressed. ? You have ever been abused or do not feel safe at home. Summary  Adopting a healthy lifestyle and getting preventive care are important in promoting health and wellness.  Follow your health care provider's instructions about healthy  diet, exercising, and getting tested or screened for diseases.  Follow your health care provider's instructions on monitoring your cholesterol and blood pressure. This information is not intended to replace advice given to you by your health care provider. Make sure you discuss any questions you have with your health care provider. Document Revised: 09/03/2018 Document Reviewed: 09/03/2018 Elsevier Patient Education  2020 Elsevier Inc[employer]  

## 2020-08-05 LAB — CMP14+EGFR
ALT: 11 IU/L (ref 0–32)
AST: 13 IU/L (ref 0–40)
Albumin/Globulin Ratio: 1.6 (ref 1.2–2.2)
Albumin: 4.2 g/dL (ref 3.8–4.8)
Alkaline Phosphatase: 50 IU/L (ref 44–121)
BUN/Creatinine Ratio: 16 (ref 12–28)
BUN: 13 mg/dL (ref 8–27)
Bilirubin Total: 0.4 mg/dL (ref 0.0–1.2)
CO2: 25 mmol/L (ref 20–29)
Calcium: 9.4 mg/dL (ref 8.7–10.3)
Chloride: 100 mmol/L (ref 96–106)
Creatinine, Ser: 0.83 mg/dL (ref 0.57–1.00)
GFR calc Af Amer: 85 mL/min/{1.73_m2} (ref >59)
GFR calc non Af Amer: 74 mL/min/{1.73_m2} (ref >59)
Globulin, Total: 2.7 g/dL (ref 1.5–4.5)
Glucose: 71 mg/dL (ref 65–99)
Potassium: 3.7 mmol/L (ref 3.5–5.2)
Sodium: 139 mmol/L (ref 134–144)
Total Protein: 6.9 g/dL (ref 6.0–8.5)

## 2020-08-05 LAB — LIPID PANEL
Chol/HDL Ratio: 2.2 ratio (ref 0.0–4.4)
Cholesterol, Total: 181 mg/dL (ref 100–199)
HDL: 82 mg/dL (ref >39)
LDL Chol Calc (NIH): 90 mg/dL (ref 0–99)
Triglycerides: 47 mg/dL (ref 0–149)
VLDL Cholesterol Cal: 9 mg/dL (ref 5–40)

## 2020-08-05 LAB — CBC
Hematocrit: 40.3 % (ref 34.0–46.6)
Hemoglobin: 11.7 g/dL (ref 11.1–15.9)
MCH: 20.5 pg — ABNORMAL LOW (ref 26.6–33.0)
MCHC: 29 g/dL — ABNORMAL LOW (ref 31.5–35.7)
MCV: 71 fL — ABNORMAL LOW (ref 79–97)
Platelets: 290 10*3/uL (ref 150–450)
RBC: 5.7 x10E6/uL — ABNORMAL HIGH (ref 3.77–5.28)
RDW: 16.1 % — ABNORMAL HIGH (ref 11.7–15.4)
WBC: 3.1 10*3/uL — ABNORMAL LOW (ref 3.4–10.8)

## 2020-08-05 LAB — HEMOGLOBIN A1C
Est. average glucose Bld gHb Est-mCnc: 134 mg/dL
Hgb A1c MFr Bld: 6.3 % — ABNORMAL HIGH (ref 4.8–5.6)

## 2020-08-05 LAB — T4, FREE: Free T4: 1.7 ng/dL (ref 0.82–1.77)

## 2020-08-05 LAB — TSH: TSH: 0.962 u[IU]/mL (ref 0.450–4.500)

## 2020-08-07 ENCOUNTER — Other Ambulatory Visit: Payer: Self-pay | Admitting: Internal Medicine

## 2020-08-08 ENCOUNTER — Encounter: Payer: Self-pay | Admitting: Internal Medicine

## 2020-08-21 ENCOUNTER — Encounter: Payer: Self-pay | Admitting: Internal Medicine

## 2020-09-03 ENCOUNTER — Other Ambulatory Visit: Payer: Self-pay | Admitting: Internal Medicine

## 2020-10-04 DIAGNOSIS — H16223 Keratoconjunctivitis sicca, not specified as Sjogren's, bilateral: Secondary | ICD-10-CM | POA: Diagnosis not present

## 2020-10-04 DIAGNOSIS — E119 Type 2 diabetes mellitus without complications: Secondary | ICD-10-CM | POA: Diagnosis not present

## 2020-10-04 DIAGNOSIS — H2513 Age-related nuclear cataract, bilateral: Secondary | ICD-10-CM | POA: Diagnosis not present

## 2020-10-08 ENCOUNTER — Other Ambulatory Visit: Payer: Self-pay | Admitting: Internal Medicine

## 2020-10-08 ENCOUNTER — Encounter: Payer: Self-pay | Admitting: Internal Medicine

## 2020-10-10 ENCOUNTER — Other Ambulatory Visit: Payer: Self-pay | Admitting: Internal Medicine

## 2020-10-10 ENCOUNTER — Other Ambulatory Visit: Payer: Self-pay

## 2020-10-10 MED ORDER — SIMVASTATIN 80 MG PO TABS: 80.0000 mg | ORAL_TABLET | Freq: Every evening | ORAL | 2 refills | Status: DC

## 2020-10-20 DIAGNOSIS — H524 Presbyopia: Secondary | ICD-10-CM | POA: Diagnosis not present

## 2020-10-20 DIAGNOSIS — H5203 Hypermetropia, bilateral: Secondary | ICD-10-CM | POA: Diagnosis not present

## 2020-10-20 DIAGNOSIS — H16223 Keratoconjunctivitis sicca, not specified as Sjogren's, bilateral: Secondary | ICD-10-CM | POA: Diagnosis not present

## 2020-11-01 DIAGNOSIS — Z124 Encounter for screening for malignant neoplasm of cervix: Secondary | ICD-10-CM | POA: Diagnosis not present

## 2020-11-01 DIAGNOSIS — Z01419 Encounter for gynecological examination (general) (routine) without abnormal findings: Secondary | ICD-10-CM | POA: Diagnosis not present

## 2020-11-27 ENCOUNTER — Other Ambulatory Visit: Payer: Self-pay | Admitting: Internal Medicine

## 2020-11-29 ENCOUNTER — Other Ambulatory Visit: Payer: Self-pay

## 2020-11-29 MED ORDER — OZEMPIC (1 MG/DOSE) 4 MG/3ML ~~LOC~~ SOPN: 1.0000 mg | PEN_INJECTOR | SUBCUTANEOUS | 1 refills | Status: DC

## 2020-12-06 ENCOUNTER — Ambulatory Visit: Payer: Medicare PPO | Admitting: Internal Medicine

## 2020-12-14 ENCOUNTER — Other Ambulatory Visit: Payer: Self-pay

## 2020-12-14 ENCOUNTER — Encounter: Payer: Self-pay | Admitting: Internal Medicine

## 2020-12-14 ENCOUNTER — Ambulatory Visit: Payer: Medicare PPO | Admitting: Internal Medicine

## 2020-12-14 VITALS — BP 118/80 | HR 78 | Temp 97.8°F | Ht 68.0 in | Wt 198.4 lb

## 2020-12-14 DIAGNOSIS — E6609 Other obesity due to excess calories: Secondary | ICD-10-CM | POA: Diagnosis not present

## 2020-12-14 DIAGNOSIS — E785 Hyperlipidemia, unspecified: Secondary | ICD-10-CM | POA: Diagnosis not present

## 2020-12-14 DIAGNOSIS — E1169 Type 2 diabetes mellitus with other specified complication: Secondary | ICD-10-CM

## 2020-12-14 DIAGNOSIS — L989 Disorder of the skin and subcutaneous tissue, unspecified: Secondary | ICD-10-CM

## 2020-12-14 DIAGNOSIS — Z6830 Body mass index (BMI) 30.0-30.9, adult: Secondary | ICD-10-CM

## 2020-12-14 MED ORDER — LOSARTAN POTASSIUM 50 MG PO TABS: 50.0000 mg | ORAL_TABLET | Freq: Every day | ORAL | 1 refills | Status: DC

## 2020-12-14 MED ORDER — DRYSOL 20 % EX SOLN: Freq: Every day | CUTANEOUS | 1 refills | Status: DC

## 2020-12-14 NOTE — Progress Notes (Signed)
This visit occurred during the SARS-CoV-2 public health emergency.  Safety protocols were in place, including screening questions prior to the visit, additional usage of staff PPE, and extensive cleaning of exam room while observing appropriate contact time as indicated for disinfecting solutions.  Subjective:     Patient ID: Felicia Garrett , female    DOB: 1956-06-10 , 65 y.o.   MRN: [id number]   Chief Complaint  Patient presents with  . Diabetes    HPI  She is here today for diabetes f/u.  She reports compliance with meds. She lives an active lifestyle.   Diabetes She presents for her follow-up diabetic visit. She has type 2 diabetes mellitus. Her disease course has been stable. There are no hypoglycemic associated symptoms. There are no hypoglycemic complications. Risk factors for coronary artery disease include diabetes mellitus, dyslipidemia, hypertension, obesity and post-menopausal. She participates in exercise every other day. Eye exam is current.     Past Medical History:  Diagnosis Date  . Anemia   . Arthritis   . Depression   . Diabetes mellitus   . Hyperlipidemia   . Thyroid nodule      Family History  Problem Relation Age of Onset  . Hypertension Mother   . Hyperlipidemia Mother   . Hyperthyroidism Mother   . Diabetes Father      Current Outpatient Medications:  .  aluminum chloride (DRYSOL) 20 % external solution, Apply topically at bedtime., Disp: 60 mL, Rfl: 1 .  buPROPion (WELLBUTRIN XL) 150 MG 24 hr tablet, TAKE 1 TABLET BY MOUTH EVERY DAY, Disp: 90 tablet, Rfl: 2 .  Iron-FA-B Cmp-C-Biot-Probiotic (FUSION PLUS) CAPS, TAKE 1 CAPSULE BY ORAL ROUTE EVERY DAY BETWEEN MEALS, Disp: 30 capsule, Rfl: 1 .  levothyroxine (SYNTHROID) 137 MCG tablet, TAKE 1 TABLET (137 MCG TOTAL) BY MOUTH DAILY BEFORE BREAKFAST. MONDAY - FRIDAY (Patient taking differently: Take 137 mcg by mouth daily before breakfast. Monday - Sunday), Disp: 80 tablet, Rfl: 2 .  losartan (COZAAR) 50  MG tablet, Take 1 tablet (50 mg total) by mouth daily., Disp: 90 tablet, Rfl: 1 .  methocarbamol (ROBAXIN-750) 750 MG tablet, Take 1 tablet (750 mg total) by mouth 3 (three) times daily., Disp: 40 tablet, Rfl: 1 .  nystatin cream (MYCOSTATIN), APPLY TO AFFECTED AREA TWICE A DAY, Disp: , Rfl:  .  oxybutynin (DITROPAN) 5 MG tablet, oxybutynin chloride 5 mg tablet  TAKE 1 TABLET BY MOUTH TWICE A DAY AS NEEDED, Disp: , Rfl:  .  Semaglutide, 1 MG/DOSE, (OZEMPIC, 1 MG/DOSE,) 4 MG/3ML SOPN, Inject 1 mg into the skin once a week., Disp: 9 mL, Rfl: 1 .  simvastatin (ZOCOR) 80 MG tablet, Take 1 tablet (80 mg total) by mouth every evening., Disp: 90 tablet, Rfl: 2 .  XIGDUO XR 01-999 MG TB24, TAKE 1 TABLET BY ORAL ROUTE 2 TIMES EVERY DAY IN THE MORNING WITH FOOD, Disp: 60 tablet, Rfl: 2 .  Chlorphen-PE-Acetaminophen (NOREL AD) 4-10-325 MG TABS, Take 4-10 mg by mouth 2 (two) times daily as needed. (Patient not taking: Reported on 08/04/2020), Disp: 20 tablet, Rfl: 0 .  levothyroxine (SYNTHROID) 150 MCG tablet, TAKE 1 TABLET BY MOUTH SATURDAY - SUNDAY (Patient not taking: No sig reported), Disp: 24 tablet, Rfl: 2   Allergies  Allergen Reactions  . Penicillins Shortness Of Breath    CHEST PAIN     Review of Systems  Constitutional: Negative.   Respiratory: Negative.   Cardiovascular: Negative.   Gastrointestinal: Negative.   Neurological: Negative.  Psychiatric/Behavioral: Negative.      Today's Vitals   12/14/20 1134  BP: 118/80  Pulse: 78  Temp: 97.8 F (36.6 C)  SpO2: 99%  Weight: 198 lb 6.4 oz (90 kg)  Height: 5' 8"  (1.727 m)  PainSc: 0-No pain   Body mass index is 30.17 kg/m.  Wt Readings from Last 3 Encounters:  12/14/20 198 lb 6.4 oz (90 kg)  08/04/20 201 lb (91.2 kg)  08/04/20 201 lb (91.2 kg)    Objective:  Physical Exam Vitals and nursing note reviewed.  Constitutional:      Appearance: Normal appearance.  HENT:     Head: Normocephalic and atraumatic.     Nose:      Comments: Masked     Mouth/Throat:     Comments: Masked  Eyes:     Extraocular Movements: Extraocular movements intact.  Cardiovascular:     Rate and Rhythm: Normal rate and regular rhythm.     Heart sounds: Normal heart sounds.  Pulmonary:     Effort: Pulmonary effort is normal.     Breath sounds: Normal breath sounds.  Musculoskeletal:     Cervical back: Normal range of motion.  Skin:    General: Skin is warm.     Comments: Hyperpigmented,irregularly shaped lesion on top of left foot  Subungual hyperpigmentation left thumb  Neurological:     General: No focal deficit present.     Mental Status: She is alert.  Psychiatric:        Mood and Affect: Mood normal.        Behavior: Behavior normal.         Assessment And Plan:     1. Type 2 diabetes mellitus with hyperlipidemia (HCC) Comments: Chronic, I will check labs as listed below. She is up to date with all metrics.  - BMP8+EGFR - Hemoglobin A1c  2. Skin lesion Comments: I will refer her to Derm for further evaluation.  - Ambulatory referral to Dermatology  3. Class 1 obesity due to excess calories with serious comorbidity and body mass index (BMI) of 30.0 to 30.9 in adult Her BMI is acceptable for her demographic. She is encouraged to continue with her active lifestyle, aiming for at least 150 minutes of exercise per week.    Patient was given opportunity to ask questions. Patient verbalized understanding of the plan and was able to repeat key elements of the plan. All questions were answered to their satisfaction.   I, Maximino Greenland, MD, have reviewed all documentation for this visit. The documentation on 12/14/20 for the exam, diagnosis, procedures, and orders are all accurate and complete.   IF YOU HAVE BEEN REFERRED TO A SPECIALIST, IT MAY TAKE 1-2 WEEKS TO SCHEDULE/PROCESS THE REFERRAL. IF YOU HAVE NOT HEARD FROM US/SPECIALIST IN TWO WEEKS, PLEASE GIVE [place] A CALL AT [phone] X [phone].   THE PATIENT IS  ENCOURAGED TO PRACTICE SOCIAL DISTANCING DUE TO THE COVID-19 PANDEMIC.

## 2020-12-14 NOTE — Progress Notes (Signed)
I,Blondina Coderre,acting as a Neurosurgeon for [name], MD.,have documented all relevant documentation on the behalf of [name], MD,as directed by  [name], MD while in the presence of [name], MD.  This visit occurred during the SARS-CoV-2 public health emergency.  Safety protocols were in place, including screening questions prior to the visit, additional usage of staff PPE, and extensive cleaning of exam room while observing appropriate contact time as indicated for disinfecting solutions.  Subjective:     Patient ID: Felicia Garrett , female    DOB: 04-03-1956 , 65 y.o.   MRN: [id number]   Chief Complaint  Patient presents with  . Diabetes  . Hypertension    HPI  Diabetes She presents for her follow-up diabetic visit. She has type 2 diabetes mellitus. Her disease course has been stable. There are no hypoglycemic associated symptoms. Pertinent negatives for diabetes include no blurred vision and no chest pain. There are no hypoglycemic complications. Risk factors for coronary artery disease include diabetes mellitus, dyslipidemia, hypertension, obesity and post-menopausal. She participates in exercise every other day. Eye exam is current.  Hypertension This is a chronic problem. The current episode started more than 1 year ago. The problem has been gradually improving since onset. The problem is controlled. Pertinent negatives include no blurred vision, chest pain, palpitations or shortness of breath. There are no compliance problems.      Past Medical History:  Diagnosis Date  . Anemia   . Arthritis   . Depression   . Diabetes mellitus   . Hyperlipidemia   . Thyroid nodule      Family History  Problem Relation Age of Onset  . Hypertension Mother   . Hyperlipidemia Mother   . Hyperthyroidism Mother   . Diabetes Father      Current Outpatient Medications:  .  buPROPion (WELLBUTRIN XL) 150 MG 24 hr tablet, TAKE 1 TABLET BY MOUTH EVERY DAY, Disp: 90  tablet, Rfl: 2 .  Chlorphen-PE-Acetaminophen (NOREL AD) 4-10-325 MG TABS, Take 4-10 mg by mouth 2 (two) times daily as needed. (Patient not taking: Reported on 08/04/2020), Disp: 20 tablet, Rfl: 0 .  Iron-FA-B Cmp-C-Biot-Probiotic (FUSION PLUS) CAPS, TAKE 1 CAPSULE BY ORAL ROUTE EVERY DAY BETWEEN MEALS, Disp: 30 capsule, Rfl: 1 .  levothyroxine (SYNTHROID) 137 MCG tablet, TAKE 1 TABLET (137 MCG TOTAL) BY MOUTH DAILY BEFORE BREAKFAST. MONDAY - FRIDAY (Patient taking differently: Take 137 mcg by mouth daily before breakfast. Monday - Sunday), Disp: 80 tablet, Rfl: 2 .  levothyroxine (SYNTHROID) 150 MCG tablet, TAKE 1 TABLET BY MOUTH SATURDAY - SUNDAY (Patient not taking: Reported on 08/04/2020), Disp: 24 tablet, Rfl: 2 .  losartan-hydrochlorothiazide (HYZAAR) 50-12.5 MG tablet, TAKE 1 TABLET BY MOUTH EVERY DAY, Disp: 90 tablet, Rfl: 2 .  methocarbamol (ROBAXIN-750) 750 MG tablet, Take 1 tablet (750 mg total) by mouth 3 (three) times daily., Disp: 40 tablet, Rfl: 1 .  nystatin cream (MYCOSTATIN), APPLY TO AFFECTED AREA TWICE A DAY, Disp: , Rfl:  .  oxybutynin (DITROPAN) 5 MG tablet, oxybutynin chloride 5 mg tablet  TAKE 1 TABLET BY MOUTH TWICE A DAY AS NEEDED, Disp: , Rfl:  .  Semaglutide, 1 MG/DOSE, (OZEMPIC, 1 MG/DOSE,) 4 MG/3ML SOPN, Inject 1 mg into the skin once a week., Disp: 9 mL, Rfl: 1 .  simvastatin (ZOCOR) 80 MG tablet, Take 1 tablet (80 mg total) by mouth every evening., Disp: 90 tablet, Rfl: 2 .  XIGDUO XR 01-999 MG TB24, TAKE 1 TABLET  BY ORAL ROUTE 2 TIMES EVERY DAY IN THE MORNING WITH FOOD, Disp: 60 tablet, Rfl: 2   Allergies  Allergen Reactions  . Penicillins Shortness Of Breath    CHEST PAIN     Review of Systems  Eyes: Negative for blurred vision.  Respiratory: Negative for shortness of breath.   Cardiovascular: Negative for chest pain and palpitations.     There were no vitals filed for this visit. There is no height or weight on file to calculate BMI.   Objective:   Physical Exam      Assessment And Plan:     There are no diagnoses linked to this encounter.    Patient was given opportunity to ask questions. Patient verbalized understanding of the plan and was able to repeat key elements of the plan. All questions were answered to their satisfaction.  Mariam Dollar, CMA   I, Mariam Dollar, CMA, have reviewed all documentation for this visit. The documentation on 12/14/20 for the exam, diagnosis, procedures, and orders are all accurate and complete.   IF YOU HAVE BEEN REFERRED TO A SPECIALIST, IT MAY TAKE 1-2 WEEKS TO SCHEDULE/PROCESS THE REFERRAL. IF YOU HAVE NOT HEARD FROM US/SPECIALIST IN TWO WEEKS, PLEASE GIVE [place] A CALL AT [phone].   THE PATIENT IS ENCOURAGED TO PRACTICE SOCIAL DISTANCING DUE TO THE COVID-19 PANDEMIC.

## 2020-12-15 ENCOUNTER — Encounter: Payer: Self-pay | Admitting: Internal Medicine

## 2020-12-15 LAB — BMP8+EGFR
BUN/Creatinine Ratio: 14 (ref 12–28)
BUN: 13 mg/dL (ref 8–27)
CO2: 23 mmol/L (ref 20–29)
Calcium: 9.5 mg/dL (ref 8.7–10.3)
Chloride: 100 mmol/L (ref 96–106)
Creatinine, Ser: 0.92 mg/dL (ref 0.57–1.00)
Glucose: 112 mg/dL — ABNORMAL HIGH (ref 65–99)
Potassium: 4.3 mmol/L (ref 3.5–5.2)
Sodium: 139 mmol/L (ref 134–144)
eGFR: 69 mL/min/{1.73_m2} (ref >59)

## 2020-12-15 LAB — HEMOGLOBIN A1C
Est. average glucose Bld gHb Est-mCnc: 134 mg/dL
Hgb A1c MFr Bld: 6.3 % — ABNORMAL HIGH (ref 4.8–5.6)

## 2020-12-21 ENCOUNTER — Ambulatory Visit: Payer: Medicare PPO | Admitting: Internal Medicine

## 2020-12-25 ENCOUNTER — Other Ambulatory Visit: Payer: Self-pay | Admitting: Internal Medicine

## 2021-01-02 ENCOUNTER — Ambulatory Visit: Payer: Medicare PPO | Admitting: Internal Medicine

## 2021-01-31 ENCOUNTER — Encounter: Payer: Self-pay | Admitting: Internal Medicine

## 2021-01-31 DIAGNOSIS — L608 Other nail disorders: Secondary | ICD-10-CM | POA: Diagnosis not present

## 2021-01-31 DIAGNOSIS — D485 Neoplasm of uncertain behavior of skin: Secondary | ICD-10-CM | POA: Diagnosis not present

## 2021-01-31 DIAGNOSIS — L821 Other seborrheic keratosis: Secondary | ICD-10-CM | POA: Diagnosis not present

## 2021-02-12 ENCOUNTER — Other Ambulatory Visit: Payer: Self-pay | Admitting: Internal Medicine

## 2021-03-13 ENCOUNTER — Other Ambulatory Visit: Payer: Self-pay | Admitting: Internal Medicine

## 2021-04-10 ENCOUNTER — Ambulatory Visit (INDEPENDENT_AMBULATORY_CARE_PROVIDER_SITE_OTHER): Payer: Medicare PPO | Admitting: Internal Medicine

## 2021-04-10 ENCOUNTER — Encounter: Payer: Self-pay | Admitting: Internal Medicine

## 2021-04-10 ENCOUNTER — Other Ambulatory Visit: Payer: Self-pay

## 2021-04-10 VITALS — BP 118/74 | HR 76 | Temp 98.2°F | Ht 68.8 in | Wt 194.8 lb

## 2021-04-10 DIAGNOSIS — E1169 Type 2 diabetes mellitus with other specified complication: Secondary | ICD-10-CM | POA: Diagnosis not present

## 2021-04-10 DIAGNOSIS — E785 Hyperlipidemia, unspecified: Secondary | ICD-10-CM

## 2021-04-10 DIAGNOSIS — E663 Overweight: Secondary | ICD-10-CM

## 2021-04-10 DIAGNOSIS — Z6828 Body mass index (BMI) 28.0-28.9, adult: Secondary | ICD-10-CM | POA: Diagnosis not present

## 2021-04-10 LAB — CMP14+EGFR
ALT: 10 IU/L (ref 0–32)
AST: 12 IU/L (ref 0–40)
Albumin/Globulin Ratio: 1.8 (ref 1.2–2.2)
Albumin: 4.2 g/dL (ref 3.8–4.8)
Alkaline Phosphatase: 44 IU/L (ref 44–121)
BUN/Creatinine Ratio: 11 — ABNORMAL LOW (ref 12–28)
BUN: 10 mg/dL (ref 8–27)
Bilirubin Total: 0.6 mg/dL (ref 0.0–1.2)
CO2: 25 mmol/L (ref 20–29)
Calcium: 9.3 mg/dL (ref 8.7–10.3)
Chloride: 102 mmol/L (ref 96–106)
Creatinine, Ser: 0.88 mg/dL (ref 0.57–1.00)
Globulin, Total: 2.3 g/dL (ref 1.5–4.5)
Glucose: 96 mg/dL (ref 65–99)
Potassium: 3.9 mmol/L (ref 3.5–5.2)
Sodium: 141 mmol/L (ref 134–144)
Total Protein: 6.5 g/dL (ref 6.0–8.5)
eGFR: 72 mL/min/{1.73_m2} (ref >59)

## 2021-04-10 LAB — HEMOGLOBIN A1C
Est. average glucose Bld gHb Est-mCnc: 134 mg/dL
Hgb A1c MFr Bld: 6.3 % — ABNORMAL HIGH (ref 4.8–5.6)

## 2021-04-10 MED ORDER — OZEMPIC (1 MG/DOSE) 4 MG/3ML ~~LOC~~ SOPN: 1.0000 mg | PEN_INJECTOR | SUBCUTANEOUS | 1 refills | Status: DC

## 2021-04-10 MED ORDER — XIGDUO XR 5-1000 MG PO TB24: ORAL_TABLET | ORAL | 2 refills | Status: DC

## 2021-04-10 MED ORDER — SIMVASTATIN 80 MG PO TABS: 80.0000 mg | ORAL_TABLET | Freq: Every evening | ORAL | 2 refills | Status: DC

## 2021-04-10 NOTE — Patient Instructions (Signed)
Health Maintenance, Female Adopting a healthy lifestyle and getting preventive care are important in promoting health and wellness. Ask your health care provider about: The right schedule for you to have regular tests and exams. Things you can do on your own to prevent diseases and keep yourself healthy. What should I know about diet, weight, and exercise? Eat a healthy diet  Eat a diet that includes plenty of vegetables, fruits, low-fat dairy products, and lean protein. Do not eat a lot of foods that are high in solid fats, added sugars, or sodium.  Maintain a healthy weight Body mass index (BMI) is used to identify weight problems. It estimates body fat based on height and weight. Your health care provider can help determineyour BMI and help you achieve or maintain a healthy weight. Get regular exercise Get regular exercise. This is one of the most important things you can do for your health. Most adults should: Exercise for at least 150 minutes each week. The exercise should increase your heart rate and make you sweat (moderate-intensity exercise). Do strengthening exercises at least twice a week. This is in addition to the moderate-intensity exercise. Spend less time sitting. Even light physical activity can be beneficial. Watch cholesterol and blood lipids Have your blood tested for lipids and cholesterol at 67 years of age, then havethis test every 5 years. Have your cholesterol levels checked more often if: Your lipid or cholesterol levels are high. You are older than 67 years of age. You are at high risk for heart disease. What should I know about cancer screening? Depending on your health history and family history, you may need to have cancer screening at various ages. This may include screening for: Breast cancer. Cervical cancer. Colorectal cancer. Skin cancer. Lung cancer. What should I know about heart disease, diabetes, and high blood pressure? Blood pressure and heart  disease High blood pressure causes heart disease and increases the risk of stroke. This is more likely to develop in people who have high blood pressure readings, are of African descent, or are overweight. Have your blood pressure checked: Every 3-5 years if you are 18-39 years of age. Every year if you are 40 years old or older. Diabetes Have regular diabetes screenings. This checks your fasting blood sugar level. Have the screening done: Once every three years after age 40 if you are at a normal weight and have a low risk for diabetes. More often and at a younger age if you are overweight or have a high risk for diabetes. What should I know about preventing infection? Hepatitis B If you have a higher risk for hepatitis B, you should be screened for this virus. Talk with your health care provider to find out if you are at risk forhepatitis B infection. Hepatitis C Testing is recommended for: Everyone born from 1945 through 1965. Anyone with known risk factors for hepatitis C. Sexually transmitted infections (STIs) Get screened for STIs, including gonorrhea and chlamydia, if: You are sexually active and are younger than 67 years of age. You are older than 67 years of age and your health care provider tells you that you are at risk for this type of infection. Your sexual activity has changed since you were last screened, and you are at increased risk for chlamydia or gonorrhea. Ask your health care provider if you are at risk. Ask your health care provider about whether you are at high risk for HIV. Your health care provider may recommend a prescription medicine to help   prevent HIV infection. If you choose to take medicine to prevent HIV, you should first get tested for HIV. You should then be tested every 3 months for as long as you are taking the medicine. Pregnancy If you are about to stop having your period (premenopausal) and you may become pregnant, seek counseling before you get  pregnant. Take 400 to 800 micrograms (mcg) of folic acid every day if you become pregnant. Ask for birth control (contraception) if you want to prevent pregnancy. Osteoporosis and menopause Osteoporosis is a disease in which the bones lose minerals and strength with aging. This can result in bone fractures. If you are 65 years old or older, or if you are at risk for osteoporosis and fractures, ask your health care provider if you should: Be screened for bone loss. Take a calcium or vitamin D supplement to lower your risk of fractures. Be given hormone replacement therapy (HRT) to treat symptoms of menopause. Follow these instructions at home: Lifestyle Do not use any products that contain nicotine or tobacco, such as cigarettes, e-cigarettes, and chewing tobacco. If you need help quitting, ask your health care provider. Do not use street drugs. Do not share needles. Ask your health care provider for help if you need support or information about quitting drugs. Alcohol use Do not drink alcohol if: Your health care provider tells you not to drink. You are pregnant, may be pregnant, or are planning to become pregnant. If you drink alcohol: Limit how much you use to 0-1 drink a day. Limit intake if you are breastfeeding. Be aware of how much alcohol is in your drink. In the [place] one drink equals one 12 oz bottle of beer (355 mL), one 5 oz glass of wine (148 mL), or one 1 oz glass of hard liquor (44 mL). General instructions Schedule regular health, dental, and eye exams. Stay current with your vaccines. Tell your health care provider if: You often feel depressed. You have ever been abused or do not feel safe at home. Summary Adopting a healthy lifestyle and getting preventive care are important in promoting health and wellness. Follow your health care provider's instructions about healthy diet, exercising, and getting tested or screened for diseases. Follow your health care provider's  instructions on monitoring your cholesterol and blood pressure. This information is not intended to replace advice given to you by your health care provider. Make sure you discuss any questions you have with your healthcare provider. Document Revised: 09/03/2018 Document Reviewed: 09/03/2018 Elsevier Patient Education  2022 [employer]  

## 2021-04-10 NOTE — Progress Notes (Signed)
I,Katawbba Wiggins,acting as a Education administrator for Maximino Greenland, MD.,have documented all relevant documentation on the behalf of Maximino Greenland, MD,as directed by  Maximino Greenland, MD while in the presence of Maximino Greenland, MD.  This visit occurred during the SARS-CoV-2 public health emergency.  Safety protocols were in place, including screening questions prior to the visit, additional usage of staff PPE, and extensive cleaning of exam room while observing appropriate contact time as indicated for disinfecting solutions.  Subjective:     Patient ID: Felicia Garrett , female    DOB: [birth year] , 65 y.o.   MRN: [id number]   Chief Complaint  Patient presents with   Diabetes    HPI  She is here today for diabetes f/u.    Diabetes She presents for her follow-up diabetic visit. She has type 2 diabetes mellitus. Her disease course has been stable. There are no hypoglycemic associated symptoms. There are no hypoglycemic complications. Risk factors for coronary artery disease include diabetes mellitus, dyslipidemia, hypertension, obesity and post-menopausal. She participates in exercise every other day. Eye exam is current.    Past Medical History:  Diagnosis Date   Anemia    Arthritis    Depression    Diabetes mellitus    Hyperlipidemia    Thyroid nodule      Family History  Problem Relation Age of Onset   Hypertension Mother    Hyperlipidemia Mother    Hyperthyroidism Mother    Diabetes Father      Current Outpatient Medications:    aluminum chloride (DRYSOL) 20 % external solution, Apply topically at bedtime., Disp: 60 mL, Rfl: 1   buPROPion (WELLBUTRIN XL) 150 MG 24 hr tablet, TAKE 1 TABLET BY MOUTH EVERY DAY, Disp: 90 tablet, Rfl: 2   Iron-FA-B Cmp-C-Biot-Probiotic (FUSION PLUS) CAPS, TAKE 1 CAPSULE BY ORAL ROUTE EVERY DAY BETWEEN MEALS, Disp: 30 capsule, Rfl: 1   levothyroxine (SYNTHROID) 137 MCG tablet, TAKE 1 TABLET (137 MCG TOTAL) BY MOUTH DAILY BEFORE BREAKFAST. MONDAY -  FRIDAY (Patient taking differently: Take 137 mcg by mouth daily before breakfast. Monday - Saturday), Disp: 80 tablet, Rfl: 2   losartan (COZAAR) 50 MG tablet, Take 1 tablet (50 mg total) by mouth daily., Disp: 90 tablet, Rfl: 1   methocarbamol (ROBAXIN-750) 750 MG tablet, Take 1 tablet (750 mg total) by mouth 3 (three) times daily., Disp: 40 tablet, Rfl: 1   nystatin cream (MYCOSTATIN), APPLY TO AFFECTED AREA TWICE A DAY, Disp: , Rfl:    oxybutynin (DITROPAN) 5 MG tablet, prn, Disp: , Rfl:    Dapagliflozin-metFORMIN HCl ER (XIGDUO XR) 01-999 MG TB24, Take 2 tabs po qam, Disp: 180 tablet, Rfl: 2   Semaglutide, 1 MG/DOSE, (OZEMPIC, 1 MG/DOSE,) 4 MG/3ML SOPN, Inject 1 mg into the skin once a week., Disp: 9 mL, Rfl: 1   simvastatin (ZOCOR) 80 MG tablet, Take 1 tablet (80 mg total) by mouth every evening., Disp: 90 tablet, Rfl: 2   Allergies  Allergen Reactions   Penicillins Shortness Of Breath    CHEST PAIN     Review of Systems  Constitutional: Negative.   Respiratory: Negative.    Cardiovascular: Negative.   Gastrointestinal: Negative.   Psychiatric/Behavioral: Negative.    All other systems reviewed and are negative.   Today's Vitals   04/10/21 0926  BP: 118/74  Pulse: 76  Temp: 98.2 F (36.8 C)  TempSrc: Oral  Weight: 194 lb 12.8 oz (88.4 kg)  Height: 5' 8.8" (1.748 m)  PainSc: 0-No  pain   Body mass index is 28.93 kg/m.  Wt Readings from Last 3 Encounters:  04/10/21 194 lb 12.8 oz (88.4 kg)  12/14/20 198 lb 6.4 oz (90 kg)  08/04/20 201 lb (91.2 kg)    BP Readings from Last 3 Encounters:  04/10/21 118/74  12/14/20 118/80  08/04/20 114/62    Objective:  Physical Exam Vitals and nursing note reviewed.  Constitutional:      Appearance: Normal appearance.  HENT:     Head: Normocephalic and atraumatic.     Nose:     Comments: Masked     Mouth/Throat:     Comments: Masked  Eyes:     Extraocular Movements: Extraocular movements intact.  Cardiovascular:     Rate  and Rhythm: Normal rate and regular rhythm.     Heart sounds: Normal heart sounds.  Pulmonary:     Effort: Pulmonary effort is normal.     Breath sounds: Normal breath sounds.  Skin:    General: Skin is warm.  Neurological:     General: No focal deficit present.     Mental Status: She is alert.  Psychiatric:        Mood and Affect: Mood normal.        Behavior: Behavior normal.        Assessment And Plan:     1. Type 2 diabetes mellitus with hyperlipidemia (HCC) Comments: Chronic, I will check labs as listed below. I will not make any med changes today. She will f/u in Nov 2022 for her physical exam. I will contact her local pharmacy to confirm date of administration of Prevnar-13.  - CMP14+EGFR - Hemoglobin A1c  2. Overweight with body mass index (BMI) of 28 to 28.9 in adult  Her BMI is acceptable for her demographic. She is advised to aim for at least 150 minutes of exercise per week.   Patient was given opportunity to ask questions. Patient verbalized understanding of the plan and was able to repeat key elements of the plan. All questions were answered to their satisfaction.   I, Maximino Greenland, MD, have reviewed all documentation for this visit. The documentation on 04/10/21 for the exam, diagnosis, procedures, and orders are all accurate and complete.   IF YOU HAVE BEEN REFERRED TO A SPECIALIST, IT MAY TAKE 1-2 WEEKS TO SCHEDULE/PROCESS THE REFERRAL. IF YOU HAVE NOT HEARD FROM US/SPECIALIST IN TWO WEEKS, PLEASE GIVE [place] A CALL AT [phone].   THE PATIENT IS ENCOURAGED TO PRACTICE SOCIAL DISTANCING DUE TO THE COVID-19 PANDEMIC.

## 2021-04-25 DIAGNOSIS — Z23 Encounter for immunization: Secondary | ICD-10-CM | POA: Diagnosis not present

## 2021-05-23 ENCOUNTER — Other Ambulatory Visit: Payer: Self-pay | Admitting: Internal Medicine

## 2021-06-08 ENCOUNTER — Other Ambulatory Visit: Payer: Self-pay | Admitting: Internal Medicine

## 2021-06-14 DIAGNOSIS — Z23 Encounter for immunization: Secondary | ICD-10-CM | POA: Diagnosis not present

## 2021-06-22 ENCOUNTER — Telehealth: Payer: Self-pay | Admitting: Internal Medicine

## 2021-06-22 NOTE — Telephone Encounter (Signed)
I spoke with patient to schedule AWV.  Patient declined stating  she didn't need appointment.

## 2021-07-18 DIAGNOSIS — Z1231 Encounter for screening mammogram for malignant neoplasm of breast: Secondary | ICD-10-CM | POA: Diagnosis not present

## 2021-07-18 LAB — HM MAMMOGRAPHY

## 2021-07-21 ENCOUNTER — Encounter: Payer: Self-pay | Admitting: Internal Medicine

## 2021-07-22 ENCOUNTER — Other Ambulatory Visit: Payer: Self-pay | Admitting: Internal Medicine

## 2021-08-21 ENCOUNTER — Encounter: Payer: Medicare PPO | Admitting: Internal Medicine

## 2021-08-23 ENCOUNTER — Other Ambulatory Visit: Payer: Self-pay

## 2021-08-23 ENCOUNTER — Ambulatory Visit (INDEPENDENT_AMBULATORY_CARE_PROVIDER_SITE_OTHER): Payer: Medicare PPO | Admitting: Internal Medicine

## 2021-08-23 ENCOUNTER — Encounter: Payer: Self-pay | Admitting: Internal Medicine

## 2021-08-23 VITALS — BP 106/60 | HR 90 | Temp 99.1°F | Ht 68.8 in | Wt 190.2 lb

## 2021-08-23 DIAGNOSIS — Z0000 Encounter for general adult medical examination without abnormal findings: Secondary | ICD-10-CM | POA: Diagnosis not present

## 2021-08-23 DIAGNOSIS — E663 Overweight: Secondary | ICD-10-CM | POA: Diagnosis not present

## 2021-08-23 DIAGNOSIS — E785 Hyperlipidemia, unspecified: Secondary | ICD-10-CM

## 2021-08-23 DIAGNOSIS — I10 Essential (primary) hypertension: Secondary | ICD-10-CM

## 2021-08-23 DIAGNOSIS — R718 Other abnormality of red blood cells: Secondary | ICD-10-CM | POA: Diagnosis not present

## 2021-08-23 DIAGNOSIS — E1169 Type 2 diabetes mellitus with other specified complication: Secondary | ICD-10-CM | POA: Diagnosis not present

## 2021-08-23 DIAGNOSIS — Z6828 Body mass index (BMI) 28.0-28.9, adult: Secondary | ICD-10-CM | POA: Diagnosis not present

## 2021-08-23 LAB — POCT URINALYSIS DIPSTICK
Bilirubin, UA: NEGATIVE
Glucose, UA: POSITIVE — AB
Ketones, UA: NEGATIVE
Leukocytes, UA: NEGATIVE
Nitrite, UA: NEGATIVE
Protein, UA: NEGATIVE
Spec Grav, UA: 1.025 (ref 1.010–1.025)
Urobilinogen, UA: 0.2 E.U./dL
pH, UA: 5.5 (ref 5.0–8.0)

## 2021-08-23 LAB — POCT UA - MICROALBUMIN
Albumin/Creatinine Ratio, Urine, POC: <30
Creatinine, POC: 300 mg/dL
Microalbumin Ur, POC: 30 mg/L

## 2021-08-23 MED ORDER — MOUNJARO 5 MG/0.5ML ~~LOC~~ SOAJ: 5.0000 mg | SUBCUTANEOUS | 1 refills | Status: DC

## 2021-08-23 NOTE — Progress Notes (Signed)
I,Tianna Badgett,acting as a Education administrator for Maximino Greenland, MD.,have documented all relevant documentation on the behalf of Maximino Greenland, MD,as directed by  Maximino Greenland, MD while in the presence of Maximino Greenland, MD.  This visit occurred during the SARS-CoV-2 public health emergency.  Safety protocols were in place, including screening questions prior to the visit, additional usage of staff PPE, and extensive cleaning of exam room while observing appropriate contact time as indicated for disinfecting solutions.  Subjective:     Patient ID: Felicia Garrett , female    DOB: 05/06/1956 , 65 y.o.   MRN: [id number]   Chief Complaint  Patient presents with   Annual Exam   Diabetes    HPI  She is here today for a full physical examination. She is followed by Dr. Mancel Bale for her GYN exams. She reports compliance with meds. She has no specific concerns or complaints at this time.   Diabetes She presents for her follow-up diabetic visit. She has type 2 diabetes mellitus. Her disease course has been stable. There are no hypoglycemic associated symptoms. There are no hypoglycemic complications. Risk factors for coronary artery disease include diabetes mellitus, hypertension and post-menopausal. She is following a diabetic diet. An ACE inhibitor/angiotensin II receptor blocker is being taken.    Past Medical History:  Diagnosis Date   Anemia    Arthritis    Depression    Diabetes mellitus    Hyperlipidemia    Thyroid nodule      Family History  Problem Relation Age of Onset   Hypertension Mother    Hyperlipidemia Mother    Hyperthyroidism Mother    Diabetes Father      Current Outpatient Medications:    buPROPion (WELLBUTRIN XL) 150 MG 24 hr tablet, TAKE 1 TABLET BY MOUTH EVERY DAY, Disp: 90 tablet, Rfl: 2   Dapagliflozin-metFORMIN HCl ER (XIGDUO XR) 01-999 MG TB24, Take 2 tabs po qam, Disp: 180 tablet, Rfl: 2   Iron-FA-B Cmp-C-Biot-Probiotic (FUSION PLUS) CAPS, TAKE 1 CAPSULE BY  ORAL ROUTE EVERY DAY BETWEEN MEALS, Disp: 30 capsule, Rfl: 1   levothyroxine (SYNTHROID) 137 MCG tablet, TAKE 1 TABLET (137 MCG TOTAL) BY MOUTH DAILY BEFORE BREAKFAST. MONDAY - FRIDAY (Patient taking differently: Take 137 mcg by mouth daily before breakfast. Monday - Saturday), Disp: 80 tablet, Rfl: 2   losartan (COZAAR) 50 MG tablet, Take 1 tablet (50 mg total) by mouth daily., Disp: 90 tablet, Rfl: 1   methocarbamol (ROBAXIN-750) 750 MG tablet, Take 1 tablet (750 mg total) by mouth 3 (three) times daily., Disp: 40 tablet, Rfl: 1   nystatin cream (MYCOSTATIN), APPLY TO AFFECTED AREA TWICE A DAY, Disp: , Rfl:    simvastatin (ZOCOR) 80 MG tablet, Take 1 tablet (80 mg total) by mouth every evening., Disp: 90 tablet, Rfl: 2   tirzepatide (MOUNJARO) 5 MG/0.5ML Pen, Inject 5 mg into the skin once a week., Disp: 0.5 mL, Rfl: 1   Allergies  Allergen Reactions   Penicillins Shortness Of Breath    CHEST PAIN      The patient states she uses post menopausal status for birth control. Last LMP was No LMP recorded. Patient is postmenopausal.. Negative for Dysmenorrhea. Negative for: breast discharge, breast lump(s), breast pain and breast self exam. Associated symptoms include abnormal vaginal bleeding. Pertinent negatives include abnormal bleeding (hematology), anxiety, decreased libido, depression, difficulty falling sleep, dyspareunia, history of infertility, nocturia, sexual dysfunction, sleep disturbances, urinary incontinence, urinary urgency, vaginal discharge and vaginal itching. Diet regular.The  patient states her exercise level is  moderate.  . The patient's tobacco use is:  Social History   Tobacco Use  Smoking Status Never  Smokeless Tobacco Never  . She has been exposed to passive smoke. The patient's alcohol use is:  Social History   Substance and Sexual Activity  Alcohol Use No   Review of Systems  Constitutional: Negative.   HENT: Negative.    Eyes: Negative.   Respiratory:  Negative.    Cardiovascular: Negative.   Gastrointestinal: Negative.   Endocrine: Negative.   Genitourinary: Negative.   Musculoskeletal: Negative.   Skin: Negative.   Allergic/Immunologic: Negative.   Neurological: Negative.   Hematological: Negative.   Psychiatric/Behavioral: Negative.      Today's Vitals   08/23/21 1439  BP: 106/60  Pulse: 90  Temp: 99.1 F (37.3 C)  TempSrc: Oral  Weight: 190 lb 3.2 oz (86.3 kg)  Height: 5' 8.8" (1.748 m)   Body mass index is 28.25 kg/m.  Wt Readings from Last 3 Encounters:  08/23/21 190 lb 3.2 oz (86.3 kg)  04/10/21 194 lb 12.8 oz (88.4 kg)  12/14/20 198 lb 6.4 oz (90 kg)    Objective:  Physical Exam Vitals and nursing note reviewed.  Constitutional:      Appearance: Normal appearance.  HENT:     Head: Normocephalic and atraumatic.     Right Ear: Tympanic membrane, ear canal and external ear normal.     Left Ear: Tympanic membrane, ear canal and external ear normal.     Nose:     Comments: Masked     Mouth/Throat:     Comments: Masked  Eyes:     Extraocular Movements: Extraocular movements intact.     Conjunctiva/sclera: Conjunctivae normal.     Pupils: Pupils are equal, round, and reactive to light.  Cardiovascular:     Rate and Rhythm: Normal rate and regular rhythm.     Pulses:          Dorsalis pedis pulses are 3+ on the right side and 3+ on the left side.     Heart sounds: Normal heart sounds.  Pulmonary:     Effort: Pulmonary effort is normal.     Breath sounds: Normal breath sounds.  Chest:  Breasts:    Tanner Score is 5.     Right: Normal.     Left: Normal.  Abdominal:     General: Bowel sounds are normal.     Palpations: Abdomen is soft.     Comments: Lap band port appreciated  Genitourinary:    Comments: deferred Musculoskeletal:        General: Normal range of motion.     Cervical back: Normal range of motion and neck supple.  Feet:     Right foot:     Protective Sensation: 5 sites tested.  5  sites sensed.     Skin integrity: Dry skin present.     Toenail Condition: Right toenails are normal.     Left foot:     Protective Sensation: 5 sites tested.  5 sites sensed.     Skin integrity: Dry skin present.     Toenail Condition: Left toenails are normal.  Skin:    General: Skin is warm and dry.  Neurological:     General: No focal deficit present.     Mental Status: She is alert and oriented to person, place, and time.  Psychiatric:        Mood and Affect: Mood normal.  Behavior: Behavior normal.        Assessment And Plan:     1. Encounter for annual physical exam Comments: A full exam was performed. Importance of monthly self breast exams was discussed with the patient.  PATIENT IS ADVISED TO GET 30-45 MINUTES REGULAR EXERCISE NO LESS THAN FOUR TO FIVE DAYS PER WEEK - BOTH WEIGHTBEARING EXERCISES AND AEROBIC ARE RECOMMENDED.  PATIENT IS ADVISED TO FOLLOW A HEALTHY DIET WITH AT LEAST SIX FRUITS/VEGGIES PER DAY, DECREASE INTAKE OF RED MEAT, AND TO INCREASE FISH INTAKE TO TWO DAYS PER WEEK.  MEATS/FISH SHOULD NOT BE FRIED, BAKED OR BROILED IS PREFERABLE.  IT IS ALSO IMPORTANT TO CUT BACK ON YOUR SUGAR INTAKE. PLEASE AVOID ANYTHING WITH ADDED SUGAR, CORN SYRUP OR OTHER SWEETENERS. IF YOU MUST USE A SWEETENER, YOU CAN TRY STEVIA. IT IS ALSO IMPORTANT TO AVOID ARTIFICIALLY SWEETENERS AND DIET BEVERAGES. LASTLY, I SUGGEST WEARING SPF 50 SUNSCREEN ON EXPOSED PARTS AND ESPECIALLY WHEN IN THE DIRECT SUNLIGHT FOR AN EXTENDED PERIOD OF TIME.  PLEASE AVOID FAST FOOD RESTAURANTS AND INCREASE YOUR WATER INTAKE.   2. Type 2 diabetes mellitus with hyperlipidemia (HCC) Comments: Chronic, diabetic foot exam was performed. She is interested in switching to [facility]. She has tolerated Ozempic. Denies personal/family h/o thyroid CA. She is reminded to stop eating when full and possible side effects. She will continue with current supply of Ozempic, wait a week, and then start Mounjaro 2.16m  weekly. I will send [id number] to the pharmacy to start PA process. She will rto in 4-6 weeks after starting the new medication. I DISCUSSED WITH THE PATIENT AT LENGTH REGARDING THE GOALS OF GLYCEMIC CONTROL AND POSSIBLE LONG-TERM COMPLICATIONS.  I  ALSO STRESSED THE IMPORTANCE OF COMPLIANCE WITH HOME GLUCOSE MONITORING, DIETARY RESTRICTIONS INCLUDING AVOIDANCE OF SUGARY DRINKS/PROCESSED FOODS,  ALONG WITH REGULAR EXERCISE.  I  ALSO STRESSED THE IMPORTANCE OF ANNUAL EYE EXAMS, SELF FOOT CARE AND COMPLIANCE WITH OFFICE VISITS.  - POCT Urinalysis Dipstick (81002) - POCT UA - Microalbumin - EKG 12-Lead - CBC - Hemoglobin A1c - CMP14+EGFR - Lipid panel  3. Overweight with body mass index (BMI) of 28 to 28.9 in adult Comments: She is encouraged to aim for at least 150 minutes of exercise per week.   Patient was given opportunity to ask questions. Patient verbalized understanding of the plan and was able to repeat key elements of the plan. All questions were answered to their satisfaction.   I, RoMaximino GreenlandMD, have reviewed all documentation for this visit. The documentation on 08/23/21 for the exam, diagnosis, procedures, and orders are all accurate and complete.   THE PATIENT IS ENCOURAGED TO PRACTICE SOCIAL DISTANCING DUE TO THE COVID-19 PANDEMIC.

## 2021-08-23 NOTE — Patient Instructions (Signed)

## 2021-08-24 ENCOUNTER — Other Ambulatory Visit: Payer: Self-pay

## 2021-08-24 ENCOUNTER — Encounter: Payer: Self-pay | Admitting: Internal Medicine

## 2021-08-24 ENCOUNTER — Telehealth: Payer: Self-pay

## 2021-08-24 DIAGNOSIS — R3121 Asymptomatic microscopic hematuria: Secondary | ICD-10-CM

## 2021-08-24 LAB — CMP14+EGFR
ALT: 14 IU/L (ref 0–32)
AST: 17 IU/L (ref 0–40)
Albumin/Globulin Ratio: 2 (ref 1.2–2.2)
Albumin: 4.4 g/dL (ref 3.8–4.8)
Alkaline Phosphatase: 47 IU/L (ref 44–121)
BUN/Creatinine Ratio: 15 (ref 12–28)
BUN: 13 mg/dL (ref 8–27)
Bilirubin Total: 0.3 mg/dL (ref 0.0–1.2)
CO2: 23 mmol/L (ref 20–29)
Calcium: 9.2 mg/dL (ref 8.7–10.3)
Chloride: 102 mmol/L (ref 96–106)
Creatinine, Ser: 0.84 mg/dL (ref 0.57–1.00)
Globulin, Total: 2.2 g/dL (ref 1.5–4.5)
Glucose: 94 mg/dL (ref 70–99)
Potassium: 4.3 mmol/L (ref 3.5–5.2)
Sodium: 140 mmol/L (ref 134–144)
Total Protein: 6.6 g/dL (ref 6.0–8.5)
eGFR: 76 mL/min/{1.73_m2} (ref >59)

## 2021-08-24 LAB — LIPID PANEL
Chol/HDL Ratio: 2.3 ratio (ref 0.0–4.4)
Cholesterol, Total: 182 mg/dL (ref 100–199)
HDL: 79 mg/dL (ref >39)
LDL Chol Calc (NIH): 91 mg/dL (ref 0–99)
Triglycerides: 61 mg/dL (ref 0–149)
VLDL Cholesterol Cal: 12 mg/dL (ref 5–40)

## 2021-08-24 LAB — CBC
Hematocrit: 38.8 % (ref 34.0–46.6)
Hemoglobin: 11.6 g/dL (ref 11.1–15.9)
MCH: 21.1 pg — ABNORMAL LOW (ref 26.6–33.0)
MCHC: 29.9 g/dL — ABNORMAL LOW (ref 31.5–35.7)
MCV: 70 fL — ABNORMAL LOW (ref 79–97)
Platelets: 291 10*3/uL (ref 150–450)
RBC: 5.51 x10E6/uL — ABNORMAL HIGH (ref 3.77–5.28)
RDW: 16 % — ABNORMAL HIGH (ref 11.7–15.4)
WBC: 3.2 10*3/uL — ABNORMAL LOW (ref 3.4–10.8)

## 2021-08-24 LAB — HEMOGLOBIN A1C
Est. average glucose Bld gHb Est-mCnc: 128 mg/dL
Hgb A1c MFr Bld: 6.1 % — ABNORMAL HIGH (ref 4.8–5.6)

## 2021-08-24 NOTE — Telephone Encounter (Signed)
Lvm to have pt return for lab visit to repeat urine in 3-5 weeks. Also sent for culture. Already in future.

## 2021-08-26 LAB — FERRITIN: Ferritin: 43 ng/mL (ref 15–150)

## 2021-08-26 LAB — SPECIMEN STATUS REPORT

## 2021-08-26 LAB — IRON AND TIBC
Iron Saturation: 11 % — ABNORMAL LOW (ref 15–55)
Iron: 34 ug/dL (ref 27–139)
Total Iron Binding Capacity: 309 ug/dL (ref 250–450)
UIBC: 275 ug/dL (ref 118–369)

## 2021-08-29 ENCOUNTER — Other Ambulatory Visit: Payer: Self-pay

## 2021-08-29 DIAGNOSIS — D72819 Decreased white blood cell count, unspecified: Secondary | ICD-10-CM

## 2021-08-30 ENCOUNTER — Other Ambulatory Visit: Payer: Medicare PPO

## 2021-08-30 ENCOUNTER — Other Ambulatory Visit: Payer: Self-pay

## 2021-08-30 DIAGNOSIS — E7800 Pure hypercholesterolemia, unspecified: Secondary | ICD-10-CM

## 2021-08-30 DIAGNOSIS — E039 Hypothyroidism, unspecified: Secondary | ICD-10-CM | POA: Diagnosis not present

## 2021-08-30 DIAGNOSIS — R3121 Asymptomatic microscopic hematuria: Secondary | ICD-10-CM | POA: Diagnosis not present

## 2021-08-31 LAB — TSH: TSH: 3.15 u[IU]/mL (ref 0.450–4.500)

## 2021-09-01 LAB — URINE CULTURE

## 2021-09-03 LAB — T4, FREE: Free T4: 1.89 ng/dL — ABNORMAL HIGH (ref 0.82–1.77)

## 2021-09-03 LAB — SPECIMEN STATUS REPORT

## 2021-09-04 ENCOUNTER — Telehealth: Payer: Self-pay | Admitting: Internal Medicine

## 2021-09-04 NOTE — Telephone Encounter (Signed)
Scheduled appt per 12/7 referral. Pt is aware of appt date and time.  

## 2021-09-05 ENCOUNTER — Other Ambulatory Visit: Payer: Self-pay | Admitting: Internal Medicine

## 2021-09-05 MED ORDER — NITROFURANTOIN MONOHYD MACRO 100 MG PO CAPS: 100.0000 mg | ORAL_CAPSULE | Freq: Two times a day (BID) | ORAL | 0 refills | Status: AC

## 2021-09-12 ENCOUNTER — Telehealth: Payer: Self-pay | Admitting: Internal Medicine

## 2021-09-12 NOTE — Telephone Encounter (Signed)
Spoke with patient to schedule AWV with Felicia Garrett.    Patient declined

## 2021-09-13 ENCOUNTER — Other Ambulatory Visit: Payer: Self-pay | Admitting: Internal Medicine

## 2021-09-13 DIAGNOSIS — D539 Nutritional anemia, unspecified: Secondary | ICD-10-CM

## 2021-09-14 ENCOUNTER — Other Ambulatory Visit: Payer: Self-pay

## 2021-09-14 ENCOUNTER — Inpatient Hospital Stay: Payer: Medicare PPO | Attending: Internal Medicine | Admitting: Internal Medicine

## 2021-09-14 ENCOUNTER — Inpatient Hospital Stay: Payer: Medicare PPO

## 2021-09-14 ENCOUNTER — Telehealth: Payer: Self-pay

## 2021-09-14 VITALS — BP 129/74 | HR 88 | Temp 97.6°F | Resp 17 | Ht 68.8 in | Wt 196.1 lb

## 2021-09-14 DIAGNOSIS — D539 Nutritional anemia, unspecified: Secondary | ICD-10-CM | POA: Diagnosis not present

## 2021-09-14 DIAGNOSIS — D72819 Decreased white blood cell count, unspecified: Secondary | ICD-10-CM | POA: Insufficient documentation

## 2021-09-14 DIAGNOSIS — D508 Other iron deficiency anemias: Secondary | ICD-10-CM

## 2021-09-14 DIAGNOSIS — D649 Anemia, unspecified: Secondary | ICD-10-CM | POA: Diagnosis not present

## 2021-09-14 LAB — FOLATE: Folate: 11.6 ng/mL (ref >5.9)

## 2021-09-14 LAB — HIV ANTIBODY (ROUTINE TESTING W REFLEX): HIV Screen 4th Generation wRfx: NONREACTIVE

## 2021-09-14 LAB — CBC WITH DIFFERENTIAL (CANCER CENTER ONLY)
Abs Immature Granulocytes: 0 10*3/uL (ref 0.00–0.07)
Basophils Absolute: 0 10*3/uL (ref 0.0–0.1)
Basophils Relative: 0 %
Eosinophils Absolute: 0 10*3/uL (ref 0.0–0.5)
Eosinophils Relative: 1 %
HCT: 37.7 % (ref 36.0–46.0)
Hemoglobin: 11.2 g/dL — ABNORMAL LOW (ref 12.0–15.0)
Immature Granulocytes: 0 %
Lymphocytes Relative: 28 %
Lymphs Abs: 0.9 10*3/uL (ref 0.7–4.0)
MCH: 20.9 pg — ABNORMAL LOW (ref 26.0–34.0)
MCHC: 29.7 g/dL — ABNORMAL LOW (ref 30.0–36.0)
MCV: 70.3 fL — ABNORMAL LOW (ref 80.0–100.0)
Monocytes Absolute: 0.3 10*3/uL (ref 0.1–1.0)
Monocytes Relative: 10 %
Neutro Abs: 1.8 10*3/uL (ref 1.7–7.7)
Neutrophils Relative %: 61 %
Platelet Count: 291 10*3/uL (ref 150–400)
RBC: 5.36 MIL/uL — ABNORMAL HIGH (ref 3.87–5.11)
RDW: 17.4 % — ABNORMAL HIGH (ref 11.5–15.5)
WBC Count: 3 10*3/uL — ABNORMAL LOW (ref 4.0–10.5)
nRBC: 0 % (ref 0.0–0.2)

## 2021-09-14 LAB — HEPATITIS PANEL, ACUTE
HCV Ab: NONREACTIVE
Hep A IgM: NONREACTIVE
Hep B C IgM: NONREACTIVE
Hepatitis B Surface Ag: NONREACTIVE

## 2021-09-14 LAB — CMP (CANCER CENTER ONLY)
ALT: 14 U/L (ref 0–44)
AST: 15 U/L (ref 15–41)
Albumin: 3.9 g/dL (ref 3.5–5.0)
Alkaline Phosphatase: 41 U/L (ref 38–126)
Anion gap: 5 (ref 5–15)
BUN: 14 mg/dL (ref 8–23)
CO2: 28 mmol/L (ref 22–32)
Calcium: 9.1 mg/dL (ref 8.9–10.3)
Chloride: 105 mmol/L (ref 98–111)
Creatinine: 0.78 mg/dL (ref 0.44–1.00)
GFR, Estimated: >60 mL/min (ref >60)
Glucose, Bld: 105 mg/dL — ABNORMAL HIGH (ref 70–99)
Potassium: 3.8 mmol/L (ref 3.5–5.1)
Sodium: 138 mmol/L (ref 135–145)
Total Bilirubin: 0.6 mg/dL (ref 0.3–1.2)
Total Protein: 6.6 g/dL (ref 6.5–8.1)

## 2021-09-14 LAB — IRON AND IRON BINDING CAPACITY (CC-WL,HP ONLY)
Iron: 32 ug/dL (ref 28–170)
Saturation Ratios: 9 % — ABNORMAL LOW (ref 10.4–31.8)
TIBC: 370 ug/dL (ref 250–450)
UIBC: 338 ug/dL (ref 148–442)

## 2021-09-14 LAB — VITAMIN B12: Vitamin B-12: 229 pg/mL (ref 180–914)

## 2021-09-14 LAB — LACTATE DEHYDROGENASE: LDH: 105 U/L (ref 98–192)

## 2021-09-14 LAB — FERRITIN: Ferritin: 20 ng/mL (ref 11–307)

## 2021-09-14 NOTE — Telephone Encounter (Signed)
I called the pt to ask why was she refusing the AWV and that it must be done for medicare and she can do it remotely, if not she can see Celesta Gentile, DNP, FNP-BC for the AWV.

## 2021-09-14 NOTE — Progress Notes (Signed)
[place] [phone]   [phone]  CONSULT NOTE  REFERRING PHYSICIAN: Dr. Glendale Chard  REASON FOR CONSULTATION:  65 years old African-American female with  HPI Felicia Garrett is a 65 y.o. female with past medical history significant for diabetes mellitus, dyslipidemia, osteoarthritis, anemia, uterine fibroid embolization as well as laparoscopic gastric banding.  The patient mentioned that she has a history of anemia for several years.  She was evaluated many years ago by hematology at the cancer center likely with Dr. Jamse Arn.  She had hemoglobin electrophoresis by her primary care physician at some point in the past and that showed some abnormality but the patient does not remember which abnormality it is.  She always have microcytic anemia but her iron studies were unremarkable.  She was seen recently by her primary care physician and noted to have persistent leukocytopenia.  She was referred for further evaluation and recommendation regarding her condition.  Reviewing her previous records indicate that the patient had leukocytopenia for several years.  She has no symptoms with her low white blood count.  She has no recurrent infection.  She has no clear rheumatologic disorders. When seen today the patient has no complaints.  She denied having any current chest pain, shortness of breath, cough or hemoptysis.  She denied having any fever or chills.  She has no nausea, vomiting, diarrhea but has constipation.  She has no headache or visual changes.  She has no weight loss or night sweats. Family history significant for mother with hypertension and dyslipidemia as well as history of DVT.  Father has diabetes mellitus.  She had several family members with anemia. The patient is single and has no children.  She is currently retired and used to work as a Designer, jewellery at [employer].  She has no history for smoking, alcohol or drug abuse.  HPI  Past Medical  History:  Diagnosis Date   Anemia    Arthritis    Depression    Diabetes mellitus    Hyperlipidemia    Thyroid nodule     Past Surgical History:  Procedure Laterality Date   LAPAROSCOPIC GASTRIC BANDING  06/14/08   THYROIDECTOMY N/A 10/08/2014   Procedure: NEAR TOTAL THYROIDECTOMY;  Surgeon: Pedro Earls, MD;  Location: [facility];  Service: General;  Laterality: N/A;   UTERINE FIBROID EMBOLIZATION  10 yrs ago    Family History  Problem Relation Age of Onset   Hypertension Mother    Hyperlipidemia Mother    Hyperthyroidism Mother    Diabetes Father     Social History Social History   Tobacco Use   Smoking status: Never   Smokeless tobacco: Never  Vaping Use   Vaping Use: Never used  Substance Use Topics   Alcohol use: No   Drug use: No    Allergies  Allergen Reactions   Penicillins Shortness Of Breath    CHEST PAIN    Current Outpatient Medications  Medication Sig Dispense Refill   buPROPion (WELLBUTRIN XL) 150 MG 24 hr tablet TAKE 1 TABLET BY MOUTH EVERY DAY 90 tablet 2   Dapagliflozin-metFORMIN HCl ER (XIGDUO XR) 01-999 MG TB24 Take 2 tabs po qam 180 tablet 2   Iron-FA-B Cmp-C-Biot-Probiotic (FUSION PLUS) CAPS TAKE 1 CAPSULE BY ORAL ROUTE EVERY DAY BETWEEN MEALS 30 capsule 1   levothyroxine (SYNTHROID) 137 MCG tablet TAKE 1 TABLET (137 MCG TOTAL) BY MOUTH DAILY BEFORE BREAKFAST. MONDAY - FRIDAY (Patient taking differently: Take 137 mcg by mouth  daily before breakfast. Monday - Saturday) 80 tablet 2   losartan (COZAAR) 50 MG tablet Take 1 tablet (50 mg total) by mouth daily. 90 tablet 1   methocarbamol (ROBAXIN-750) 750 MG tablet Take 1 tablet (750 mg total) by mouth 3 (three) times daily. 40 tablet 1   nystatin cream (MYCOSTATIN) APPLY TO AFFECTED AREA TWICE A DAY     simvastatin (ZOCOR) 80 MG tablet Take 1 tablet (80 mg total) by mouth every evening. 90 tablet 2   tirzepatide (MOUNJARO) 5 MG/0.5ML Pen Inject 5 mg into the skin once a week. 0.5 mL 1   No  current facility-administered medications for this visit.    Review of Systems  Constitutional: negative Eyes: negative Ears, nose, mouth, throat, and face: negative Respiratory: negative Cardiovascular: negative Gastrointestinal: negative Genitourinary:negative Integument/breast: negative Hematologic/lymphatic: negative Musculoskeletal:negative Neurological: negative Behavioral/Psych: negative Endocrine: negative Allergic/Immunologic: negative  Physical Exam  XYI:AXKPV, healthy, no distress, well nourished, and well developed SKIN: skin color, texture, turgor are normal, no rashes or significant lesions HEAD: Normocephalic, No masses, lesions, tenderness or abnormalities EYES: normal, PERRLA, Conjunctiva are pink and non-injected EARS: External ears normal, Canals clear OROPHARYNX:no exudate, no erythema, and lips, buccal mucosa, and tongue normal  NECK: supple, no adenopathy, no JVD LYMPH:  no palpable lymphadenopathy, no hepatosplenomegaly BREAST:not examined LUNGS: clear to auscultation , and palpation HEART: regular rate & rhythm, no murmurs, and no gallops ABDOMEN:abdomen soft, non-tender, normal bowel sounds, and no masses or organomegaly BACK: Back symmetric, no curvature., No CVA tenderness EXTREMITIES:no joint deformities, effusion, or inflammation, no edema  NEURO: alert & oriented x 3 with fluent speech, no focal motor/sensory deficits  PERFORMANCE STATUS: ECOG 0  LABORATORY DATA: Lab Results  Component Value Date   WBC 3.0 (L) 09/14/2021   HGB 11.2 (L) 09/14/2021   HCT 37.7 09/14/2021   MCV 70.3 (L) 09/14/2021   PLT 291 09/14/2021      Chemistry      Component Value Date/Time   NA 138 09/14/2021 1059   NA 140 08/23/2021 1519   K 3.8 09/14/2021 1059   CL 105 09/14/2021 1059   CO2 28 09/14/2021 1059   BUN 14 09/14/2021 1059   BUN 13 08/23/2021 1519   CREATININE 0.78 09/14/2021 1059      Component Value Date/Time   CALCIUM 9.1 09/14/2021 1059    ALKPHOS 41 09/14/2021 1059   AST 15 09/14/2021 1059   ALT 14 09/14/2021 1059   BILITOT 0.6 09/14/2021 1059       RADIOGRAPHIC STUDIES: No results found.  ASSESSMENT: This is a very pleasant 65 years old African-American female presented for evaluation of leukocytopenia and mild anemia.  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   PLAN: I had a lengthy discussion with the patient today about her current condition and further investigation to understand the underlying cause of her leukocytopenia and anemia. I recommended for her to get a copy from her primary care physician regarding the hemoglobin electrophoresis that was done several years ago.  I also recommend for the patient to continue on the oral iron supplements. I order several studies to evaluate leukocytopenia including serum folate, vitamin B12, LDH, iron study and ferritin as well as acute hepatitis panel, HIV, rheumatoid factor as well as ANA. I recommended for the patient to continue on observation for now with repeat CBC and LDH in 3 months.  If she continues to have more decline in her total white blood count, we may consider her for a bone marrow biopsy and aspirate to rule out any underlying bone marrow abnormality. The patient is in agreement with the current plan. She was advised to call immediately if she has any other concerning symptoms in the interval.  The patient voices understanding of current disease status and treatment options and is in agreement with the current care plan.  All questions were answered. The patient knows to call the clinic with any problems, questions or concerns. We can certainly see the patient much sooner if necessary.  Thank you so much for allowing me to participate in the care of [place]. I will continue to follow up the patient with you and  assist in her care.  The total time spent in the appointment was 60 minutes.  Disclaimer: This note was dictated with voice recognition software. Similar sounding words can inadvertently be transcribed and may not be corrected upon review.   Eilleen Kempf September 14, 2021, 12:13 PM

## 2021-09-15 LAB — RHEUMATOID FACTOR: Rheumatoid fact SerPl-aCnc: <10 IU/mL (ref <14.0)

## 2021-09-16 ENCOUNTER — Other Ambulatory Visit: Payer: Self-pay | Admitting: Internal Medicine

## 2021-09-19 LAB — ANTINUCLEAR ANTIBODIES, IFA: ANA Ab, IFA: NEGATIVE

## 2021-09-27 ENCOUNTER — Other Ambulatory Visit: Payer: Self-pay

## 2021-09-27 ENCOUNTER — Other Ambulatory Visit: Payer: Medicare PPO

## 2021-10-26 ENCOUNTER — Ambulatory Visit: Payer: Medicare PPO | Admitting: Internal Medicine

## 2021-10-26 ENCOUNTER — Other Ambulatory Visit: Payer: Self-pay

## 2021-10-26 ENCOUNTER — Encounter: Payer: Self-pay | Admitting: Internal Medicine

## 2021-10-26 VITALS — BP 110/70 | HR 68 | Temp 98.1°F | Ht 68.8 in | Wt 202.4 lb

## 2021-10-26 DIAGNOSIS — E6609 Other obesity due to excess calories: Secondary | ICD-10-CM

## 2021-10-26 DIAGNOSIS — Z6830 Body mass index (BMI) 30.0-30.9, adult: Secondary | ICD-10-CM | POA: Diagnosis not present

## 2021-10-26 DIAGNOSIS — E041 Nontoxic single thyroid nodule: Secondary | ICD-10-CM

## 2021-10-26 DIAGNOSIS — E1169 Type 2 diabetes mellitus with other specified complication: Secondary | ICD-10-CM | POA: Diagnosis not present

## 2021-10-26 DIAGNOSIS — S61012A Laceration without foreign body of left thumb without damage to nail, initial encounter: Secondary | ICD-10-CM

## 2021-10-26 DIAGNOSIS — E785 Hyperlipidemia, unspecified: Secondary | ICD-10-CM

## 2021-10-26 DIAGNOSIS — E890 Postprocedural hypothyroidism: Secondary | ICD-10-CM | POA: Diagnosis not present

## 2021-10-26 DIAGNOSIS — I10 Essential (primary) hypertension: Secondary | ICD-10-CM

## 2021-10-26 MED ORDER — DOXYCYCLINE HYCLATE 100 MG PO TABS: 100.0000 mg | ORAL_TABLET | Freq: Two times a day (BID) | ORAL | 0 refills | Status: DC

## 2021-10-26 MED ORDER — MOUNJARO 10 MG/0.5ML ~~LOC~~ SOAJ: 10.0000 mg | SUBCUTANEOUS | 0 refills | Status: DC

## 2021-10-26 NOTE — Patient Instructions (Signed)

## 2021-10-26 NOTE — Progress Notes (Signed)
Rich Brave [place],acting as a Education administrator for Maximino Greenland, MD.,have documented all relevant documentation on the behalf of Maximino Greenland, MD,as directed by  Maximino Greenland, MD while in the presence of Maximino Greenland, MD.  This visit occurred during the SARS-CoV-2 public health emergency.  Safety protocols were in place, including screening questions prior to the visit, additional usage of staff PPE, and extensive cleaning of exam room while observing appropriate contact time as indicated for disinfecting solutions.  Subjective:     Patient ID: Felicia Garrett , female    DOB: May 01, 1956 , 66 y.o.   MRN: [id number]   Chief Complaint  Patient presents with   Diabetes   Hypertension   Weight Check    HPI  Patient presents today for f/u diabetes. At her last visit, she was switched from [place] to [place]. She has not had any issues with the medication. She would like to increase to 10mg  weekly. She has not had any issues with 2.5mg  or 5mg  dose. She feels that going to this dose is okay since she is not GLP-1 agonist naive.  She reports compliance with her medications.   Diabetes She presents for her follow-up diabetic visit. She has type 2 diabetes mellitus. Her disease course has been stable. There are no hypoglycemic associated symptoms. There are no hypoglycemic complications. Risk factors for coronary artery disease include diabetes mellitus, dyslipidemia, hypertension, obesity and post-menopausal. She participates in exercise every other day. Eye exam is current.    Past Medical History:  Diagnosis Date   Anemia    Arthritis    Depression    Diabetes mellitus    Hyperlipidemia    Thyroid nodule      Family History  Problem Relation Age of Onset   Hypertension Mother    Hyperlipidemia Mother    Hyperthyroidism Mother    Diabetes Father      Current Outpatient Medications:    buPROPion (WELLBUTRIN XL) 150 MG 24 hr tablet, TAKE 1 TABLET BY MOUTH EVERY DAY, Disp: 90  tablet, Rfl: 2   Dapagliflozin-metFORMIN HCl ER (XIGDUO XR) 01-999 MG TB24, Take 2 tabs po qam, Disp: 180 tablet, Rfl: 2   doxycycline (VIBRA-TABS) 100 MG tablet, Take 1 tablet (100 mg total) by mouth 2 (two) times daily., Disp: 14 tablet, Rfl: 0   Iron-FA-B Cmp-C-Biot-Probiotic (FUSION PLUS) CAPS, TAKE 1 CAPSULE BY ORAL ROUTE EVERY DAY BETWEEN MEALS, Disp: 30 capsule, Rfl: 1   levothyroxine (SYNTHROID) 137 MCG tablet, TAKE 1 TABLET (137 MCG TOTAL) BY MOUTH DAILY BEFORE BREAKFAST. MONDAY - FRIDAY, Disp: 80 tablet, Rfl: 2   losartan (COZAAR) 50 MG tablet, Take 1 tablet (50 mg total) by mouth daily., Disp: 90 tablet, Rfl: 1   methocarbamol (ROBAXIN-750) 750 MG tablet, Take 1 tablet (750 mg total) by mouth 3 (three) times daily., Disp: 40 tablet, Rfl: 1   nystatin cream (MYCOSTATIN), APPLY TO AFFECTED AREA TWICE A DAY, Disp: , Rfl:    simvastatin (ZOCOR) 80 MG tablet, Take 1 tablet (80 mg total) by mouth every evening., Disp: 90 tablet, Rfl: 2   tirzepatide (MOUNJARO) 10 MG/0.5ML Pen, Inject 10 mg into the skin once a week., Disp: 0.5 mL, Rfl: 0   benzonatate (TESSALON PERLES) 100 MG capsule, Take 1 capsule (100 mg total) by mouth 3 (three) times daily as needed for cough., Disp: 30 capsule, Rfl: 1   HYDROcodone bit-homatropine (HYDROMET) 5-1.5 MG/5ML syrup, Take 5 mLs by mouth every 6 (six) hours as needed for cough.,  Disp: 120 mL, Rfl: 0   molnupiravir EUA (LAGEVRIO) 200 mg CAPS capsule, Take 4 capsules (800 mg total) by mouth 2 (two) times daily for 5 days., Disp: 40 capsule, Rfl: 0   Allergies  Allergen Reactions   Penicillins Shortness Of Breath    CHEST PAIN     Review of Systems  Constitutional: Negative.   Respiratory: Negative.    Cardiovascular: Negative.   Gastrointestinal: Negative.   Skin:        She has laceration of her left thumb. States she cut it on a chainsaw. Would like to have abx. She states there is some drainage from the wound.   Neurological: Negative.    Psychiatric/Behavioral: Negative.      Today's Vitals   10/26/21 1139  BP: 110/70  Pulse: 68  Temp: 98.1 F (36.7 C)  Weight: 202 lb 6.4 oz (91.8 kg)  Height: 5' 8.8" (1.748 m)  PainSc: 0-No pain   Body mass index is 30.06 kg/m.  Wt Readings from Last 3 Encounters:  10/26/21 202 lb 6.4 oz (91.8 kg)  09/14/21 196 lb 1 oz (88.9 kg)  08/23/21 190 lb 3.2 oz (86.3 kg)     Objective:  Physical Exam Vitals and nursing note reviewed.  Constitutional:      Appearance: Normal appearance.  HENT:     Head: Normocephalic and atraumatic.     Nose:     Comments: MASKED     Mouth/Throat:     Comments: MASKED  Eyes:     Extraocular Movements: Extraocular movements intact.  Cardiovascular:     Rate and Rhythm: Normal rate and regular rhythm.     Heart sounds: Normal heart sounds.  Pulmonary:     Effort: Pulmonary effort is normal.     Breath sounds: Normal breath sounds.  Musculoskeletal:     Cervical back: Normal range of motion.  Skin:    General: Skin is warm.     Comments: LACERATION LEFT THUMB W/ SEROSANGUINOUS DRAINAGE, SL TENDER NO SURROUNDING ERYTHEMA  Neurological:     General: No focal deficit present.     Mental Status: She is alert.  Psychiatric:        Mood and Affect: Mood normal.        Behavior: Behavior normal.     Assessment And Plan:     1. Type 2 diabetes mellitus with hyperlipidemia ([place]) Comments: She would like to increase to [facility] 10mg  weekly. She will f/u in 8-12 wks for DM check. She will c/w simvastatin 80mg  qd.   2. 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 I will check thyroid panel and adjust meds as needed.  - TSH + free T4  3. Laceration of left thumb without foreign body without damage to nail, initial encounter Comments: Occurred on Sunday, Feb . I will send rx doxycycline, she is PCN allergic.   4. Class 1 obesity due to excess calories with serious comorbidity and body mass index (BMI) of 30.0  to 30.9 in adult Comments: She is encouraged to aim for at least 150 minutes of exercise per week.    Patient was given opportunity to ask questions. Patient verbalized understanding of the plan and was able to repeat key elements of the plan. All questions were answered to their satisfaction.   I, Maximino Greenland, MD, have reviewed all documentation for this visit. The documentation on 10/31/21 for the exam, diagnosis, procedures, and orders are all accurate and complete.   IF  YOU HAVE BEEN REFERRED TO A SPECIALIST, IT MAY TAKE 1-2 WEEKS TO SCHEDULE/PROCESS THE REFERRAL. IF YOU HAVE NOT HEARD FROM US/SPECIALIST IN TWO WEEKS, PLEASE GIVE [place] A CALL AT [phone] X [phone].   THE PATIENT IS ENCOURAGED TO PRACTICE SOCIAL DISTANCING DUE TO THE COVID-19 PANDEMIC.

## 2021-10-27 ENCOUNTER — Encounter: Payer: Self-pay | Admitting: Internal Medicine

## 2021-10-27 LAB — TSH+FREE T4
Free T4: 1.56 ng/dL (ref 0.82–1.77)
TSH: 6.53 u[IU]/mL — ABNORMAL HIGH (ref 0.450–4.500)

## 2021-10-31 ENCOUNTER — Other Ambulatory Visit: Payer: Self-pay

## 2021-10-31 ENCOUNTER — Encounter: Payer: Self-pay | Admitting: Internal Medicine

## 2021-10-31 ENCOUNTER — Telehealth (INDEPENDENT_AMBULATORY_CARE_PROVIDER_SITE_OTHER): Payer: Medicare PPO | Admitting: Internal Medicine

## 2021-10-31 DIAGNOSIS — U071 COVID-19: Secondary | ICD-10-CM | POA: Diagnosis not present

## 2021-10-31 MED ORDER — HYDROCODONE BIT-HOMATROP MBR 5-1.5 MG/5ML PO SOLN: 5.0000 mL | Freq: Four times a day (QID) | ORAL | 0 refills | Status: DC | PRN

## 2021-10-31 MED ORDER — MOLNUPIRAVIR EUA 200MG CAPSULE: 4.0000 | ORAL_CAPSULE | Freq: Two times a day (BID) | ORAL | 0 refills | Status: AC

## 2021-10-31 MED ORDER — BENZONATATE 100 MG PO CAPS: 100.0000 mg | ORAL_CAPSULE | Freq: Three times a day (TID) | ORAL | 1 refills | Status: DC | PRN

## 2021-10-31 NOTE — Patient Instructions (Signed)
COVID-19: Quarantine and Isolation °Quarantine °If you were exposed °Quarantine and stay away from others when you have been in close contact with someone who has COVID-19. °Isolate °If you are sick or test positive °Isolate when you are sick or when you have COVID-19, even if you don't have symptoms. °When to stay home °Calculating quarantine °The date of your exposure is considered day 0. Day 1 is the first full day after your last contact with a person who has had COVID-19. Stay home and away from other people for at least 5 days. Learn why CDC updated guidance for the general public. °IF YOU were exposed to COVID-19 and are NOT  °up to dateIF YOU were exposed to COVID-19 and are NOT on COVID-19 vaccinations °Quarantine for at least 5 days °Stay home °Stay home and quarantine for at least 5 full days. °Wear a well-fitting mask if you must be around others in your home. °Do not travel. °Get tested °Even if you don't develop symptoms, get tested at least 5 days after you last had close contact with someone with COVID-19. °After quarantine °Watch for symptoms °Watch for symptoms until 10 days after you last had close contact with someone with COVID-19. °Avoid travel °It is best to avoid travel until a full 10 days after you last had close contact with someone with COVID-19. °If you develop symptoms °Isolate immediately and get tested. Continue to stay home until you know the results. Wear a well-fitting mask around others. °Take precautions until day 10 °Wear a well-fitting mask °Wear a well-fitting mask for 10 full days any time you are around others inside your home or in public. Do not go to places where you are unable to wear a well-fitting mask. °If you must travel during days 6-10, take precautions. °Avoid being around people who are more likely to get very sick from COVID-19. °IF YOU were exposed to COVID-19 and are  °up to dateIF YOU were exposed to COVID-19 and are on COVID-19 vaccinations °No  quarantine °You do not need to stay home unless you develop symptoms. °Get tested °Even if you don't develop symptoms, get tested at least 5 days after you last had close contact with someone with COVID-19. °Watch for symptoms °Watch for symptoms until 10 days after you last had close contact with someone with COVID-19. °If you develop symptoms °Isolate immediately and get tested. Continue to stay home until you know the results. Wear a well-fitting mask around others. °Take precautions until day 10 °Wear a well-fitting mask °Wear a well-fitting mask for 10 full days any time you are around others inside your home or in public. Do not go to places where you are unable to wear a well-fitting mask. °Take precautions if traveling °Avoid being around people who are more likely to get very sick from COVID-19. °IF YOU were exposed to COVID-19 and had confirmed COVID-19 within the past 90 days (you tested positive using a viral test) °No quarantine °You do not need to stay home unless you develop symptoms. °Watch for symptoms °Watch for symptoms until 10 days after you last had close contact with someone with COVID-19. °If you develop symptoms °Isolate immediately and get tested. Continue to stay home until you know the results. Wear a well-fitting mask around others. °Take precautions until day 10 °Wear a well-fitting mask °Wear a well-fitting mask for 10 full days any time you are around others inside your home or in public. Do not go to places where you are   unable to wear a well-fitting mask. °Take precautions if traveling °Avoid being around people who are more likely to get very sick from COVID-19. °Calculating isolation °Day 0 is your first day of symptoms or a positive viral test. Day 1 is the first full day after your symptoms developed or your test specimen was collected. If you have COVID-19 or have symptoms, isolate for at least 5 days. °IF YOU tested positive for COVID-19 or have symptoms, regardless of  vaccination status °Stay home for at least 5 days °Stay home for 5 days and isolate from others in your home. °Wear a well-fitting mask if you must be around others in your home. °Do not travel. °Ending isolation if you had symptoms °End isolation after 5 full days if you are fever-free for 24 hours (without the use of fever-reducing medication) and your symptoms are improving. °Ending isolation if you did NOT have symptoms °End isolation after at least 5 full days after your positive test. °If you got very sick from COVID-19 or have a weakened immune system °You should isolate for at least 10 days. Consult your doctor before ending isolation. °Take precautions until day 10 °Wear a well-fitting mask °Wear a well-fitting mask for 10 full days any time you are around others inside your home or in public. Do not go to places where you are unable to wear a well-fitting mask. °Do not travel °Do not travel until a full 10 days after your symptoms started or the date your positive test was taken if you had no symptoms. °Avoid being around people who are more likely to get very sick from COVID-19. °Definitions °Exposure °Contact with someone infected with SARS-CoV-2, the virus that causes COVID-19, in a way that increases the likelihood of getting infected with the virus. °Close contact °A close contact is someone who was less than 6 feet away from an infected person (laboratory-confirmed or a clinical diagnosis) for a cumulative total of 15 minutes or more over a 24-hour period. For example, three individual 5-minute exposures for a total of 15 minutes. People who are exposed to someone with COVID-19 after they completed at least 5 days of isolation are not considered close contacts. °Quarantine °Quarantine is a strategy used to prevent transmission of COVID-19 by keeping people who have been in close contact with someone with COVID-19 apart from others. °Who does not need to quarantine? °If you had close contact with  someone with COVID-19 and you are in one of the following groups, you do not need to quarantine. °You are up to date with your COVID-19 vaccines. °You had confirmed COVID-19 within the last 90 days (meaning you tested positive using a viral test). °If you are up to date with COVID-19 vaccines, you should wear a well-fitting mask around others for 10 days from the date of your last close contact with someone with COVID-19 (the date of last close contact is considered day 0). Get tested at least 5 days after you last had close contact with someone with COVID-19. If you test positive or develop COVID-19 symptoms, isolate from other people and follow recommendations in the Isolation section below. If you tested positive for COVID-19 with a viral test within the previous 90 days and subsequently recovered and remain without COVID-19 symptoms, you do not need to quarantine or get tested after close contact. You should wear a well-fitting mask around others for 10 days from the date of your last close contact with someone with COVID-19 (the date of last   close contact is considered day 0). If you have COVID-19 symptoms, get tested and isolate from other people and follow recommendations in the Isolation section below. °Who should quarantine? °If you come into close contact with someone with COVID-19, you should quarantine if you are not up to date on COVID-19 vaccines. This includes people who are not vaccinated. °What to do for quarantine °Stay home and away from other people for at least 5 days (day 0 through day 5) after your last contact with a person who has COVID-19. The date of your exposure is considered day 0. Wear a well-fitting mask when around others at home, if possible. °For 10 days after your last close contact with someone with COVID-19, watch for fever (100.4°F or greater), cough, shortness of breath, or other COVID-19 symptoms. °If you develop symptoms, get tested immediately and isolate until you receive  your test results. If you test positive, follow isolation recommendations. °If you do not develop symptoms, get tested at least 5 days after you last had close contact with someone with COVID-19. °If you test negative, you can leave your home, but continue to wear a well-fitting mask when around others at home and in public until 10 days after your last close contact with someone with COVID-19. °If you test positive, you should isolate for at least 5 days from the date of your positive test (if you do not have symptoms). If you do develop COVID-19 symptoms, isolate for at least 5 days from the date your symptoms began (the date the symptoms started is day 0). Follow recommendations in the isolation section below. °If you are unable to get a test 5 days after last close contact with someone with COVID-19, you can leave your home after day 5 if you have been without COVID-19 symptoms throughout the 5-day period. Wear a well-fitting mask for 10 days after your date of last close contact when around others at home and in public. °Avoid people who are have weakened immune systems or are more likely to get very sick from COVID-19, and nursing homes and other high-risk settings, until after at least 10 days. °If possible, stay away from people you live with, especially people who are at higher risk for getting very sick from COVID-19, as well as others outside your home throughout the full 10 days after your last close contact with someone with COVID-19. °If you are unable to quarantine, you should wear a well-fitting mask for 10 days when around others at home and in public. °If you are unable to wear a mask when around others, you should continue to quarantine for 10 days. Avoid people who have weakened immune systems or are more likely to get very sick from COVID-19, and nursing homes and other high-risk settings, until after at least 10 days. °See additional information about travel. °Do not go to places where you are  unable to wear a mask, such as restaurants and some gyms, and avoid eating around others at home and at work until after 10 days after your last close contact with someone with COVID-19. °After quarantine °Watch for symptoms until 10 days after your last close contact with someone with COVID-19. °If you have symptoms, isolate immediately and get tested. °Quarantine in high-risk congregate settings °In certain congregate settings that have high risk of secondary transmission (such as correctional and detention facilities, homeless shelters, or cruise ships), CDC recommends a 10-day quarantine for residents, regardless of vaccination and booster status. During periods of critical staffing   shortages, facilities may consider shortening the quarantine period for staff to ensure continuity of operations. Decisions to shorten quarantine in these settings should be made in consultation with state, local, tribal, or territorial health departments and should take into consideration the context and characteristics of the facility. CDC's setting-specific guidance provides additional recommendations for these settings. °Isolation °Isolation is used to separate people with confirmed or suspected COVID-19 from those without COVID-19. People who are in isolation should stay home until it's safe for them to be around others. At home, anyone sick or infected should separate from others, or wear a well-fitting mask when they need to be around others. People in isolation should stay in a specific "sick room" or area and use a separate bathroom if available. Everyone who has presumed or confirmed COVID-19 should stay home and isolate from other people for at least 5 full days (day 0 is the first day of symptoms or the date of the day of the positive viral test for asymptomatic persons). They should wear a mask when around others at home and in public for an additional 5 days. People who are confirmed to have COVID-19 or are showing  symptoms of COVID-19 need to isolate regardless of their vaccination status. This includes: °People who have a positive viral test for COVID-19, regardless of whether or not they have symptoms. °People with symptoms of COVID-19, including people who are awaiting test results or have not been tested. People with symptoms should isolate even if they do not know if they have been in close contact with someone with COVID-19. °What to do for isolation °Monitor your symptoms. If you have an emergency warning sign (including trouble breathing), seek emergency medical care immediately. °Stay in a separate room from other household members, if possible. °Use a separate bathroom, if possible. °Take steps to improve ventilation at home, if possible. °Avoid contact with other members of the household and pets. °Don't share personal household items, like cups, towels, and utensils. °Wear a well-fitting mask when you need to be around other people. °Learn more about what to do if you are sick and how to notify your contacts. °Ending isolation for people who had COVID-19 and had symptoms °If you had COVID-19 and had symptoms, isolate for at least 5 days. To calculate your 5-day isolation period, day 0 is your first day of symptoms. Day 1 is the first full day after your symptoms developed. You can leave isolation after 5 full days. °You can end isolation after 5 full days if you are fever-free for 24 hours without the use of fever-reducing medication and your other symptoms have improved (Loss of taste and smell may persist for weeks or months after recovery and need not delay the end of isolation). °You should continue to wear a well-fitting mask around others at home and in public for 5 additional days (day 6 through day 10) after the end of your 5-day isolation period. If you are unable to wear a mask when around others, you should continue to isolate for a full 10 days. Avoid people who have weakened immune systems or are more  likely to get very sick from COVID-19, and nursing homes and other high-risk settings, until after at least 10 days. °If you continue to have fever or your other symptoms have not improved after 5 days of isolation, you should wait to end your isolation until you are fever-free for 24 hours without the use of fever-reducing medication and your other symptoms have improved.   Continue to wear a well-fitting mask through day 10. Contact your healthcare provider if you have questions. °See additional information about travel. °Do not go to places where you are unable to wear a mask, such as restaurants and some gyms, and avoid eating around others at home and at work until a full 10 days after your first day of symptoms. °If an individual has access to a test and wants to test, the best approach is to use an antigen test1 towards the end of the 5-day isolation period. Collect the test sample only if you are fever-free for 24 hours without the use of fever-reducing medication and your other symptoms have improved (loss of taste and smell may persist for weeks or months after recovery and need not delay the end of isolation). If your test result is positive, you should continue to isolate until day 10. If your test result is negative, you can end isolation, but continue to wear a well-fitting mask around others at home and in public until day 10. Follow additional recommendations for masking and avoiding travel as described above. °1As noted in the labeling for authorized over-the counter antigen tests: Negative results should be treated as presumptive. Negative results do not rule out SARS-CoV-2 infection and should not be used as the sole basis for treatment or patient management decisions, including infection control decisions. To improve results, antigen tests should be used twice over a three-day period with at least 24 hours and no more than 48 hours between tests. °Note that these recommendations on ending isolation  do not apply to people who are moderately ill or very sick from COVID-19 or have weakened immune systems. See section below for recommendations for when to end isolation for these groups. °Ending isolation for people who tested positive for COVID-19 but had no symptoms °If you test positive for COVID-19 and never develop symptoms, isolate for at least 5 days. Day 0 is the day of your positive viral test (based on the date you were tested) and day 1 is the first full day after the specimen was collected for your positive test. You can leave isolation after 5 full days. °If you continue to have no symptoms, you can end isolation after at least 5 days. °You should continue to wear a well-fitting mask around others at home and in public until day 10 (day 6 through day 10). If you are unable to wear a mask when around others, you should continue to isolate for 10 days. Avoid people who have weakened immune systems or are more likely to get very sick from COVID-19, and nursing homes and other high-risk settings, until after at least 10 days. °If you develop symptoms after testing positive, your 5-day isolation period should start over. Day 0 is your first day of symptoms. Follow the recommendations above for ending isolation for people who had COVID-19 and had symptoms. °See additional information about travel. °Do not go to places where you are unable to wear a mask, such as restaurants and some gyms, and avoid eating around others at home and at work until 10 days after the day of your positive test. °If an individual has access to a test and wants to test, the best approach is to use an antigen test1 towards the end of the 5-day isolation period. If your test result is positive, you should continue to isolate until day 10. If your test result is positive, you can also choose to test daily and if your test result   is negative, you can end isolation, but continue to wear a well-fitting mask around others at home and in  public until day 10. Follow additional recommendations for masking and avoiding travel as described above. °1As noted in the labeling for authorized over-the counter antigen tests: Negative results should be treated as presumptive. Negative results do not rule out SARS-CoV-2 infection and should not be used as the sole basis for treatment or patient management decisions, including infection control decisions. To improve results, antigen tests should be used twice over a three-day period with at least 24 hours and no more than 48 hours between tests. °Ending isolation for people who were moderately or very sick from COVID-19 or have a weakened immune system °People who are moderately ill from COVID-19 (experiencing symptoms that affect the lungs like shortness of breath or difficulty breathing) should isolate for 10 days and follow all other isolation precautions. To calculate your 10-day isolation period, day 0 is your first day of symptoms. Day 1 is the first full day after your symptoms developed. If you are unsure if your symptoms are moderate, talk to a healthcare provider for further guidance. °People who are very sick from COVID-19 (this means people who were hospitalized or required intensive care or ventilation support) and people who have weakened immune systems might need to isolate at home longer. They may also require testing with a viral test to determine when they can be around others. CDC recommends an isolation period of at least 10 and up to 20 days for people who were very sick from COVID-19 and for people with weakened immune systems. Consult with your healthcare provider about when you can resume being around other people. If you are unsure if your symptoms are severe or if you have a weakened immune system, talk to a healthcare provider for further guidance. °People who have a weakened immune system should talk to their healthcare provider about the potential for reduced immune responses to  COVID-19 vaccines and the need to continue to follow current prevention measures (including wearing a well-fitting mask and avoiding crowds and poorly ventilated indoor spaces) to protect themselves against COVID-19 until advised otherwise by their healthcare provider. Close contacts of immunocompromised people--including household members--should also be encouraged to receive all recommended COVID-19 vaccine doses to help protect these people. °Isolation in high-risk congregate settings °In certain high-risk congregate settings that have high risk of secondary transmission and where it is not feasible to cohort people (such as correctional and detention facilities, homeless shelters, and cruise ships), CDC recommends a 10-day isolation period for residents. During periods of critical staffing shortages, facilities may consider shortening the isolation period for staff to ensure continuity of operations. Decisions to shorten isolation in these settings should be made in consultation with state, local, tribal, or territorial health departments and should take into consideration the context and characteristics of the facility. CDC's setting-specific guidance provides additional recommendations for these settings. °This CDC guidance is meant to supplement--not replace--any federal, state, local, territorial, or tribal health and safety laws, rules, and regulations. °Recommendations for specific settings °These recommendations do not apply to healthcare professionals. For guidance specific to these settings, see °Healthcare professionals: Interim Guidance for Managing Healthcare Personnel with SARS-CoV-2 Infection or Exposure to SARS-CoV-2 °Patients, residents, and visitors to healthcare settings: Interim Infection Prevention and Control Recommendations for Healthcare Personnel During the Coronavirus Disease 2019 (COVID-19) Pandemic °Additional setting-specific guidance and recommendations are available. °These  recommendations on quarantine and isolation do apply to K[employer]12 School   settings. Additional guidance is available here: Overview of COVID-19 Quarantine for K-12 Schools °Travelers: Travel information and recommendations °Congregate facilities and other settings: guidance pages for community, work, and school settings °Ongoing COVID-19 exposure FAQs °I live with someone with COVID-19, but I cannot be separated from them. How do we manage quarantine in this situation? °It is very important for people with COVID-19 to remain apart from other people, if possible, even if they are living together. If separation of the person with COVID-19 from others that they live with is not possible, the other people that they live with will have ongoing exposure, meaning they will be repeatedly exposed until that person is no longer able to spread the virus to other people. In this situation, there are precautions you can take to limit the spread of COVID-19: °The person with COVID-19 and everyone they live with should wear a well-fitting mask inside the home. °If possible, one person should care for the person with COVID-19 to limit the number of people who are in close contact with the infected person. °Take steps to protect yourself and others to reduce transmission in the home: °Quarantine if you are not up to date with your COVID-19 vaccines. °Isolate if you are sick or tested positive for COVID-19, even if you don't have symptoms. °Learn more about the public health recommendations for testing, mask use and quarantine of close contacts, like yourself, who have ongoing exposure. These recommendations differ depending on your vaccination status. °What should I do if I have ongoing exposure to COVID-19 from someone I live with? °Recommendations for this situation depend on your vaccination status: °If you are not up to date on COVID-19 vaccines and have ongoing exposure to COVID-19, you should: °Begin quarantine immediately and  continue to quarantine throughout the isolation period of the person with COVID-19. °Continue to quarantine for an additional 5 days starting the day after the end of isolation for the person with COVID-19. °Get tested at least 5 days after the end of isolation of the infected person that lives with them. °If you test negative, you can leave the home but should continue to wear a well-fitting mask when around others at home and in public until 10 days after the end of isolation for the person with COVID-19. °Isolate immediately if you develop symptoms of COVID-19 or test positive. °If you are up to date with COVID-19 vaccines and have ongoing exposure to COVID-19, you should: °Get tested at least 5 days after your first exposure. A person with COVID-19 is considered infectious starting 2 days before they develop symptoms, or 2 days before the date of their positive test if they do not have symptoms. °Get tested again at least 5 days after the end of isolation for the person with COVID-19. °Wear a well-fitting mask when you are around the person with COVID-19, and do this throughout their isolation period. °Wear a well-fitting mask around others for 10 days after the infected person's isolation period ends. °Isolate immediately if you develop symptoms of COVID-19 or test positive. °What should I do if multiple people I live with test positive for COVID-19 at different times? °Recommendations for this situation depend on your vaccination status: °If you are not up to date with your COVID-19 vaccines, you should: °Quarantine throughout the isolation period of any infected person that you live with. °Continue to quarantine until 5 days after the end of isolation date for the most recently infected person that lives with you. For example, if   the last day of isolation of the person most recently infected with COVID-19 was June 30, the new 5-day quarantine period starts on July 1. °Get tested at least 5 days after the end  of isolation for the most recently infected person that lives with you. °Wear a well-fitting mask when you are around any person with COVID-19 while that person is in isolation. °Wear a well-fitting mask when you are around other people until 10 days after your last close contact. °Isolate immediately if you develop symptoms of COVID-19 or test positive. °If you are up to date with your COVID-19 vaccines, you should: °Get tested at least 5 days after your first exposure. A person with COVID-19 is considered infectious starting 2 days before they developed symptoms, or 2 days before the date of their positive test if they do not have symptoms. °Get tested again at least 5 days after the end of isolation for the most recently infected person that lives with you. °Wear a well-fitting mask when you are around any person with COVID-19 while that person is in isolation. °Wear a well-fitting mask around others for 10 days after the end of isolation for the most recently infected person that lives with you. For example, if the last day of isolation for the person most recently infected with COVID-19 was June 30, the new 10-day period to wear a well-fitting mask indoors in public starts on July 1. °Isolate immediately if you develop symptoms of COVID-19 or test positive. °I had COVID-19 and completed isolation. Do I have to quarantine or get tested if someone I live with gets COVID-19 shortly after I completed isolation? °No. If you recently completed isolation and someone that lives with you tests positive for the virus that causes COVID-19 shortly after the end of your isolation period, you do not have to quarantine or get tested as long as you do not develop new symptoms. Once all of the people that live together have completed isolation or quarantine, refer to the guidance below for new exposures to COVID-19. °If you had COVID-19 in the previous 90 days and then came into close contact with someone with COVID-19, you do  not have to quarantine or get tested if you do not have symptoms. But you should: °Wear a well-fitting mask indoors in public for 10 days after your last close contact. °Monitor for COVID-19 symptoms for 10 days from the date of your last close contact. °Isolate immediately and get tested if symptoms develop. °If more than 90 days have passed since your recovery from infection, follow CDC's recommendations for close contacts. These recommendations will differ depending on your vaccination status. °12/21/2020 °Content source: National Center for Immunization and Respiratory Diseases (NCIRD), Division of Viral Diseases °This information is not intended to replace advice given to you by your health care provider. Make sure you discuss any questions you have with your health care provider. °Document Revised: 04/26/2021 Document Reviewed: 04/26/2021 °Elsevier Patient Education © 2022 [employer] ° °

## 2021-10-31 NOTE — Progress Notes (Signed)
Virtual Visit via Video   This visit type was conducted due to national recommendations for restrictions regarding the COVID-19 Pandemic (e.g. social distancing) in an effort to limit this patient's exposure and mitigate transmission in our community.  Due to her co-morbid illnesses, this patient is at least at moderate risk for complications without adequate follow up.  This format is felt to be most appropriate for this patient at this time.  All issues noted in this document were discussed and addressed.  A limited physical exam was performed with this format.    This visit type was conducted due to national recommendations for restrictions regarding the COVID-19 Pandemic (e.g. social distancing) in an effort to limit this patient's exposure and mitigate transmission in our community.  Patients identity confirmed using two different identifiers.  This format is felt to be most appropriate for this patient at this time.  All issues noted in this document were discussed and addressed.  No physical exam was performed (except for noted visual exam findings with Video Visits).    Date:  11/02/2021   ID:  Felicia Garrett, DOB 03-03-1956, MRN [id number]  Patient Location:  Home  Provider location:   Office    Chief Complaint:  "I have tested positive for COVID"  History of Present Illness:    Felicia Garrett is a 66 y.o. female who presents via video conferencing for a telehealth visit today.    The patient does have symptoms concerning for COVID-19 infection (fever, chills, cough, or new shortness of breath).   She presents today for virtual visit. She prefers this method of contact due to COVID-19 pandemic.  She is here today for treatment of COVID. She states she developed chills late Saturday which progressed to non-productive cough, headache. She has had low-grade fever. She tested positive earlier today with rapid test.    She admits she attended a Mediterranean diet class and two exercise  classes  and this is likely how she was exposed.     Past Medical History:  Diagnosis Date   Anemia    Arthritis    Depression    Diabetes mellitus    Hyperlipidemia    Thyroid nodule    Past Surgical History:  Procedure Laterality Date   LAPAROSCOPIC GASTRIC BANDING  06/14/08   THYROIDECTOMY N/A 10/08/2014   Procedure: NEAR TOTAL THYROIDECTOMY;  Surgeon: Valarie Merino, MD;  Location: [facility];  Service: General;  Laterality: N/A;   UTERINE FIBROID EMBOLIZATION  10 yrs ago     Current Meds  Medication Sig   benzonatate (TESSALON PERLES) 100 MG capsule Take 1 capsule (100 mg total) by mouth 3 (three) times daily as needed for cough.   buPROPion (WELLBUTRIN XL) 150 MG 24 hr tablet TAKE 1 TABLET BY MOUTH EVERY DAY   Dapagliflozin-metFORMIN HCl ER (XIGDUO XR) 01-999 MG TB24 Take 2 tabs po qam   doxycycline (VIBRA-TABS) 100 MG tablet Take 1 tablet (100 mg total) by mouth 2 (two) times daily.   HYDROcodone bit-homatropine (HYDROMET) 5-1.5 MG/5ML syrup Take 5 mLs by mouth every 6 (six) hours as needed for cough.   Iron-FA-B Cmp-C-Biot-Probiotic (FUSION PLUS) CAPS TAKE 1 CAPSULE BY ORAL ROUTE EVERY DAY BETWEEN MEALS   levothyroxine (SYNTHROID) 137 MCG tablet TAKE 1 TABLET (137 MCG TOTAL) BY MOUTH DAILY BEFORE BREAKFAST. MONDAY - FRIDAY   losartan (COZAAR) 50 MG tablet Take 1 tablet (50 mg total) by mouth daily.   methocarbamol (ROBAXIN-750) 750 MG tablet Take 1 tablet (750 mg  total) by mouth 3 (three) times daily.   molnupiravir EUA (LAGEVRIO) 200 mg CAPS capsule Take 4 capsules (800 mg total) by mouth 2 (two) times daily for 5 days.   nystatin cream (MYCOSTATIN) APPLY TO AFFECTED AREA TWICE A DAY   simvastatin (ZOCOR) 80 MG tablet Take 1 tablet (80 mg total) by mouth every evening.   tirzepatide (MOUNJARO) 10 MG/0.5ML Pen Inject 10 mg into the skin once a week.     Allergies:   Penicillins   Social History   Tobacco Use   Smoking status: Never   Smokeless tobacco: Never  Vaping  Use   Vaping Use: Never used  Substance Use Topics   Alcohol use: No   Drug use: No     Family Hx: The patient's family history includes Diabetes in her father; Hyperlipidemia in her mother; Hypertension in her mother; Hyperthyroidism in her mother.  ROS:   Please see the history of present illness.    Review of Systems  Constitutional:  Positive for fever and malaise/fatigue.  Respiratory:  Positive for cough.   Cardiovascular: Negative.  Negative for chest pain.  Gastrointestinal: Negative.   Neurological:  Positive for headaches.  Psychiatric/Behavioral: Negative.     All other systems reviewed and are negative.   Labs/Other Tests and Data Reviewed:    Recent Labs: 09/14/2021: ALT 14; BUN 14; Creatinine 0.78; Hemoglobin 11.2; Platelet Count 291; Potassium 3.8; Sodium 138 10/26/2021: TSH 6.530   Recent Lipid Panel Lab Results  Component Value Date/Time   CHOL 182 08/23/2021 03:19 PM   TRIG 61 08/23/2021 03:19 PM   HDL 79 08/23/2021 03:19 PM   CHOLHDL 2.3 08/23/2021 03:19 PM   LDLCALC 91 08/23/2021 03:19 PM    Wt Readings from Last 3 Encounters:  10/26/21 202 lb 6.4 oz (91.8 kg)  09/14/21 196 lb 1 oz (88.9 kg)  08/23/21 190 lb 3.2 oz (86.3 kg)     Exam:    Vital Signs:  There were no vitals taken for this visit.    Physical Exam Vitals and nursing note reviewed.  Constitutional:      Appearance: She is obese. She is ill-appearing.  HENT:     Head: Normocephalic and atraumatic.     Nose:     Comments: Masked     Mouth/Throat:     Comments: Masked  Eyes:     Extraocular Movements: Extraocular movements intact.  Pulmonary:     Effort: Pulmonary effort is normal.  Musculoskeletal:     Cervical back: Normal range of motion.  Neurological:     Mental Status: She is alert and oriented to person, place, and time.  Psychiatric:        Mood and Affect: Affect normal.    ASSESSMENT & PLAN:     1. COVID-19 Due to her risk for complications, rx Lagevrio was  sent to her local pharmacy. Possible side effects d/w patient. Advised to monitor her BP readings and move around every 2 hours or so. Advised patient to take Vitamin C, D, Zinc.  Keep yourself hydrated with a lot of water and rest. Take Delsym for cough and Mucinex as needed. Take Tylenol or pain reliever every 4-6 hours as needed for pain/fever/body ache. If you have elevated blood pressure, you can take OTC Coricidin. You can also take OTC Oscillococcinum, a homeopathic remedy,  to help with your symptoms.  Educated patient that if symptoms get worse or if he/she experiences any SOB, chest pain or pain in their  legs to seek immediate emergency care. Continue to monitor your oxygen levels. Call office ASAP if you have any questions. Quarantine for 5 days if tested positive and no symptoms or 10 days if tested positive and you are with symptoms. Wear a mask around other people.   COVID-19 Education: The signs and symptoms of COVID-19 were discussed with the patient and how to seek care for testing (follow up with PCP or arrange E-visit).  The importance of social distancing was discussed today.  Patient Risk:   After full review of this patients clinical status, I feel that they are at least moderate risk at this time.  Time:   Today, I have spent 11 minutes/ seconds with the patient with telehealth technology discussing above diagnoses.     Medication Adjustments/Labs and Tests Ordered: Current medicines are reviewed at length with the patient today.  Concerns regarding medicines are outlined above.   Tests Ordered: No orders of the defined types were placed in this encounter.   Medication Changes: Meds ordered this encounter  Medications   molnupiravir EUA (LAGEVRIO) 200 mg CAPS capsule    Sig: Take 4 capsules (800 mg total) by mouth 2 (two) times daily for 5 days.    Dispense:  40 capsule    Refill:  0   benzonatate (TESSALON PERLES) 100 MG capsule    Sig: Take 1 capsule (100 mg total)  by mouth 3 (three) times daily as needed for cough.    Dispense:  30 capsule    Refill:  1   HYDROcodone bit-homatropine (HYDROMET) 5-1.5 MG/5ML syrup    Sig: Take 5 mLs by mouth every 6 (six) hours as needed for cough.    Dispense:  120 mL    Refill:  0    Disposition:  Follow up prn  Signed, [name], MD

## 2021-11-01 ENCOUNTER — Other Ambulatory Visit: Payer: Self-pay

## 2021-11-01 ENCOUNTER — Encounter: Payer: Self-pay | Admitting: Internal Medicine

## 2021-11-01 MED ORDER — MOUNJARO 7.5 MG/0.5ML ~~LOC~~ SOAJ
7.5000 mg | SUBCUTANEOUS | 1 refills | Status: DC
Start: 2021-11-01 — End: 2021-12-13

## 2021-12-07 ENCOUNTER — Ambulatory Visit: Payer: Medicare PPO | Admitting: Internal Medicine

## 2021-12-13 ENCOUNTER — Other Ambulatory Visit: Payer: Self-pay

## 2021-12-13 ENCOUNTER — Inpatient Hospital Stay: Payer: Medicare PPO

## 2021-12-13 ENCOUNTER — Other Ambulatory Visit: Payer: Self-pay | Admitting: *Deleted

## 2021-12-13 ENCOUNTER — Inpatient Hospital Stay: Payer: Medicare PPO | Admitting: Internal Medicine

## 2021-12-13 ENCOUNTER — Inpatient Hospital Stay: Payer: Medicare PPO | Attending: Internal Medicine

## 2021-12-13 VITALS — BP 115/66 | HR 85 | Temp 96.8°F | Resp 18 | Wt 197.4 lb

## 2021-12-13 DIAGNOSIS — D72819 Decreased white blood cell count, unspecified: Secondary | ICD-10-CM | POA: Insufficient documentation

## 2021-12-13 DIAGNOSIS — D509 Iron deficiency anemia, unspecified: Secondary | ICD-10-CM

## 2021-12-13 LAB — CBC WITH DIFFERENTIAL (CANCER CENTER ONLY)
Abs Immature Granulocytes: 0 10*3/uL (ref 0.00–0.07)
Basophils Absolute: 0 10*3/uL (ref 0.0–0.1)
Basophils Relative: 1 %
Eosinophils Absolute: 0.1 10*3/uL (ref 0.0–0.5)
Eosinophils Relative: 2 %
HCT: 38.1 % (ref 36.0–46.0)
Hemoglobin: 11.5 g/dL — ABNORMAL LOW (ref 12.0–15.0)
Immature Granulocytes: 0 %
Lymphocytes Relative: 27 %
Lymphs Abs: 0.8 10*3/uL (ref 0.7–4.0)
MCH: 20.9 pg — ABNORMAL LOW (ref 26.0–34.0)
MCHC: 30.2 g/dL (ref 30.0–36.0)
MCV: 69.4 fL — ABNORMAL LOW (ref 80.0–100.0)
Monocytes Absolute: 0.3 10*3/uL (ref 0.1–1.0)
Monocytes Relative: 9 %
Neutro Abs: 1.8 10*3/uL (ref 1.7–7.7)
Neutrophils Relative %: 61 %
Platelet Count: 288 10*3/uL (ref 150–400)
RBC: 5.49 MIL/uL — ABNORMAL HIGH (ref 3.87–5.11)
RDW: 18.7 % — ABNORMAL HIGH (ref 11.5–15.5)
WBC Count: 3 10*3/uL — ABNORMAL LOW (ref 4.0–10.5)
nRBC: 0 % (ref 0.0–0.2)

## 2021-12-13 LAB — LACTATE DEHYDROGENASE: LDH: 124 U/L (ref 98–192)

## 2021-12-13 NOTE — Progress Notes (Signed)
?    [facility] ?[phone]   [phone] ? ?OFFICE PROGRESS NOTE ? ?[name], MD ?[address] ?[place] [address] ? ?DIAGNOSIS: Leukocytopenia and mild anemia.  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 ? ?PRIOR THERAPY: None ? ?CURRENT THERAPY: Iron tablets with fusion +1 capsule p.o. daily ? ?INTERVAL HISTORY: ?Felicia Garrett 66 y.o. female returns to the clinic today for follow-up visit.  The patient is feeling fine today with no concerning complaints.  She continues to tolerate her treatment with the fusion plus fairly well.  She denied having any current fever or chills.  She has no nausea, vomiting, diarrhea or constipation.  She has no headache or visual changes.  She denied having any weight loss or night sweats.  She is here today for evaluation and repeat blood work. ? ?MEDICAL HISTORY: ?Past Medical History:  ?Diagnosis Date  ? Anemia   ? Arthritis   ? Depression   ? Diabetes mellitus   ? Hyperlipidemia   ? Thyroid nodule   ? ? ?ALLERGIES:  is allergic to penicillins. ? ?MEDICATIONS:  ?Current Outpatient Medications  ?Medication Sig Dispense Refill  ? buPROPion (WELLBUTRIN XL) 150 MG 24 hr tablet TAKE 1 TABLET BY MOUTH EVERY DAY 90 tablet 2  ? Dapagliflozin-metFORMIN HCl ER (XIGDUO XR) 01-999 MG TB24 Take 2 tabs po qam 180 tablet 2  ? DRYSOL 20 % external solution Apply topically.    ? Iron-FA-B Cmp-C-Biot-Probiotic (FUSION PLUS) CAPS TAKE 1 CAPSULE BY ORAL ROUTE EVERY DAY BETWEEN MEALS 30 capsule 1  ? levothyroxine (SYNTHROID) 137 MCG tablet TAKE 1 TABLET (137 MCG TOTAL) BY MOUTH DAILY BEFORE BREAKFAST. MONDAY - FRIDAY 80 tablet 2  ? losartan (COZAAR) 50 MG tablet Take 1 tablet (50 mg total) by mouth daily. 90 tablet 1  ? simvastatin (ZOCOR) 80 MG tablet Take 1 tablet (80 mg total) by mouth every evening.  90 tablet 2  ? sodium fluoride (PREVIDENT 5000 PLUS) 1.1 % CREA dental cream Denta 5000 Plus 1.1 % cream ? USE AS DIRECTED    ? tirzepatide (MOUNJARO) 10 MG/0.5ML Pen Inject 10 mg into the skin once a week. 0.5 mL 0  ? methocarbamol (ROBAXIN-750) 750 MG tablet Take 1 tablet (750 mg total) by mouth 3 (three) times daily. (Patient not taking: Reported on 12/13/2021) 40 tablet 1  ? ?No current facility-administered medications for this visit.  ? ? ?SURGICAL HISTORY:  ?Past Surgical History:  ?Procedure Laterality Date  ? LAPAROSCOPIC GASTRIC BANDING  06/14/08  ? THYROIDECTOMY N/A 10/08/2014  ? Procedure: NEAR TOTAL THYROIDECTOMY;  Surgeon: Valarie Merino, MD;  Location: [facility];  Service: General;  Laterality: N/A;  ? UTERINE FIBROID EMBOLIZATION  10 yrs ago  ? ? ?REVIEW OF SYSTEMS:  A comprehensive review of systems was negative.  ? ?PHYSICAL EXAMINATION: General appearance: alert, cooperative, and no distress ?Head: Normocephalic, without obvious abnormality, atraumatic ?Neck: no adenopathy, no JVD, supple, symmetrical, trachea midline, and thyroid not enlarged, symmetric, no tenderness/mass/nodules ?Lymph nodes: Cervical, supraclavicular, and axillary nodes normal. ?Resp: clear to auscultation bilaterally ?Back: symmetric, no curvature. ROM normal. No CVA tenderness. ?Cardio: regular rate and rhythm, S1, S2 normal, no murmur, click, rub or gallop ?GI: soft, non-tender; bowel sounds normal; no masses,  no organomegaly ?Extremities: extremities normal, atraumatic, no cyanosis or edema ? ?ECOG PERFORMANCE STATUS: 1 - Symptomatic but  completely ambulatory ? ?Blood pressure 115/66, pulse 85, temperature (!) 96.8 ?F (36 ?C), temperature source Tympanic, resp. rate 18, weight 197 lb 7 oz (89.6 kg), SpO2 100 %. ? ?LABORATORY DATA: ?Lab Results  ?Component Value Date  ? WBC 3.0 (L) 12/13/2021  ? HGB 11.5 (L) 12/13/2021  ? HCT 38.1 12/13/2021  ? MCV 69.4 (L) 12/13/2021  ? PLT 288 12/13/2021  ? ? ?  Chemistry   ?   ?Component  Value Date/Time  ? NA 138 09/14/2021 1059  ? NA 140 08/23/2021 1519  ? K 3.8 09/14/2021 1059  ? CL 105 09/14/2021 1059  ? CO2 28 09/14/2021 1059  ? BUN 14 09/14/2021 1059  ? BUN 13 08/23/2021 1519  ? CREATININE 0.78 09/14/2021 1059  ?    ?Component Value Date/Time  ? CALCIUM 9.1 09/14/2021 1059  ? ALKPHOS 41 09/14/2021 1059  ? AST 15 09/14/2021 1059  ? ALT 14 09/14/2021 1059  ? BILITOT 0.6 09/14/2021 1059  ?  ? ? ? ?RADIOGRAPHIC STUDIES: ?No results found. ? 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  The patient is currently on iron tablet with fusion plus and tolerating it fairly well. ?Repeat CBC today showed persistent mild anemia and leukocytopenia. ?Her previous blood work showed low normal vitamin B12 level as well as iron saturation. ?I recommended for the patient to continue with her current treatment with the iron tablet and I advised her to start taking over-the-counter vitamin B12 tablets. ?I will send her to the lab today for repeat hemoglobin electrophoresis.  We were unable to find her previous test to confirm the hemoglobinopathy. ?I will see her back for follow-up visit in 3 months for evaluation and repeat blood work. ?The patient was advised to call immediately if she has any other concerning symptoms in the interval. ?The patient voices understanding of current disease status and treatment options and is in agreement with the current care plan. ? ?All questions were answered. The patient knows to call the clinic with any problems, questions or concerns. We can certainly see the patient much sooner if necessary. ? ?The total time spent in the appointment was 20 minutes. ? ?Disclaimer: This note was dictated with voice recognition software. Similar sounding words can inadvertently be transcribed and may not be  corrected upon review. ? ? ?  ?   ?

## 2021-12-15 LAB — HGB FRACTIONATION CASCADE
Hgb A2: 2.5 % (ref 1.8–3.2)
Hgb A: 97.5 % (ref 96.4–98.8)
Hgb F: 0 % (ref 0.0–2.0)
Hgb S: 0 %

## 2021-12-21 ENCOUNTER — Ambulatory Visit: Payer: Medicare PPO | Admitting: Internal Medicine

## 2021-12-21 ENCOUNTER — Encounter: Payer: Self-pay | Admitting: Internal Medicine

## 2021-12-21 VITALS — BP 116/68 | HR 78 | Temp 98.0°F | Ht 68.8 in | Wt 196.4 lb

## 2021-12-21 DIAGNOSIS — E785 Hyperlipidemia, unspecified: Secondary | ICD-10-CM | POA: Diagnosis not present

## 2021-12-21 DIAGNOSIS — E1169 Type 2 diabetes mellitus with other specified complication: Secondary | ICD-10-CM | POA: Diagnosis not present

## 2021-12-21 DIAGNOSIS — Z6829 Body mass index (BMI) 29.0-29.9, adult: Secondary | ICD-10-CM | POA: Diagnosis not present

## 2021-12-21 DIAGNOSIS — E890 Postprocedural hypothyroidism: Secondary | ICD-10-CM | POA: Diagnosis not present

## 2021-12-21 MED ORDER — ATORVASTATIN CALCIUM 40 MG PO TABS: 40.0000 mg | ORAL_TABLET | Freq: Every day | ORAL | 1 refills | Status: DC

## 2021-12-21 MED ORDER — MOUNJARO 12.5 MG/0.5ML ~~LOC~~ SOAJ: 12.5000 mg | SUBCUTANEOUS | 3 refills | Status: DC

## 2021-12-21 NOTE — Patient Instructions (Signed)

## 2021-12-21 NOTE — Progress Notes (Signed)
?Jeri Cos Llittleton,acting as a Neurosurgeon for [name], MD.,have documented all relevant documentation on the behalf of [name], MD,as directed by  [name], MD while in the presence of [name], MD.  ?This visit occurred during the SARS-CoV-2 public health emergency.  Safety protocols were in place, including screening questions prior to the visit, additional usage of staff PPE, and extensive cleaning of exam room while observing appropriate contact time as indicated for disinfecting solutions. ? ?Subjective:  ?  ? Patient ID: Felicia Garrett , female    DOB: 06/20/1956 , 66 y.o.   MRN: [id number] ? ? ?Chief Complaint  ?Patient presents with  ? Diabetes  ? ? ?HPI ? ?Patient presents today for f/u diabetes. She reports compliance with her medications. Patient stated she needs a refill on her Mounjaro. Additionally, she want to further adjust cholesterol meds to reach LDL goal <70.  ? ?She is excited about her upcoming trip to [place].  ? ?Diabetes ?She presents for her follow-up diabetic visit. She has type 2 diabetes mellitus. Her disease course has been stable. There are no hypoglycemic associated symptoms. Pertinent negatives for diabetes include no polydipsia, no polyphagia and no polyuria. There are no hypoglycemic complications. Risk factors for coronary artery disease include diabetes mellitus, dyslipidemia, hypertension, obesity and post-menopausal. She participates in exercise every other day. Eye exam is current.   ? ?Past Medical History:  ?Diagnosis Date  ? Anemia   ? Arthritis   ? Depression   ? Diabetes mellitus   ? Hyperlipidemia   ? Thyroid nodule   ?  ? ?Family History  ?Problem Relation Age of Onset  ? Hypertension Mother   ? Hyperlipidemia Mother   ? Hyperthyroidism Mother   ? Diabetes Father   ? ? ? ?Current Outpatient Medications:  ?  atorvastatin (LIPITOR) 40 MG tablet, Take 1 tablet (40 mg total) by mouth daily., Disp: 90 tablet, Rfl: 1 ?  buPROPion (WELLBUTRIN XL) 150  MG 24 hr tablet, TAKE 1 TABLET BY MOUTH EVERY DAY, Disp: 90 tablet, Rfl: 2 ?  Dapagliflozin-metFORMIN HCl ER (XIGDUO XR) 01-999 MG TB24, Take 2 tabs po qam, Disp: 180 tablet, Rfl: 2 ?  DRYSOL 20 % external solution, Apply topically., Disp: , Rfl:  ?  Iron-FA-B Cmp-C-Biot-Probiotic (FUSION PLUS) CAPS, TAKE 1 CAPSULE BY ORAL ROUTE EVERY DAY BETWEEN MEALS, Disp: 30 capsule, Rfl: 1 ?  levothyroxine (SYNTHROID) 137 MCG tablet, TAKE 1 TABLET (137 MCG TOTAL) BY MOUTH DAILY BEFORE BREAKFAST. MONDAY - FRIDAY, Disp: 80 tablet, Rfl: 2 ?  losartan (COZAAR) 50 MG tablet, Take 1 tablet (50 mg total) by mouth daily., Disp: 90 tablet, Rfl: 1 ?  methocarbamol (ROBAXIN-750) 750 MG tablet, Take 1 tablet (750 mg total) by mouth 3 (three) times daily., Disp: 40 tablet, Rfl: 1 ?  simvastatin (ZOCOR) 80 MG tablet, Take 1 tablet (80 mg total) by mouth every evening., Disp: 90 tablet, Rfl: 2 ?  sodium fluoride (PREVIDENT 5000 PLUS) 1.1 % CREA dental cream, Denta 5000 Plus 1.1 % cream  USE AS DIRECTED, Disp: , Rfl:  ?  tirzepatide (MOUNJARO) 12.5 MG/0.5ML Pen, Inject 12.5 mg into the skin once a week., Disp: 0.5 mL, Rfl: 3  ? ?Allergies  ?Allergen Reactions  ? Penicillins Shortness Of Breath  ?  CHEST PAIN  ?  ? ?Review of Systems  ?Constitutional: Negative.   ?Eyes: Negative.   ?Respiratory: Negative.    ?Cardiovascular: Negative.   ?Gastrointestinal: Negative.   ?Endocrine: Negative  for polydipsia, polyphagia and polyuria.  ?Musculoskeletal: Negative.   ?Skin: Negative.   ?Neurological: Negative.   ?Psychiatric/Behavioral: Negative.     ? ?Today's Vitals  ? 12/21/21 1156  ?BP: 116/68  ?Pulse: 78  ?Temp: 98 ?F (36.7 ?C)  ?Weight: 196 lb 6.4 oz (89.1 kg)  ?Height: 5' 8.8" (1.748 m)  ?PainSc: 0-No pain  ? ?Body mass index is 29.17 kg/m?.  ?Wt Readings from Last 3 Encounters:  ?12/21/21 196 lb 6.4 oz (89.1 kg)  ?12/13/21 197 lb 7 oz (89.6 kg)  ?10/26/21 202 lb 6.4 oz (91.8 kg)  ?  ? ?Objective:  ?Physical Exam ?Vitals and nursing note  reviewed.  ?Constitutional:   ?   Appearance: Normal appearance.  ?HENT:  ?   Head: Normocephalic and atraumatic.  ?   Nose:  ?   Comments: Masked  ?   Mouth/Throat:  ?   Comments: Masked  ?Eyes:  ?   Extraocular Movements: Extraocular movements intact.  ?Cardiovascular:  ?   Rate and Rhythm: Normal rate and regular rhythm.  ?   Heart sounds: Normal heart sounds.  ?Pulmonary:  ?   Effort: Pulmonary effort is normal.  ?   Breath sounds: Normal breath sounds.  ?Musculoskeletal:  ?   Cervical back: Normal range of motion.  ?Skin: ?   General: Skin is warm.  ?Neurological:  ?   General: No focal deficit present.  ?   Mental Status: She is alert.  ?Psychiatric:     ?   Mood and Affect: Mood normal.     ?   Behavior: Behavior normal.  ?  ? ?   ?Assessment And Plan:  ?   ?1. Hyperlipidemia associated with type 2 diabetes mellitus ([place]) ?Comments: Last LDL was 91 in Nov 2022. I will send rx atorvastatin 40mg  daily. We will need to recheck her cholesterol in 6-8 weeks for re-evaluation. She would like to go to next dose of Mounjaro, 12.5mg  weekly. She will f/u in 3-4 months for her next diabetes check. ?- Hemoglobin A1c ? ?2. Postsurgical hypothyroidism ?Comments: Last TSH 6.0, levothyroxine dose was increased to 143mcg M-F and 1/2 tab on Sat. I will recheck TSh today.  ?- TSH ? ?3. BMI 29.0-29.9,adult ?Comments: She is encouraged to aim for at least 150 minutes of exercise/week with at least two days of strength training.  ?  ?Patient was given opportunity to ask questions. Patient verbalized understanding of the plan and was able to repeat key elements of the plan. All questions were answered to their satisfaction.  ? ?I, Maximino Greenland, MD, have reviewed all documentation for this visit. The documentation on 12/21/21 for the exam, diagnosis, procedures, and orders are all accurate and complete.  ? ?IF YOU HAVE BEEN REFERRED TO A SPECIALIST, IT MAY TAKE 1-2 WEEKS TO SCHEDULE/PROCESS THE REFERRAL. IF YOU HAVE NOT HEARD  FROM US/SPECIALIST IN TWO WEEKS, PLEASE GIVE [place] A CALL AT [phone].  ? ?THE PATIENT IS ENCOURAGED TO PRACTICE SOCIAL DISTANCING DUE TO THE COVID-19 PANDEMIC.   ?

## 2021-12-22 LAB — HEMOGLOBIN A1C
Est. average glucose Bld gHb Est-mCnc: 126 mg/dL
Hgb A1c MFr Bld: 6 % — ABNORMAL HIGH (ref 4.8–5.6)

## 2021-12-22 LAB — TSH: TSH: 2.81 u[IU]/mL (ref 0.450–4.500)

## 2021-12-23 ENCOUNTER — Encounter: Payer: Self-pay | Admitting: Internal Medicine

## 2021-12-26 ENCOUNTER — Encounter: Payer: Self-pay | Admitting: Internal Medicine

## 2021-12-26 DIAGNOSIS — Z23 Encounter for immunization: Secondary | ICD-10-CM | POA: Diagnosis not present

## 2021-12-27 ENCOUNTER — Other Ambulatory Visit: Payer: Self-pay

## 2021-12-27 ENCOUNTER — Other Ambulatory Visit (HOSPITAL_COMMUNITY): Payer: Self-pay

## 2021-12-27 MED ORDER — MOUNJARO 12.5 MG/0.5ML ~~LOC~~ SOAJ
12.5000 mg | SUBCUTANEOUS | 3 refills | Status: DC
  Filled 2021-12-27: qty 2, 28d supply, fill #0

## 2021-12-29 ENCOUNTER — Other Ambulatory Visit: Payer: Self-pay | Admitting: Internal Medicine

## 2022-01-07 ENCOUNTER — Encounter: Payer: Self-pay | Admitting: Internal Medicine

## 2022-01-20 ENCOUNTER — Encounter: Payer: Self-pay | Admitting: Internal Medicine

## 2022-01-22 ENCOUNTER — Other Ambulatory Visit: Payer: Self-pay

## 2022-01-22 MED ORDER — MOUNJARO 15 MG/0.5ML ~~LOC~~ SOAJ: 15.0000 mg | SUBCUTANEOUS | 1 refills | Status: DC

## 2022-02-02 ENCOUNTER — Ambulatory Visit: Payer: Medicare PPO | Admitting: Podiatry

## 2022-02-02 DIAGNOSIS — B351 Tinea unguium: Secondary | ICD-10-CM | POA: Diagnosis not present

## 2022-02-02 DIAGNOSIS — M79674 Pain in right toe(s): Secondary | ICD-10-CM

## 2022-02-02 DIAGNOSIS — L84 Corns and callosities: Secondary | ICD-10-CM | POA: Diagnosis not present

## 2022-02-02 DIAGNOSIS — M79675 Pain in left toe(s): Secondary | ICD-10-CM | POA: Diagnosis not present

## 2022-02-02 DIAGNOSIS — B49 Unspecified mycosis: Secondary | ICD-10-CM

## 2022-02-02 MED ORDER — TERBINAFINE HCL 250 MG PO TABS: 250.0000 mg | ORAL_TABLET | Freq: Every day | ORAL | 0 refills | Status: DC

## 2022-02-02 NOTE — Progress Notes (Signed)
Subjective:  ? ?Patient ID: Felicia Garrett, female   DOB: 66 y.o.   MRN: [id number]  ? ?HPI ?Patient presents stating that she is having quite a bit of pain in her left fifth digit with lesion formation has nails that are tender and has a infection on the dorsum of both feet and in the left arch after the and is real ? ? ?ROS ? ? ?   ?Objective:  ?Physical Exam  ?Neurovascular status found to be intact muscle strength is adequate currently range of motion adequate with a distal lateral keratotic lesion digit 5 left painful when pressed and thick yellow brittle nailbeds 1-5 both feet that are thick and she cannot take care of.  Also is noted to have quite a bit of irritation between the lesser digits left foot and into the left arch with patient found to have good digital perfusion well oriented x3 ? ?   ?Assessment:  ?Probability for a dermatitis or fungal infection left over right foot with mycotic nail infection with pain 1-5 both feet with lesion fourth fifth left pain ? ?   ?Plan:  ?H&P all conditions discussed and today I am going to place her on an antifungal for 30 days and I gave her instructions on taking this.  She will take Lamisil for 30 days and call if any issues were to occur and today I went ahead I debrided nailbeds 1-5 both feet neurogenic bleeding and lesion left no angiogenic bleeding and will be seen back as needed ?   ? ? ?

## 2022-02-12 ENCOUNTER — Other Ambulatory Visit: Payer: Medicare PPO

## 2022-02-12 DIAGNOSIS — E785 Hyperlipidemia, unspecified: Secondary | ICD-10-CM | POA: Diagnosis not present

## 2022-02-12 DIAGNOSIS — E1169 Type 2 diabetes mellitus with other specified complication: Secondary | ICD-10-CM | POA: Diagnosis not present

## 2022-02-13 ENCOUNTER — Other Ambulatory Visit: Payer: Self-pay

## 2022-02-13 ENCOUNTER — Encounter: Payer: Self-pay | Admitting: Internal Medicine

## 2022-02-13 LAB — LIPID PANEL
Chol/HDL Ratio: 1.8 ratio (ref 0.0–4.4)
Cholesterol, Total: 144 mg/dL (ref 100–199)
HDL: 78 mg/dL (ref >39)
LDL Chol Calc (NIH): 56 mg/dL (ref 0–99)
Triglycerides: 43 mg/dL (ref 0–149)
VLDL Cholesterol Cal: 10 mg/dL (ref 5–40)

## 2022-02-13 LAB — ALT: ALT: 21 IU/L (ref 0–32)

## 2022-02-13 MED ORDER — MOUNJARO 15 MG/0.5ML ~~LOC~~ SOAJ: 15.0000 mg | SUBCUTANEOUS | 1 refills | Status: DC

## 2022-02-21 ENCOUNTER — Telehealth: Payer: Self-pay | Admitting: Internal Medicine

## 2022-02-21 NOTE — Telephone Encounter (Signed)
Called patient regarding upcoming June appointments, left a voicemail. 

## 2022-03-01 ENCOUNTER — Other Ambulatory Visit: Payer: Self-pay | Admitting: Internal Medicine

## 2022-03-07 ENCOUNTER — Ambulatory Visit: Payer: Medicare PPO | Admitting: Physician Assistant

## 2022-03-07 ENCOUNTER — Encounter: Payer: Self-pay | Admitting: Physician Assistant

## 2022-03-07 VITALS — BP 111/71 | HR 92 | Temp 97.3°F | Ht 68.0 in | Wt 194.0 lb

## 2022-03-07 DIAGNOSIS — J208 Acute bronchitis due to other specified organisms: Secondary | ICD-10-CM

## 2022-03-07 DIAGNOSIS — B9689 Other specified bacterial agents as the cause of diseases classified elsewhere: Secondary | ICD-10-CM | POA: Diagnosis not present

## 2022-03-07 MED ORDER — AZITHROMYCIN 250 MG PO TABS: ORAL_TABLET | ORAL | 0 refills | Status: DC

## 2022-03-07 MED ORDER — BENZONATATE 100 MG PO CAPS: 100.0000 mg | ORAL_CAPSULE | Freq: Two times a day (BID) | ORAL | 0 refills | Status: DC | PRN

## 2022-03-07 NOTE — Patient Instructions (Signed)
You are going to take azithromycin as directed, and you can use Tessalon Perles twice a day to help you with your cough.  Continue staying well-hydrated and getting plenty of rest.  I hope that you feel better soon.  Please let us know if there is anything else we can do for you.  Roney Jaffe, PA-C Physician Assistant [facility] [email or web address]   Acute Bronchitis, Adult  Acute bronchitis is sudden inflammation of the main airways (bronchi) that come off the windpipe (trachea) in the lungs. The swelling causes the airways to get smaller and make more mucus than normal. This can make it hard to breathe and can cause coughing or noisy breathing (wheezing). Acute bronchitis may last several weeks. The cough may last longer. Allergies, asthma, and exposure to smoke may make the condition worse. What are the causes? This condition can be caused by germs and by substances that irritate the lungs, including: Cold and flu viruses. The most common cause of this condition is the virus that causes the common cold. Bacteria. This is less common. Breathing in substances that irritate the lungs, including: Smoke from cigarettes and other forms of tobacco. Dust and pollen. Fumes from household cleaning products, gases, or burned fuel. Indoor or outdoor air pollution. What increases the risk? The following factors may make you more likely to develop this condition: A weak body's defense system, also called the immune system. A condition that affects your lungs and breathing, such as asthma. What are the signs or symptoms? Common symptoms of this condition include: Coughing. This may bring up clear, yellow, or green mucus from your lungs (sputum). Wheezing. Runny or stuffy nose. Having too much mucus in your lungs (chest congestion). Shortness of breath. Aches and pains, including sore throat or chest. How is this diagnosed? This  condition is usually diagnosed based on: Your symptoms and medical history. A physical exam. You may also have other tests, including tests to rule out other conditions, such as pneumonia. These tests include: A test of lung function. Test of a mucus sample to look for the presence of bacteria. Tests to check the oxygen level in your blood. Blood tests. Chest X-ray. How is this treated? Most cases of acute bronchitis clear up over time without treatment. Your health care provider may recommend: Drinking more fluids to help thin your mucus so it is easier to cough up. Taking inhaled medicine (inhaler) to improve air flow in and out of your lungs. Using a vaporizer or a humidifier. These are machines that add water to the air to help you breathe better. Taking a medicine that thins mucus and clears congestion (expectorant). Taking a medicine that prevents or stops coughing (cough suppressant). It is notcommon to take an antibiotic medicine for this condition. Follow these instructions at home:  Take over-the-counter and prescription medicines only as told by your health care provider. Use an inhaler, vaporizer, or humidifier as told by your health care provider. Take two teaspoons (10 mL) of honey at bedtime to lessen coughing at night. Drink enough fluid to keep your urine pale yellow. Do not use any products that contain nicotine or tobacco. These products include cigarettes, chewing tobacco, and vaping devices, such as e-cigarettes. If you need help quitting, ask your health care provider. Get plenty of rest. Return to your normal activities as told by your health care provider. Ask your health care provider what activities are safe for you. Keep all follow-up visits. This is important. How is  this prevented? To lower your risk of getting this condition again: Wash your hands often with soap and water for at least 20 seconds. If soap and water are not available, use hand  sanitizer. Avoid contact with people who have cold symptoms. Try not to touch your mouth, nose, or eyes with your hands. Avoid breathing in smoke or chemical fumes. Breathing smoke or chemical fumes will make your condition worse. Get the flu shot every year. Contact a health care provider if: Your symptoms do not improve after 2 weeks. You have trouble coughing up the mucus. Your cough keeps you awake at night. You have a fever. Get help right away if you: Cough up blood. Feel pain in your chest. Have severe shortness of breath. Faint or keep feeling like you are going to faint. Have a severe headache. Have a fever or chills that get worse. These symptoms may represent a serious problem that is an emergency. Do not wait to see if the symptoms will go away. Get medical help right away. Call your local emergency services (911 in the [place] Do not drive yourself to the hospital. Summary Acute bronchitis is inflammation of the main airways (bronchi) that come off the windpipe (trachea) in the lungs. The swelling causes the airways to get smaller and make more mucus than normal. Drinking more fluids can help thin your mucus so it is easier to cough up. Take over-the-counter and prescription medicines only as told by your health care provider. Do not use any products that contain nicotine or tobacco. These products include cigarettes, chewing tobacco, and vaping devices, such as e-cigarettes. If you need help quitting, ask your health care provider. Contact a health care provider if your symptoms do not improve after 2 weeks. This information is not intended to replace advice given to you by your health care provider. Make sure you discuss any questions you have with your health care provider. Document Revised: 01/11/2021 Document Reviewed: 01/11/2021 Elsevier Patient Education  2023 [employer].

## 2022-03-07 NOTE — Progress Notes (Signed)
New Patient Office Visit  Subjective    Patient ID: Felicia Garrett, female    DOB: 19-Jan-1956  Age: 66 y.o. MRN: [id number]  CC:  Chief Complaint  Patient presents with   Cough    HPI Felicia Garrett states that she has been having a persistent dry cough and laryngitis for the past 7 days.  States that cough is keeping her awake at night.  Denies fever or chills.  States that she is eating and drinking okay.  Unsure of sick contacts, but does endorse traveling to [facility] a couple of weeks ago and was recently at a large gathering.  States that she has been using Claritin, Zicam, Flonase, and Tessalon Perles with a small amount of relief.     Outpatient Encounter Medications as of 03/07/2022  Medication Sig   albuterol (VENTOLIN HFA) 108 (90 Base) MCG/ACT inhaler Inhale 2 puffs into the lungs every 6 (six) hours as needed for wheezing or shortness of breath.   azithromycin (ZITHROMAX) 250 MG tablet Take 2 tabs PO day 1, then take 1 tab PO once daily   benzonatate (TESSALON) 100 MG capsule Take 1 capsule (100 mg total) by mouth 2 (two) times daily as needed for cough.   HYDROcodone bit-homatropine (HYDROMET) 5-1.5 MG/5ML syrup Take 5 mLs by mouth every 6 (six) hours as needed for cough.   atorvastatin (LIPITOR) 40 MG tablet Take 1 tablet (40 mg total) by mouth daily.   buPROPion (WELLBUTRIN XL) 150 MG 24 hr tablet TAKE 1 TABLET BY MOUTH EVERY DAY   Dapagliflozin-metFORMIN HCl ER (XIGDUO XR) 01-999 MG TB24 Take 2 tabs po qam   DRYSOL 20 % external solution Apply topically.   Iron-FA-B Cmp-C-Biot-Probiotic (FUSION PLUS) CAPS TAKE 1 CAPSULE BY ORAL ROUTE EVERY DAY BETWEEN MEALS   levothyroxine (SYNTHROID) 137 MCG tablet TAKE 1 TABLET (137 MCG TOTAL) BY MOUTH DAILY BEFORE BREAKFAST. MONDAY - FRIDAY   losartan (COZAAR) 50 MG tablet TAKE 1 TABLET BY MOUTH EVERY DAY   methocarbamol (ROBAXIN-750) 750 MG tablet Take 1 tablet (750 mg total) by mouth 3 (three) times daily.   simvastatin (ZOCOR)  80 MG tablet Take 1 tablet (80 mg total) by mouth every evening.   sodium fluoride (PREVIDENT 5000 PLUS) 1.1 % CREA dental cream Denta 5000 Plus 1.1 % cream  USE AS DIRECTED   terbinafine (LAMISIL) 250 MG tablet Take 1 tablet (250 mg total) by mouth daily.   tirzepatide (MOUNJARO) 15 MG/0.5ML Pen Inject 15 mg into the skin once a week.   No facility-administered encounter medications on file as of 03/07/2022.    Past Medical History:  Diagnosis Date   Anemia    Arthritis    Depression    Diabetes mellitus    Hyperlipidemia    Thyroid nodule     Past Surgical History:  Procedure Laterality Date   LAPAROSCOPIC GASTRIC BANDING  06/14/08   THYROIDECTOMY N/A 10/08/2014   Procedure: NEAR TOTAL THYROIDECTOMY;  Surgeon: Valarie Merino, MD;  Location: [facility];  Service: General;  Laterality: N/A;   UTERINE FIBROID EMBOLIZATION  10 yrs ago    Family History  Problem Relation Age of Onset   Hypertension Mother    Hyperlipidemia Mother    Hyperthyroidism Mother    Diabetes Father     Social History   Socioeconomic History   Marital status: Single    Spouse name: Not on file   Number of children: Not on file   Years of education: Not on  file   Highest education level: Not on file  Occupational History   Not on file  Tobacco Use   Smoking status: Never   Smokeless tobacco: Never  Vaping Use   Vaping Use: Never used  Substance and Sexual Activity   Alcohol use: No   Drug use: No   Sexual activity: Never    Comment: meno   Other Topics Concern   Not on file  Social History Narrative   Not on file   Social Determinants of Health   Financial Resource Strain: Low Risk  (08/04/2020)   Overall Financial Resource Strain (CARDIA)    Difficulty of Paying Living Expenses: Not hard at all  Food Insecurity: No Food Insecurity (08/04/2020)   Hunger Vital Sign    Worried About Running Out of Food in the Last Year: Never true    Ran Out of Food in the Last Year: Never true   Transportation Needs: No Transportation Needs (08/04/2020)   PRAPARE - Administrator, Civil ServiceTransportation    Lack of Transportation (Medical): No    Lack of Transportation (Non-Medical): No  Physical Activity: Sufficiently Active (08/04/2020)   Exercise Vital Sign    Days of Exercise per Week: 4 days    Minutes of Exercise per Session: 40 min  Stress: No Stress Concern Present (08/04/2020)   [employer] - Occupational Stress Questionnaire    Feeling of Stress : Not at all  Social Connections: Not on file  Intimate Partner Violence: Not on file    Review of Systems  Constitutional:  Negative for chills and fever.  HENT:  Negative for congestion.   Eyes: Negative.   Respiratory:  Positive for cough. Negative for sputum production, shortness of breath and wheezing.   Cardiovascular:  Negative for chest pain.  Gastrointestinal:  Negative for abdominal pain, nausea and vomiting.  Genitourinary: Negative.   Musculoskeletal:  Negative for myalgias.  Skin: Negative.   Neurological:  Negative for headaches.  Endo/Heme/Allergies: Negative.   Psychiatric/Behavioral: Negative.          Objective    BP 111/71 (BP Location: Left Arm)   Pulse 92   Temp (!) 97.3 F (36.3 C)   Ht 5\' 8"  (1.727 m)   Wt 194 lb (88 kg)   BMI 29.50 kg/m   Physical Exam Vitals and nursing note reviewed.  Constitutional:      Appearance: Normal appearance.  HENT:     Head: Normocephalic and atraumatic.     Right Ear: Tympanic membrane, ear canal and external ear normal.     Left Ear: Tympanic membrane, ear canal and external ear normal.     Nose: Nose normal.     Right Sinus: No maxillary sinus tenderness or frontal sinus tenderness.     Left Sinus: No maxillary sinus tenderness or frontal sinus tenderness.     Mouth/Throat:     Mouth: Mucous membranes are moist.     Pharynx: Posterior oropharyngeal erythema present.     Tonsils: No tonsillar exudate.  Eyes:     Extraocular Movements:  Extraocular movements intact.     Conjunctiva/sclera: Conjunctivae normal.     Pupils: Pupils are equal, round, and reactive to light.  Cardiovascular:     Rate and Rhythm: Normal rate and regular rhythm.     Heart sounds: Normal heart sounds.  Pulmonary:     Effort: Pulmonary effort is normal.     Breath sounds: Normal breath sounds. No wheezing.  Musculoskeletal:  General: Normal range of motion.     Cervical back: Normal range of motion and neck supple.  Lymphadenopathy:     Head:     Right side of head: Submandibular and tonsillar adenopathy present.     Left side of head: Submandibular and tonsillar adenopathy present.     Cervical: Cervical adenopathy present.  Skin:    General: Skin is warm and dry.  Neurological:     General: No focal deficit present.     Mental Status: She is alert.  Psychiatric:        Mood and Affect: Mood normal.        Behavior: Behavior normal.        Thought Content: Thought content normal.        Judgment: Judgment normal.       Assessment & Plan:   Problem List Items Addressed This Visit   None Visit Diagnoses     Acute bronchitis, bacterial    -  Primary   Relevant Medications   azithromycin (ZITHROMAX) 250 MG tablet   benzonatate (TESSALON) 100 MG capsule   HYDROcodone bit-homatropine (HYDROMET) 5-1.5 MG/5ML syrup   albuterol (VENTOLIN HFA) 108 (90 Base) MCG/ACT inhaler     1. Acute bronchitis, bacterial Trial azithromycin, continue Tessalon Perles, trial Hydromet, trial albuterol inhaler.  Check of [place] controlled substance registry appropriate.  Patient education given on supportive care, red flags given for prompt reevaluation. - azithromycin (ZITHROMAX) 250 MG tablet; Take 2 tabs PO day 1, then take 1 tab PO once daily  Dispense: 6 tablet; Refill: 0 - benzonatate (TESSALON) 100 MG capsule; Take 1 capsule (100 mg total) by mouth 2 (two) times daily as needed for cough.  Dispense: 20 capsule; Refill: 0 -  HYDROcodone bit-homatropine (HYDROMET) 5-1.5 MG/5ML syrup; Take 5 mLs by mouth every 6 (six) hours as needed for cough.  Dispense: 120 mL; Refill: 0 - albuterol (VENTOLIN HFA) 108 (90 Base) MCG/ACT inhaler; Inhale 2 puffs into the lungs every 6 (six) hours as needed for wheezing or shortness of breath.  Dispense: 8 g; Refill: 0   I have reviewed the patient's medical history (PMH, PSH, Social History, Family History, Medications, and allergies) , and have been updated if relevant. I spent 28 minutes reviewing chart and  face to face time with patient.   Return if symptoms worsen or fail to improve.   Kasandra Knudsen Mayers, PA-C

## 2022-03-08 ENCOUNTER — Telehealth: Payer: Medicare PPO | Admitting: Internal Medicine

## 2022-03-08 MED ORDER — ALBUTEROL SULFATE HFA 108 (90 BASE) MCG/ACT IN AERS: 2.0000 | INHALATION_SPRAY | Freq: Four times a day (QID) | RESPIRATORY_TRACT | 0 refills | Status: DC | PRN

## 2022-03-08 MED ORDER — HYDROCODONE BIT-HOMATROP MBR 5-1.5 MG/5ML PO SOLN: 5.0000 mL | Freq: Four times a day (QID) | ORAL | 0 refills | Status: DC | PRN

## 2022-03-14 ENCOUNTER — Other Ambulatory Visit: Payer: Medicare PPO

## 2022-03-14 ENCOUNTER — Ambulatory Visit: Payer: Medicare PPO | Admitting: Internal Medicine

## 2022-03-19 ENCOUNTER — Encounter: Payer: Self-pay | Admitting: Internal Medicine

## 2022-03-19 ENCOUNTER — Ambulatory Visit: Payer: Medicare PPO | Admitting: Internal Medicine

## 2022-03-19 VITALS — BP 116/82 | HR 88 | Temp 98.0°F | Ht 68.0 in | Wt 196.0 lb

## 2022-03-19 DIAGNOSIS — E1169 Type 2 diabetes mellitus with other specified complication: Secondary | ICD-10-CM

## 2022-03-19 DIAGNOSIS — Z6829 Body mass index (BMI) 29.0-29.9, adult: Secondary | ICD-10-CM | POA: Diagnosis not present

## 2022-03-19 DIAGNOSIS — E663 Overweight: Secondary | ICD-10-CM | POA: Insufficient documentation

## 2022-03-19 DIAGNOSIS — R0982 Postnasal drip: Secondary | ICD-10-CM

## 2022-03-19 DIAGNOSIS — J309 Allergic rhinitis, unspecified: Secondary | ICD-10-CM | POA: Diagnosis not present

## 2022-03-19 DIAGNOSIS — E785 Hyperlipidemia, unspecified: Secondary | ICD-10-CM | POA: Diagnosis not present

## 2022-03-19 MED ORDER — HYDROCODONE BIT-HOMATROP MBR 5-1.5 MG/5ML PO SOLN: 5.0000 mL | Freq: Four times a day (QID) | ORAL | 0 refills | Status: DC | PRN

## 2022-03-19 MED ORDER — ATORVASTATIN CALCIUM 40 MG PO TABS: 40.0000 mg | ORAL_TABLET | Freq: Every day | ORAL | 1 refills | Status: DC

## 2022-03-19 MED ORDER — MOUNJARO 15 MG/0.5ML ~~LOC~~ SOAJ
15.0000 mg | SUBCUTANEOUS | 1 refills | Status: DC
Start: 2022-03-19 — End: 2022-03-23

## 2022-03-19 MED ORDER — ALBUTEROL SULFATE HFA 108 (90 BASE) MCG/ACT IN AERS: 2.0000 | INHALATION_SPRAY | Freq: Four times a day (QID) | RESPIRATORY_TRACT | 1 refills | Status: DC | PRN

## 2022-03-20 ENCOUNTER — Encounter: Payer: Self-pay | Admitting: Internal Medicine

## 2022-03-20 LAB — CMP14+EGFR
ALT: 19 IU/L (ref 0–32)
AST: 13 IU/L (ref 0–40)
Albumin/Globulin Ratio: 1.8 (ref 1.2–2.2)
Albumin: 4.3 g/dL (ref 3.8–4.8)
Alkaline Phosphatase: 50 IU/L (ref 44–121)
BUN/Creatinine Ratio: 13 (ref 12–28)
BUN: 10 mg/dL (ref 8–27)
Bilirubin Total: 0.5 mg/dL (ref 0.0–1.2)
CO2: 22 mmol/L (ref 20–29)
Calcium: 9.5 mg/dL (ref 8.7–10.3)
Chloride: 104 mmol/L (ref 96–106)
Creatinine, Ser: 0.76 mg/dL (ref 0.57–1.00)
Globulin, Total: 2.4 g/dL (ref 1.5–4.5)
Glucose: 101 mg/dL — ABNORMAL HIGH (ref 70–99)
Potassium: 4.5 mmol/L (ref 3.5–5.2)
Sodium: 141 mmol/L (ref 134–144)
Total Protein: 6.7 g/dL (ref 6.0–8.5)
eGFR: 86 mL/min/{1.73_m2} (ref >59)

## 2022-03-20 LAB — HEMOGLOBIN A1C
Est. average glucose Bld gHb Est-mCnc: 128 mg/dL
Hgb A1c MFr Bld: 6.1 % — ABNORMAL HIGH (ref 4.8–5.6)

## 2022-03-22 ENCOUNTER — Other Ambulatory Visit (HOSPITAL_COMMUNITY): Payer: Self-pay

## 2022-03-22 ENCOUNTER — Other Ambulatory Visit: Payer: Self-pay

## 2022-03-22 MED ORDER — MOUNJARO 12.5 MG/0.5ML ~~LOC~~ SOAJ
12.5000 mg | SUBCUTANEOUS | 0 refills | Status: DC
  Filled 2022-03-22: qty 2, 28d supply, fill #0
  Filled 2022-04-06 – 2022-04-23 (×3): qty 2, 28d supply, fill #1

## 2022-03-23 ENCOUNTER — Other Ambulatory Visit (HOSPITAL_COMMUNITY): Payer: Self-pay

## 2022-03-23 MED ORDER — MOUNJARO 15 MG/0.5ML ~~LOC~~ SOAJ
15.0000 mg | SUBCUTANEOUS | 1 refills | Status: DC
Start: 2022-03-19 — End: 2022-07-24
  Filled 2022-05-17: qty 2, 28d supply, fill #0

## 2022-03-23 MED ORDER — MOUNJARO 12.5 MG/0.5ML ~~LOC~~ SOAJ
12.5000 mg | SUBCUTANEOUS | 2 refills | Status: DC
Start: 2021-12-21 — End: 2022-04-02

## 2022-03-31 ENCOUNTER — Other Ambulatory Visit: Payer: Self-pay | Admitting: Internal Medicine

## 2022-04-02 ENCOUNTER — Other Ambulatory Visit: Payer: Self-pay

## 2022-04-02 ENCOUNTER — Inpatient Hospital Stay: Payer: Medicare PPO | Attending: Internal Medicine

## 2022-04-02 ENCOUNTER — Inpatient Hospital Stay: Payer: Medicare PPO | Admitting: Internal Medicine

## 2022-04-02 VITALS — BP 110/75 | HR 76 | Temp 97.8°F | Resp 16 | Wt 196.2 lb

## 2022-04-02 DIAGNOSIS — D72819 Decreased white blood cell count, unspecified: Secondary | ICD-10-CM | POA: Insufficient documentation

## 2022-04-02 DIAGNOSIS — D509 Iron deficiency anemia, unspecified: Secondary | ICD-10-CM | POA: Insufficient documentation

## 2022-04-02 DIAGNOSIS — Z79899 Other long term (current) drug therapy: Secondary | ICD-10-CM | POA: Insufficient documentation

## 2022-04-02 DIAGNOSIS — E785 Hyperlipidemia, unspecified: Secondary | ICD-10-CM | POA: Diagnosis not present

## 2022-04-02 DIAGNOSIS — D508 Other iron deficiency anemias: Secondary | ICD-10-CM

## 2022-04-02 DIAGNOSIS — E119 Type 2 diabetes mellitus without complications: Secondary | ICD-10-CM | POA: Insufficient documentation

## 2022-04-02 DIAGNOSIS — Z79890 Hormone replacement therapy (postmenopausal): Secondary | ICD-10-CM | POA: Insufficient documentation

## 2022-04-02 DIAGNOSIS — Z880 Allergy status to penicillin: Secondary | ICD-10-CM | POA: Diagnosis not present

## 2022-04-02 LAB — CBC WITH DIFFERENTIAL (CANCER CENTER ONLY)
Abs Immature Granulocytes: 0.01 10*3/uL (ref 0.00–0.07)
Basophils Absolute: 0 10*3/uL (ref 0.0–0.1)
Basophils Relative: 1 %
Eosinophils Absolute: 0.1 10*3/uL (ref 0.0–0.5)
Eosinophils Relative: 2 %
HCT: 37.6 % (ref 36.0–46.0)
Hemoglobin: 11.4 g/dL — ABNORMAL LOW (ref 12.0–15.0)
Immature Granulocytes: 0 %
Lymphocytes Relative: 22 %
Lymphs Abs: 0.7 10*3/uL (ref 0.7–4.0)
MCH: 21.3 pg — ABNORMAL LOW (ref 26.0–34.0)
MCHC: 30.3 g/dL (ref 30.0–36.0)
MCV: 70.3 fL — ABNORMAL LOW (ref 80.0–100.0)
Monocytes Absolute: 0.3 10*3/uL (ref 0.1–1.0)
Monocytes Relative: 9 %
Neutro Abs: 2.3 10*3/uL (ref 1.7–7.7)
Neutrophils Relative %: 66 %
Platelet Count: 262 10*3/uL (ref 150–400)
RBC: 5.35 MIL/uL — ABNORMAL HIGH (ref 3.87–5.11)
RDW: 17 % — ABNORMAL HIGH (ref 11.5–15.5)
WBC Count: 3.4 10*3/uL — ABNORMAL LOW (ref 4.0–10.5)
nRBC: 0 % (ref 0.0–0.2)

## 2022-04-02 LAB — LACTATE DEHYDROGENASE: LDH: 127 U/L (ref 98–192)

## 2022-04-02 LAB — FERRITIN: Ferritin: 24 ng/mL (ref 11–307)

## 2022-04-02 NOTE — Progress Notes (Signed)
[facility] [phone]   [phone]  OFFICE PROGRESS NOTE  [name], MD [address] [place] [address]  DIAGNOSIS: Leukocytopenia and mild anemia.  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  PRIOR THERAPY: None  CURRENT THERAPY: Iron tablets with fusion +1 capsule p.o. daily  INTERVAL HISTORY: Felicia Garrett 66 y.o. female returns to the clinic today for follow-up visit.  The patient is feeling fine today with no concerning complaints.  She was hiking with a group recently.  She is enjoying her retirement.  She denied having any dizzy spells.  She has no nausea, vomiting, diarrhea or constipation.  She has no headache or visual changes.  She has been tolerating her treatment with fusion plus fairly well.  The patient is here today for evaluation and repeat blood work.  Her hemoglobin electrophoresis was unremarkable.  MEDICAL HISTORY: Past Medical History:  Diagnosis Date   Anemia    Arthritis    Depression    Diabetes mellitus    Hyperlipidemia    Thyroid nodule     ALLERGIES:  is allergic to penicillins.  MEDICATIONS:  Current Outpatient Medications  Medication Sig Dispense Refill   albuterol (VENTOLIN HFA) 108 (90 Base) MCG/ACT inhaler Inhale 2 puffs into the lungs every 6 (six) hours as needed for wheezing or shortness of breath. 18 g 1   atorvastatin (LIPITOR) 40 MG tablet Take 1 tablet (40 mg total) by mouth daily. 90 tablet 1   benzonatate (TESSALON) 100 MG capsule Take 1 capsule (100 mg total) by mouth 2 (two) times daily as needed for cough. 20 capsule 0   buPROPion (WELLBUTRIN XL) 150 MG 24 hr tablet TAKE 1 TABLET BY MOUTH EVERY DAY 90 tablet 2   Dapagliflozin-metFORMIN HCl ER (XIGDUO XR) 01-999 MG TB24 TAKE 2 TABLETS BY MOUTH EVERY MORNING 180 tablet 2   DRYSOL 20 %  external solution Apply topically.     HYDROcodone bit-homatropine (HYDROMET) 5-1.5 MG/5ML syrup Take 5 mLs by mouth every 6 (six) hours as needed for cough. 120 mL 0   Iron-FA-B Cmp-C-Biot-Probiotic (FUSION PLUS) CAPS TAKE 1 CAPSULE BY ORAL ROUTE EVERY DAY BETWEEN MEALS 30 capsule 1   levothyroxine (SYNTHROID) 137 MCG tablet TAKE 1 TABLET (137 MCG TOTAL) BY MOUTH DAILY BEFORE BREAKFAST. MONDAY - FRIDAY 80 tablet 2   losartan (COZAAR) 50 MG tablet TAKE 1 TABLET BY MOUTH EVERY DAY 90 tablet 1   methocarbamol (ROBAXIN-750) 750 MG tablet Take 1 tablet (750 mg total) by mouth 3 (three) times daily. 40 tablet 1   sodium fluoride (PREVIDENT 5000 PLUS) 1.1 % CREA dental cream Denta 5000 Plus 1.1 % cream  USE AS DIRECTED     tirzepatide (MOUNJARO) 12.5 MG/0.5ML Pen Inject 12.5 mg into the skin once a week. 4 mL 0   tirzepatide (MOUNJARO) 12.5 MG/0.5ML Pen Inject 12.5 mg into the skin once a week. 2 mL 2   tirzepatide (MOUNJARO) 15 MG/0.5ML Pen Inject 15 mg into the skin once a week. 6 mL 1   No current facility-administered medications for this visit.    SURGICAL HISTORY:  Past Surgical History:  Procedure Laterality Date   LAPAROSCOPIC GASTRIC BANDING  06/14/08   THYROIDECTOMY N/A 10/08/2014   Procedure: NEAR TOTAL THYROIDECTOMY;  Surgeon: Valarie Merino, MD;  Location: [facility];  Service: General;  Laterality:  N/A;   UTERINE FIBROID EMBOLIZATION  10 yrs ago    REVIEW OF SYSTEMS:  A comprehensive review of systems was negative.   PHYSICAL EXAMINATION: General appearance: alert, cooperative, and no distress Head: Normocephalic, without obvious abnormality, atraumatic Neck: no adenopathy, no JVD, supple, symmetrical, trachea midline, and thyroid not enlarged, symmetric, no tenderness/mass/nodules Lymph nodes: Cervical, supraclavicular, and axillary nodes normal. Resp: clear to auscultation bilaterally Back: symmetric, no curvature. ROM normal. No CVA tenderness. Cardio: regular rate and rhythm,  S1, S2 normal, no murmur, click, rub or gallop GI: soft, non-tender; bowel sounds normal; no masses,  no organomegaly Extremities: extremities normal, atraumatic, no cyanosis or edema  ECOG PERFORMANCE STATUS: 1 - Symptomatic but completely ambulatory  Blood pressure 110/75, pulse 76, temperature 97.8 F (36.6 C), resp. rate 16, weight 196 lb 3.2 oz (89 kg), SpO2 100 %.  LABORATORY DATA: Lab Results  Component Value Date   WBC 3.4 (L) 04/02/2022   HGB 11.4 (L) 04/02/2022   HCT 37.6 04/02/2022   MCV 70.3 (L) 04/02/2022   PLT 262 04/02/2022      Chemistry      Component Value Date/Time   NA 141 03/19/2022 1215   K 4.5 03/19/2022 1215   CL 104 03/19/2022 1215   CO2 22 03/19/2022 1215   BUN 10 03/19/2022 1215   CREATININE 0.76 03/19/2022 1215   CREATININE 0.78 09/14/2021 1059      Component Value Date/Time   CALCIUM 9.5 03/19/2022 1215   ALKPHOS 50 03/19/2022 1215   AST 13 03/19/2022 1215   AST 15 09/14/2021 1059   ALT 19 03/19/2022 1215   ALT 14 09/14/2021 1059   BILITOT 0.5 03/19/2022 1215   BILITOT 0.6 09/14/2021 1059       RADIOGRAPHIC STUDIES: No results found.  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  The patient is currently on iron tablet with fusion plus and tolerating it fairly well. The patient has no significant complaints today.  Her recent hemoglobin electrophoresis was unremarkable and that showed normal pattern. CBC today showed mild improvement in the total leukocyte count and the patient continues to have the persistent mild anemia. I recommended for her to continue her current treatment with fusion +1 capsule p.o. daily. I will see her back for follow-up visit in 1 year for evaluation and repeat blood work. She was advised to call immediately if she has any  other concerning symptoms in the interval. The patient voices understanding of current disease status and treatment options and is in agreement with the current care plan.  All questions were answered. The patient knows to call the clinic with any problems, questions or concerns. We can certainly see the patient much sooner if necessary.  The total time spent in the appointment was 20 minutes.  Disclaimer: This note was dictated with voice recognition software. Similar sounding words can inadvertently be transcribed and may not be corrected upon review.

## 2022-04-06 ENCOUNTER — Other Ambulatory Visit (HOSPITAL_COMMUNITY): Payer: Self-pay

## 2022-04-12 ENCOUNTER — Telehealth: Payer: Self-pay | Admitting: Internal Medicine

## 2022-04-12 NOTE — Telephone Encounter (Signed)
Called patient regarding upcoming appointments, left a voicemail. 

## 2022-04-23 ENCOUNTER — Other Ambulatory Visit (HOSPITAL_COMMUNITY): Payer: Self-pay

## 2022-04-23 ENCOUNTER — Ambulatory Visit: Payer: Medicare PPO | Admitting: Internal Medicine

## 2022-05-01 ENCOUNTER — Ambulatory Visit: Payer: Self-pay

## 2022-05-01 NOTE — Patient Outreach (Signed)
  Care Coordination   05/01/2022 Name: Felicia Garrett MRN: [id number] DOB: 08/03/1956   Care Coordination Outreach Attempts:  An unsuccessful telephone outreach was attempted today to offer the patient information about available care coordination services as a benefit of their health plan.   Follow Up Plan:  Additional outreach attempts will be made to offer the patient care coordination information and services.   Encounter Outcome:  No Answer  Care Coordination Interventions Activated:  No   Care Coordination Interventions:  No, not indicated    Bevelyn Ngo, BSW, CDP Social Worker, Certified Dementia Practitioner Care Coordination [phone]

## 2022-05-02 ENCOUNTER — Ambulatory Visit: Payer: Self-pay

## 2022-05-02 NOTE — Patient Outreach (Signed)
  Care Coordination   Initial Visit Note   05/02/2022 Name: Felicia Garrett MRN: [id number] DOB: [birth year]  Felicia Garrett is a 66 y.o. year old female who sees Felicia Peng, MD for primary care. I spoke with  Felicia Garrett by phone today  What matters to the patients health and wellness today?  Patient declined to participate in visit    Goals Addressed   None     SDOH assessments and interventions completed:  No     Care Coordination Interventions Activated:  No  Care Coordination Interventions:  No, not indicated   Follow up plan: No further intervention required.   Encounter Outcome:  Pt. Refused   Felicia Garrett, BSW, CDP Social Worker, Certified Dementia Practitioner Care Coordination [phone]

## 2022-05-17 ENCOUNTER — Other Ambulatory Visit (HOSPITAL_COMMUNITY): Payer: Self-pay

## 2022-05-17 ENCOUNTER — Other Ambulatory Visit: Payer: Self-pay | Admitting: Internal Medicine

## 2022-05-17 MED ORDER — MOUNJARO 12.5 MG/0.5ML ~~LOC~~ SOAJ
12.5000 mg | SUBCUTANEOUS | 0 refills | Status: DC
  Filled 2022-05-17: qty 4, 56d supply, fill #0
  Filled 2022-05-17: qty 2, 28d supply, fill #0

## 2022-05-22 ENCOUNTER — Other Ambulatory Visit (HOSPITAL_COMMUNITY): Payer: Self-pay

## 2022-06-11 ENCOUNTER — Other Ambulatory Visit: Payer: Self-pay | Admitting: Internal Medicine

## 2022-07-13 ENCOUNTER — Other Ambulatory Visit (HOSPITAL_COMMUNITY): Payer: Self-pay

## 2022-07-13 ENCOUNTER — Other Ambulatory Visit: Payer: Self-pay | Admitting: Internal Medicine

## 2022-07-13 DIAGNOSIS — Z23 Encounter for immunization: Secondary | ICD-10-CM | POA: Diagnosis not present

## 2022-07-13 MED ORDER — MOUNJARO 12.5 MG/0.5ML ~~LOC~~ SOAJ
12.5000 mg | SUBCUTANEOUS | 0 refills | Status: DC
  Filled 2022-07-13: qty 4, 56d supply, fill #0

## 2022-07-19 ENCOUNTER — Other Ambulatory Visit: Payer: Self-pay | Admitting: Internal Medicine

## 2022-07-19 ENCOUNTER — Other Ambulatory Visit: Payer: Self-pay

## 2022-07-20 DIAGNOSIS — Z1231 Encounter for screening mammogram for malignant neoplasm of breast: Secondary | ICD-10-CM | POA: Diagnosis not present

## 2022-07-20 LAB — HM MAMMOGRAPHY: HM Mammogram: NORMAL (ref 0–4)

## 2022-07-24 ENCOUNTER — Ambulatory Visit: Payer: Medicare PPO | Admitting: Internal Medicine

## 2022-07-24 ENCOUNTER — Other Ambulatory Visit (HOSPITAL_COMMUNITY): Payer: Self-pay

## 2022-07-24 ENCOUNTER — Encounter: Payer: Self-pay | Admitting: Internal Medicine

## 2022-07-24 VITALS — BP 120/76 | HR 84 | Temp 97.4°F | Ht 68.0 in | Wt 183.8 lb

## 2022-07-24 DIAGNOSIS — Z6827 Body mass index (BMI) 27.0-27.9, adult: Secondary | ICD-10-CM | POA: Diagnosis not present

## 2022-07-24 DIAGNOSIS — E785 Hyperlipidemia, unspecified: Secondary | ICD-10-CM

## 2022-07-24 DIAGNOSIS — E1169 Type 2 diabetes mellitus with other specified complication: Secondary | ICD-10-CM | POA: Diagnosis not present

## 2022-07-24 MED ORDER — MOUNJARO 12.5 MG/0.5ML ~~LOC~~ SOAJ
12.5000 mg | SUBCUTANEOUS | 0 refills | Status: DC
  Filled 2022-07-24: qty 6, 84d supply, fill #0
  Filled 2022-09-14: qty 4, 56d supply, fill #0
  Filled 2022-11-07: qty 2, 28d supply, fill #1

## 2022-07-24 NOTE — Progress Notes (Signed)
Rich Brave [place],acting as a Education administrator for Maximino Greenland, MD.,have documented all relevant documentation on the behalf of Maximino Greenland, MD,as directed by  Maximino Greenland, MD while in the presence of Maximino Greenland, MD.    Subjective:     Patient ID: Felicia Garrett , female    DOB: 04-13-1956 , 66 y.o.   MRN: [id number]   Chief Complaint  Patient presents with   Diabetes   Hyperlipidemia    HPI  Patient presents today for f/u diabetes.  She is doing well with Mounjaro 12.40m weekly. She has no specific concerns or complaints at this time.   Diabetes She presents for her follow-up diabetic visit. She has type 2 diabetes mellitus. Her disease course has been stable. There are no hypoglycemic associated symptoms. Pertinent negatives for diabetes include no polydipsia, no polyphagia and no polyuria. There are no hypoglycemic complications. Risk factors for coronary artery disease include diabetes mellitus, dyslipidemia, hypertension, obesity and post-menopausal. She participates in exercise every other day. Eye exam is current.     Past Medical History:  Diagnosis Date   Anemia    Arthritis    Depression    Diabetes mellitus    Hyperlipidemia    Thyroid nodule      Family History  Problem Relation Age of Onset   Hypertension Mother    Hyperlipidemia Mother    Hyperthyroidism Mother    Diabetes Father      Current Outpatient Medications:    atorvastatin (LIPITOR) 40 MG tablet, Take 1 tablet (40 mg total) by mouth daily., Disp: 90 tablet, Rfl: 1   buPROPion (WELLBUTRIN XL) 150 MG 24 hr tablet, TAKE 1 TABLET BY MOUTH EVERY DAY, Disp: 90 tablet, Rfl: 2   Dapagliflozin-metFORMIN HCl ER (XIGDUO XR) 01-999 MG TB24, TAKE 2 TABLETS BY MOUTH EVERY MORNING, Disp: 180 tablet, Rfl: 2   DRYSOL 20 % external solution, Apply topically., Disp: , Rfl:    Iron-FA-B Cmp-C-Biot-Probiotic (FUSION PLUS) CAPS, TAKE 1 CAPSULE BY ORAL ROUTE EVERY DAY BETWEEN MEALS, Disp: 30 capsule, Rfl: 1    levothyroxine (SYNTHROID) 137 MCG tablet, TAKE 1 TABLET (137 MCG TOTAL) BY MOUTH DAILY BEFORE BREAKFAST. MONDAY - FRIDAY, Disp: 80 tablet, Rfl: 2   losartan (COZAAR) 50 MG tablet, TAKE 1 TABLET BY MOUTH EVERY DAY, Disp: 90 tablet, Rfl: 1   methocarbamol (ROBAXIN-750) 750 MG tablet, Take 1 tablet (750 mg total) by mouth 3 (three) times daily., Disp: 40 tablet, Rfl: 1   sodium fluoride (PREVIDENT 5000 PLUS) 1.1 % CREA dental cream, Denta 5000 Plus 1.1 % cream  USE AS DIRECTED, Disp: , Rfl:    tirzepatide (MOUNJARO) 12.5 MG/0.5ML Pen, Inject 12.5 mg into the skin once a week., Disp: 6 mL, Rfl: 0   Allergies  Allergen Reactions   Penicillins Shortness Of Breath    CHEST PAIN     Review of Systems  Constitutional: Negative.   Respiratory: Negative.    Cardiovascular: Negative.   Endocrine: Negative for polydipsia, polyphagia and polyuria.  Musculoskeletal: Negative.   Skin: Negative.   Neurological: Negative.   Psychiatric/Behavioral: Negative.       Today's Vitals   07/24/22 1058  BP: 120/76  Pulse: 84  Temp: (!) 97.4 F (36.3 C)  Weight: 183 lb 12.8 oz (83.4 kg)  Height: _0  (1.727 m)  PainSc: 0-No pain   Body mass index is 27.95 kg/m.  Wt Readings from Last 3 Encounters:  07/24/22 183 lb 12.8 oz (83.4 kg)  04/02/22  196 lb 3.2 oz (89 kg)  03/19/22 196 lb (88.9 kg)     Objective:  Physical Exam Vitals and nursing note reviewed.  Constitutional:      Appearance: Normal appearance.  HENT:     Head: Normocephalic and atraumatic.     Nose:     Comments: Masked     Mouth/Throat:     Comments: Masked  Eyes:     Extraocular Movements: Extraocular movements intact.  Cardiovascular:     Rate and Rhythm: Normal rate and regular rhythm.     Heart sounds: Normal heart sounds.  Pulmonary:     Effort: Pulmonary effort is normal.     Breath sounds: Normal breath sounds.  Musculoskeletal:     Cervical back: Normal range of motion.  Skin:    General: Skin is warm.   Neurological:     General: No focal deficit present.     Mental Status: She is alert.  Psychiatric:        Mood and Affect: Mood normal.        Behavior: Behavior normal.        Assessment And Plan:     1. Hyperlipidemia associated with type 2 diabetes mellitus ([place]) Comments: Chronic. She declined [facility] retinal scan, she cannot see ophthalmologist until 2024 unfortunately. She will c/w Mounjaro 12.31m weekly. She will f/u in 3-4 mos. - Hemoglobin A1c - BMP8+eGFR - Microalbumin / Creatinine Urine Ratio  2. BMI 27.0-27.9,adult Comments: She was congratulated on her 13lb weight loss since July 2023.   Patient was given opportunity to ask questions. Patient verbalized understanding of the plan and was able to repeat key elements of the plan. All questions were answered to their satisfaction.   I, RMaximino Greenland MD, have reviewed all documentation for this visit. The documentation on 07/24/22 for the exam, diagnosis, procedures, and orders are all accurate and complete.   IF YOU HAVE BEEN REFERRED TO A SPECIALIST, IT MAY TAKE 1-2 WEEKS TO SCHEDULE/PROCESS THE REFERRAL. IF YOU HAVE NOT HEARD FROM US/SPECIALIST IN TWO WEEKS, PLEASE GIVE [place] CALL AT [phone].   THE PATIENT IS ENCOURAGED TO PRACTICE SOCIAL DISTANCING DUE TO THE COVID-19 PANDEMIC.

## 2022-07-24 NOTE — Patient Instructions (Signed)

## 2022-07-25 LAB — MICROALBUMIN / CREATININE URINE RATIO
Creatinine, Urine: 212.9 mg/dL
Microalb/Creat Ratio: 6 mg/g creat (ref 0–29)
Microalbumin, Urine: 11.9 ug/mL

## 2022-07-25 LAB — BMP8+EGFR
BUN/Creatinine Ratio: 9 — ABNORMAL LOW (ref 12–28)
BUN: 7 mg/dL — ABNORMAL LOW (ref 8–27)
CO2: 24 mmol/L (ref 20–29)
Calcium: 9.3 mg/dL (ref 8.7–10.3)
Chloride: 101 mmol/L (ref 96–106)
Creatinine, Ser: 0.81 mg/dL (ref 0.57–1.00)
Glucose: 92 mg/dL (ref 70–99)
Potassium: 4.1 mmol/L (ref 3.5–5.2)
Sodium: 139 mmol/L (ref 134–144)
eGFR: 79 mL/min/{1.73_m2} (ref >59)

## 2022-07-25 LAB — HEMOGLOBIN A1C
Est. average glucose Bld gHb Est-mCnc: 128 mg/dL
Hgb A1c MFr Bld: 6.1 % — ABNORMAL HIGH (ref 4.8–5.6)

## 2022-07-31 ENCOUNTER — Telehealth: Payer: Self-pay | Admitting: Internal Medicine

## 2022-07-31 NOTE — Telephone Encounter (Signed)
Left message for patient to call back and schedule Medicare Annual Wellness Visit (AWV) either virtually or in office. Left  my Herbie Drape number [phone]   Last AWV 08/04/20 please schedule with Nurse Health Adviser   45 min for awv-i and in office appointments 30 min for awv-s  phone/virtual appointments

## 2022-08-08 ENCOUNTER — Other Ambulatory Visit: Payer: Self-pay | Admitting: Internal Medicine

## 2022-09-12 DIAGNOSIS — L987 Excessive and redundant skin and subcutaneous tissue: Secondary | ICD-10-CM | POA: Diagnosis not present

## 2022-09-12 DIAGNOSIS — Z4651 Encounter for fitting and adjustment of gastric lap band: Secondary | ICD-10-CM | POA: Diagnosis not present

## 2022-09-14 ENCOUNTER — Other Ambulatory Visit (HOSPITAL_COMMUNITY): Payer: Self-pay

## 2022-10-10 ENCOUNTER — Ambulatory Visit (INDEPENDENT_AMBULATORY_CARE_PROVIDER_SITE_OTHER): Payer: Medicare PPO

## 2022-10-10 VITALS — Ht 69.0 in | Wt 180.0 lb

## 2022-10-10 DIAGNOSIS — Z0000 Encounter for general adult medical examination without abnormal findings: Secondary | ICD-10-CM | POA: Diagnosis not present

## 2022-10-10 NOTE — Progress Notes (Signed)
I connected with Nathali Vent today by telephone and verified that I am speaking with the correct person using two identifiers. Location patient: home Location provider: work Persons participating in the virtual visit: Masayo Ivery, Michalski LPN.   I discussed the limitations, risks, security and privacy concerns of performing an evaluation and management service by telephone and the availability of in person appointments. I also discussed with the patient that there may be a patient responsible charge related to this service. The patient expressed understanding and verbally consented to this telephonic visit.    Interactive audio and video telecommunications were attempted between this provider and patient, however failed, due to patient having technical difficulties OR patient did not have access to video capability.  We continued and completed visit with audio only.     Vital signs may be patient reported or missing.  Subjective:   Nakyia Garrett is a 67 y.o. female who presents for Medicare Annual (Subsequent) preventive examination.  Review of Systems     Cardiac Risk Factors include: advanced age (>37men, >76 women);diabetes mellitus;dyslipidemia     Objective:    Today's Vitals   10/10/22 1145  Weight: 180 lb (81.6 kg)  Height: 5\' 9"  (1.753 m)   Body mass index is 26.58 kg/m.     10/10/2022   11:50 AM 04/02/2022   11:10 AM 08/04/2020    3:29 PM 10/08/2014    3:24 PM 10/05/2014    1:10 PM  Advanced Directives  Does Patient Have a Medical Advance Directive? No No No Yes No  Type of Advance Directive    Living will   Does patient want to make changes to medical advance directive?    No - Patient declined   Copy of Healthcare Power of Attorney in Chart?    No - copy requested   Would patient like information on creating a medical advance directive?  No - Patient declined Yes (MAU/Ambulatory/Procedural Areas - Information given)  Yes - Educational materials given     Current Medications (verified) Outpatient Encounter Medications as of 10/10/2022  Medication Sig   atorvastatin (LIPITOR) 40 MG tablet Take 1 tablet (40 mg total) by mouth daily.   buPROPion (WELLBUTRIN XL) 150 MG 24 hr tablet TAKE 1 TABLET BY MOUTH EVERY DAY   Dapagliflozin-metFORMIN HCl ER (XIGDUO XR) 01-999 MG TB24 TAKE 2 TABLETS BY MOUTH EVERY MORNING   DRYSOL 20 % external solution Apply topically.   Iron-FA-B Cmp-C-Biot-Probiotic (FUSION PLUS) CAPS TAKE 1 CAPSULE BY ORAL ROUTE EVERY DAY BETWEEN MEALS   levothyroxine (SYNTHROID) 137 MCG tablet TAKE 1 TABLET (137 MCG TOTAL) BY MOUTH DAILY BEFORE BREAKFAST. MONDAY - FRIDAY   losartan (COZAAR) 50 MG tablet TAKE 1 TABLET BY MOUTH EVERY DAY   methocarbamol (ROBAXIN) 750 MG tablet TAKE 1 TABLET BY MOUTH 3 TIMES DAILY.   sodium fluoride (PREVIDENT 5000 PLUS) 1.1 % CREA dental cream Denta 5000 Plus 1.1 % cream  USE AS DIRECTED   tirzepatide (MOUNJARO) 12.5 MG/0.5ML Pen Inject 12.5 mg into the skin once a week.   No facility-administered encounter medications on file as of 10/10/2022.    Allergies (verified) Penicillins   History: Past Medical History:  Diagnosis Date   Anemia    Arthritis    Depression    Diabetes mellitus    Hyperlipidemia    Thyroid nodule    Past Surgical History:  Procedure Laterality Date   LAPAROSCOPIC GASTRIC BANDING  06/14/08   THYROIDECTOMY N/A 10/08/2014   Procedure: NEAR TOTAL THYROIDECTOMY;  Surgeon: Pedro Earls, MD;  Location: [facility];  Service: General;  Laterality: N/A;   UTERINE FIBROID EMBOLIZATION  10 yrs ago   Family History  Problem Relation Age of Onset   Hypertension Mother    Hyperlipidemia Mother    Hyperthyroidism Mother    Diabetes Father    Social History   Socioeconomic History   Marital status: Single    Spouse name: Not on file   Number of children: Not on file   Years of education: Not on file   Highest education level: Not on file  Occupational History   Not on  file  Tobacco Use   Smoking status: Never   Smokeless tobacco: Never  Vaping Use   Vaping Use: Never used  Substance and Sexual Activity   Alcohol use: No   Drug use: No   Sexual activity: Never    Comment: meno   Other Topics Concern   Not on file  Social History Narrative   Not on file   Social Determinants of Health   Financial Resource Strain: Low Risk  (10/10/2022)   Overall Financial Resource Strain (CARDIA)    Difficulty of Paying Living Expenses: Not hard at all  Food Insecurity: No Food Insecurity (10/10/2022)   Hunger Vital Sign    Worried About Running Out of Food in the Last Year: Never true    [place] in the Last Year: Never true  Transportation Needs: No Transportation Needs (10/10/2022)   PRAPARE - Hydrologist (Medical): No    Lack of Transportation (Non-Medical): No  Physical Activity: Sufficiently Active (10/10/2022)   Exercise Vital Sign    Days of Exercise per Week: 3 days    Minutes of Exercise per Session: 60 min  Stress: No Stress Concern Present (10/10/2022)   [place]    Feeling of Stress : Not at all  Social Connections: Not on file    Tobacco Counseling Counseling given: Not Answered   Clinical Intake:  Pre-visit preparation completed: Yes  Pain : No/denies pain     Nutritional Status: BMI 25 -29 Overweight Nutritional Risks: None Diabetes: Yes  How often do you need to have someone help you when you read instructions, pamphlets, or other written materials from your doctor or pharmacy?: 1 - Never  Diabetic? Yes Nutrition Risk Assessment:  Has the patient had any N/V/D within the last 2 months?  No  Does the patient have any non-healing wounds?  No  Has the patient had any unintentional weight loss or weight gain?  No   Diabetes:  Is the patient diabetic?  Yes  If diabetic, was a CBG obtained today?  No  Did the patient bring  in their glucometer from home?  No  How often do you monitor your CBG's? Does not.   Financial Strains and Diabetes Management:  Are you having any financial strains with the device, your supplies or your medication? No .  Does the patient want to be seen by Chronic Care Management for management of their diabetes?  No  Would the patient like to be referred to a Nutritionist or for Diabetic Management?  No   Diabetic Exams:  Diabetic Eye Exam: Overdue for diabetic eye exam. Pt has been advised about the importance in completing this exam. Patient advised to call and schedule an eye exam. Diabetic Foot Exam: Overdue, Pt has been advised about the importance in completing  this exam. Pt is scheduled for diabetic foot exam on next appointment.   Interpreter Needed?: No  Information entered by :: NAllen LPN   Activities of Daily Living    10/10/2022   11:51 AM  In your present state of health, do you have any difficulty performing the following activities:  Hearing? 0  Vision? 0  Difficulty concentrating or making decisions? 0  Walking or climbing stairs? 0  Dressing or bathing? 0  Doing errands, shopping? 0  Preparing Food and eating ? N  Using the Toilet? N  In the past six months, have you accidently leaked urine? N  Do you have problems with loss of bowel control? N  Managing your Medications? N  Managing your Finances? N  Housekeeping or managing your Housekeeping? N    Patient Care Team: [name], MD as PCP - General (Internal Medicine) Liberty Handy, NP as Referring Physician (Nurse Practitioner)  Indicate any recent Medical Services you may have received from other than [facility] providers in the past year (date may be approximate).     Assessment:   This is a routine wellness examination for [place]a.  Hearing/Vision screen Vision Screening - Comments:: Regular eye exams, [facility]  Dietary issues and exercise activities discussed: Current Exercise  Habits: Home exercise routine, Type of exercise: Other - see comments (hiking), Time (Minutes): 60, Frequency (Times/Week): 3, Weekly Exercise (Minutes/Week): 180   Goals Addressed             This Visit's Progress    Patient Stated       10/10/2022, wants to lose more weight and gain core strength       Depression Screen    10/10/2022   11:51 AM 08/23/2021    2:36 PM 08/04/2020    3:30 PM 08/04/2020    2:52 PM 05/28/2019   10:13 AM 10/29/2018    2:19 PM 07/02/2018   11:13 AM  PHQ 2/9 Scores  PHQ - 2 Score 0 0 0 0 0 0 0  PHQ- 9 Score  0         Fall Risk    10/10/2022   11:50 AM 08/23/2021    2:36 PM 08/04/2020    3:30 PM 05/28/2019   10:13 AM 04/15/2019   10:50 AM  Fall Risk   Falls in the past year? 1 0 0 0 0  Comment tripped hiking      Number falls in past yr: 0 0     Injury with Fall? 0 0     Risk for fall due to : Medication side effect  Medication side effect    Follow up Falls prevention discussed;Education provided;Falls evaluation completed  Falls evaluation completed;Education provided;Falls prevention discussed      FALL RISK PREVENTION PERTAINING TO THE HOME:  Any stairs in or around the home? Yes  If so, are there any without handrails? No  Home free of loose throw rugs in walkways, pet beds, electrical cords, etc? Yes  Adequate lighting in your home to reduce risk of falls? Yes   ASSISTIVE DEVICES UTILIZED TO PREVENT FALLS:  Life alert? No  Use of a cane, walker or w/c? No  Grab bars in the bathroom? No  Shower chair or bench in shower? No  Elevated toilet seat or a handicapped toilet? Yes   TIMED UP AND GO:  Was the test performed? No .      Cognitive Function:        10/10/2022  11:53 AM 08/04/2020    3:31 PM  6CIT Screen  What Year? 0 points 0 points  What month? 0 points 0 points  What time? 0 points 0 points  Count back from 20 0 points 0 points  Months in reverse 0 points 0 points  Repeat phrase 6 points 0 points  Total Score  6 points 0 points    Immunizations Immunization History  Administered Date(s) Administered   COVID-19, mRNA, vaccine(Comirnaty)12 years and older 07/13/2022   Influenza Inj Mdck Quad Pf 06/06/2018   Influenza, High Dose Seasonal PF 07/04/2019, 06/16/2020, 06/14/2021   Influenza,inj,Quad PF,6+ Mos 06/30/2017, 07/03/2020   Influenza-Unspecified 06/16/2018, 07/04/2019, 06/01/2022   PFIZER Comirnaty(Gray Top)Covid-19 Tri-Sucrose Vaccine 02/21/2021   PFIZER(Purple Top)SARS-COV-2 Vaccination 10/13/2019, 11/03/2019, 06/20/2020, 06/14/2021   PNEUMOCOCCAL CONJUGATE-20 04/27/2021   PPD Test 10/22/2019   Pfizer Covid-19 Vaccine Bivalent Booster 64yrs & up 12/22/2020   Tdap 10/09/2017   Zoster Recombinat (Shingrix) 06/27/2018, 10/29/2018, 07/04/2019   Zoster, Live 12/10/2012, 06/27/2018    TDAP status: Up to date  Flu Vaccine status: Up to date  Pneumococcal vaccine status: Up to date  Covid-19 vaccine status: Completed vaccines  Qualifies for Shingles Vaccine? Yes   Zostavax completed Yes   Shingrix Completed?: Yes  Screening Tests Health Maintenance  Topic Date Due   Medicare Annual Wellness (AWV)  08/04/2021   OPHTHALMOLOGY EXAM  10/04/2021   FOOT EXAM  08/23/2022   HEMOGLOBIN A1C  01/22/2023   MAMMOGRAM  07/21/2023   Diabetic kidney evaluation - eGFR measurement  07/25/2023   Diabetic kidney evaluation - Urine ACR  07/25/2023   COLONOSCOPY (Pts 45-48yrs Insurance coverage will need to be confirmed)  02/21/2027   DTaP/Tdap/Td (2 - Td or Tdap) 10/10/2027   Pneumonia Vaccine 80+ Years old  Completed   INFLUENZA VACCINE  Completed   DEXA SCAN  Completed   COVID-19 Vaccine  Completed   Hepatitis C Screening  Completed   Zoster Vaccines- Shingrix  Completed   HPV VACCINES  Aged Out    Health Maintenance  Health Maintenance Due  Topic Date Due   Medicare Annual Wellness (AWV)  08/04/2021   OPHTHALMOLOGY EXAM  10/04/2021   FOOT EXAM  08/23/2022    Colorectal cancer  screening: Type of screening: Colonoscopy. Completed 02/20/2017. Repeat every 8 years  Mammogram status: Completed 07/20/2022. Repeat every year  Bone Density status: Completed 09/25/2018.   Lung Cancer Screening: (Low Dose CT Chest recommended if Age 59-80 years, 30 pack-year currently smoking OR have quit w/in 15years.) does not qualify.   Lung Cancer Screening Referral: no  Additional Screening:  Hepatitis C Screening: does qualify; Completed 09/14/2021  Vision Screening: Recommended annual ophthalmology exams for early detection of glaucoma and other disorders of the eye. Is the patient up to date with their annual eye exam?  Yes  Who is the provider or what is the name of the office in which the patient attends annual eye exams? [facility] If pt is not established with a provider, would they like to be referred to a provider to establish care? No .   Dental Screening: Recommended annual dental exams for proper oral hygiene  Community Resource Referral / Chronic Care Management: CRR required this visit?  No   CCM required this visit?  No      Plan:     I have personally reviewed and noted the following in the patient's chart:   Medical and social history Use of alcohol, tobacco or illicit drugs  Current medications and supplements including opioid prescriptions. Patient is not currently taking opioid prescriptions. Functional ability and status Nutritional status Physical activity Advanced directives List of other physicians Hospitalizations, surgeries, and ER visits in previous 12 months Vitals Screenings to include cognitive, depression, and falls Referrals and appointments  In addition, I have reviewed and discussed with patient certain preventive protocols, quality metrics, and best practice recommendations. A written personalized care plan for preventive services as well as general preventive health recommendations were provided to patient.     Barb Merino, LPN   [id number]   Nurse Notes: none  Due to this being a virtual visit, the after visit summary with patients personalized plan was offered to patient via mail or my-chart. Patient would like to access on my-chart

## 2022-10-10 NOTE — Patient Instructions (Signed)
Ms. Felicia Garrett , Thank you for taking time to come for your Medicare Wellness Visit. I appreciate your ongoing commitment to your health goals. Please review the following plan we discussed and let me know if I can assist you in the future.   These are the goals we discussed:  Goals      Patient Stated     08/04/2020, drink more water, start hiking again     Patient Stated     10/10/2022, wants to lose more weight and gain core strength        This is a list of the screening recommended for you and due dates:  Health Maintenance  Topic Date Due   Eye exam for diabetics  10/04/2021   Complete foot exam   08/23/2022   Hemoglobin A1C  01/22/2023   Mammogram  07/21/2023   Yearly kidney function blood test for diabetes  07/25/2023   Yearly kidney health urinalysis for diabetes  07/25/2023   Medicare Annual Wellness Visit  10/11/2023   Colon Cancer Screening  02/21/2027   DTaP/Tdap/Td vaccine (2 - Td or Tdap) 10/10/2027   Pneumonia Vaccine  Completed   Flu Shot  Completed   DEXA scan (bone density measurement)  Completed   COVID-19 Vaccine  Completed   Hepatitis C Screening: USPSTF Recommendation to screen - Ages 37-79 yo.  Completed   Zoster (Shingles) Vaccine  Completed   HPV Vaccine  Aged Out    Advanced directives: Advance directive discussed with you today.   Conditions/risks identified: none  Next appointment: Follow up in one year for your annual wellness visit    Preventive Care 65 Years and Older, Female Preventive care refers to lifestyle choices and visits with your health care provider that can promote health and wellness. What does preventive care include? A yearly physical exam. This is also called an annual well check. Dental exams once or twice a year. Routine eye exams. Ask your health care provider how often you should have your eyes checked. Personal lifestyle choices, including: Daily care of your teeth and gums. Regular physical activity. Eating a healthy  diet. Avoiding tobacco and drug use. Limiting alcohol use. Practicing safe sex. Taking low-dose aspirin every day. Taking vitamin and mineral supplements as recommended by your health care provider. What happens during an annual well check? The services and screenings done by your health care provider during your annual well check will depend on your age, overall health, lifestyle risk factors, and family history of disease. Counseling  Your health care provider may ask you questions about your: Alcohol use. Tobacco use. Drug use. Emotional well-being. Home and relationship well-being. Sexual activity. Eating habits. History of falls. Memory and ability to understand (cognition). Work and work Statistician. Reproductive health. Screening  You may have the following tests or measurements: Height, weight, and BMI. Blood pressure. Lipid and cholesterol levels. These may be checked every 5 years, or more frequently if you are over 70 years old. Skin check. Lung cancer screening. You may have this screening every year starting at age 2 if you have a 30-pack-year history of smoking and currently smoke or have quit within the past 15 years. Fecal occult blood test (FOBT) of the stool. You may have this test every year starting at age 34. Flexible sigmoidoscopy or colonoscopy. You may have a sigmoidoscopy every 5 years or a colonoscopy every 10 years starting at age 47. Hepatitis C blood test. Hepatitis B blood test. Sexually transmitted disease (STD) testing. Diabetes screening.  This is done by checking your blood sugar (glucose) after you have not eaten for a while (fasting). You may have this done every 1-3 years. Bone density scan. This is done to screen for osteoporosis. You may have this done starting at age 31. Mammogram. This may be done every 1-2 years. Talk to your health care provider about how often you should have regular mammograms. Talk with your health care provider about  your test results, treatment options, and if necessary, the need for more tests. Vaccines  Your health care provider may recommend certain vaccines, such as: Influenza vaccine. This is recommended every year. Tetanus, diphtheria, and acellular pertussis (Tdap, Td) vaccine. You may need a Td booster every 10 years. Zoster vaccine. You may need this after age 88. Pneumococcal 13-valent conjugate (PCV13) vaccine. One dose is recommended after age 14. Pneumococcal polysaccharide (PPSV23) vaccine. One dose is recommended after age 71. Talk to your health care provider about which screenings and vaccines you need and how often you need them. This information is not intended to replace advice given to you by your health care provider. Make sure you discuss any questions you have with your health care provider. Document Released: 10/07/2015 Document Revised: 05/30/2016 Document Reviewed: 07/12/2015 Elsevier Interactive Patient Education  2017 [place] Prevention in the Home Falls can cause injuries. They can happen to people of all ages. There are many things you can do to make your home safe and to help prevent falls. What can I do on the outside of my home? Regularly fix the edges of walkways and driveways and fix any cracks. Remove anything that might make you trip as you walk through a door, such as a raised step or threshold. Trim any bushes or trees on the path to your home. Use bright outdoor lighting. Clear any walking paths of anything that might make someone trip, such as rocks or tools. Regularly check to see if handrails are loose or broken. Make sure that both sides of any steps have handrails. Any raised decks and porches should have guardrails on the edges. Have any leaves, snow, or ice cleared regularly. Use sand or salt on walking paths during winter. Clean up any spills in your garage right away. This includes oil or grease spills. What can I do in the bathroom? Use  night lights. Install grab bars by the toilet and in the tub and shower. Do not use towel bars as grab bars. Use non-skid mats or decals in the tub or shower. If you need to sit down in the shower, use a plastic, non-slip stool. Keep the floor dry. Clean up any water that spills on the floor as soon as it happens. Remove soap buildup in the tub or shower regularly. Attach bath mats securely with double-sided non-slip rug tape. Do not have throw rugs and other things on the floor that can make you trip. What can I do in the bedroom? Use night lights. Make sure that you have a light by your bed that is easy to reach. Do not use any sheets or blankets that are too big for your bed. They should not hang down onto the floor. Have a firm chair that has side arms. You can use this for support while you get dressed. Do not have throw rugs and other things on the floor that can make you trip. What can I do in the kitchen? Clean up any spills right away. Avoid walking on wet floors. Keep items  that you use a lot in easy-to-reach places. If you need to reach something above you, use a strong step stool that has a grab bar. Keep electrical cords out of the way. Do not use floor polish or wax that makes floors slippery. If you must use wax, use non-skid floor wax. Do not have throw rugs and other things on the floor that can make you trip. What can I do with my stairs? Do not leave any items on the stairs. Make sure that there are handrails on both sides of the stairs and use them. Fix handrails that are broken or loose. Make sure that handrails are as long as the stairways. Check any carpeting to make sure that it is firmly attached to the stairs. Fix any carpet that is loose or worn. Avoid having throw rugs at the top or bottom of the stairs. If you do have throw rugs, attach them to the floor with carpet tape. Make sure that you have a light switch at the top of the stairs and the bottom of the  stairs. If you do not have them, ask someone to add them for you. What else can I do to help prevent falls? Wear shoes that: Do not have high heels. Have rubber bottoms. Are comfortable and fit you well. Are closed at the toe. Do not wear sandals. If you use a stepladder: Make sure that it is fully opened. Do not climb a closed stepladder. Make sure that both sides of the stepladder are locked into place. Ask someone to hold it for you, if possible. Clearly mark and make sure that you can see: Any grab bars or handrails. First and last steps. Where the edge of each step is. Use tools that help you move around (mobility aids) if they are needed. These include: Canes. Walkers. Scooters. Crutches. Turn on the lights when you go into a dark area. Replace any light bulbs as soon as they burn out. Set up your furniture so you have a clear path. Avoid moving your furniture around. If any of your floors are uneven, fix them. If there are any pets around you, be aware of where they are. Review your medicines with your doctor. Some medicines can make you feel dizzy. This can increase your chance of falling. Ask your doctor what other things that you can do to help prevent falls. This information is not intended to replace advice given to you by your health care provider. Make sure you discuss any questions you have with your health care provider. Document Released: 07/07/2009 Document Revised: 02/16/2016 Document Reviewed: 10/15/2014 Elsevier Interactive Patient Education  2017 [employer].

## 2022-10-22 DIAGNOSIS — H2513 Age-related nuclear cataract, bilateral: Secondary | ICD-10-CM | POA: Diagnosis not present

## 2022-10-22 DIAGNOSIS — E119 Type 2 diabetes mellitus without complications: Secondary | ICD-10-CM | POA: Diagnosis not present

## 2022-10-22 LAB — HM DIABETES EYE EXAM

## 2022-10-29 DIAGNOSIS — H16223 Keratoconjunctivitis sicca, not specified as Sjogren's, bilateral: Secondary | ICD-10-CM | POA: Diagnosis not present

## 2022-11-01 DIAGNOSIS — Z124 Encounter for screening for malignant neoplasm of cervix: Secondary | ICD-10-CM | POA: Diagnosis not present

## 2022-11-01 DIAGNOSIS — N941 Unspecified dyspareunia: Secondary | ICD-10-CM | POA: Diagnosis not present

## 2022-11-01 DIAGNOSIS — N898 Other specified noninflammatory disorders of vagina: Secondary | ICD-10-CM | POA: Diagnosis not present

## 2022-11-07 ENCOUNTER — Other Ambulatory Visit (HOSPITAL_COMMUNITY): Payer: Self-pay

## 2022-11-07 ENCOUNTER — Other Ambulatory Visit: Payer: Self-pay

## 2022-11-07 MED ORDER — MOUNJARO 10 MG/0.5ML ~~LOC~~ SOAJ
10.0000 mg | SUBCUTANEOUS | 1 refills | Status: DC
  Filled 2022-11-07: qty 2, 28d supply, fill #0

## 2022-11-08 ENCOUNTER — Telehealth: Payer: Self-pay

## 2022-11-08 ENCOUNTER — Other Ambulatory Visit: Payer: Self-pay

## 2022-11-08 ENCOUNTER — Other Ambulatory Visit (HOSPITAL_COMMUNITY): Payer: Self-pay

## 2022-11-08 MED ORDER — MOUNJARO 15 MG/0.5ML ~~LOC~~ SOAJ
15.0000 mg | SUBCUTANEOUS | 0 refills | Status: DC
  Filled 2022-11-08: qty 2, 28d supply, fill #0

## 2022-11-08 NOTE — Telephone Encounter (Signed)
Patient called requesting Mounjaro 55m to be sent to preferred local pharmacy. Patient notified pharmacies are currently out of 10, 15 & 12.5MG doses. Patient stated she would still like the 17msent, med sent on 2/14. Patient called today requesting [id number] the pharmacy has in stock. Being they do not have the 10MG, Provider notified, med sent.

## 2022-11-13 ENCOUNTER — Other Ambulatory Visit (HOSPITAL_COMMUNITY): Payer: Self-pay

## 2022-11-19 ENCOUNTER — Other Ambulatory Visit (HOSPITAL_COMMUNITY): Payer: Self-pay

## 2022-11-20 DIAGNOSIS — M67912 Unspecified disorder of synovium and tendon, left shoulder: Secondary | ICD-10-CM | POA: Diagnosis not present

## 2022-11-23 ENCOUNTER — Other Ambulatory Visit: Payer: Self-pay | Admitting: Internal Medicine

## 2022-11-26 ENCOUNTER — Encounter: Payer: Self-pay | Admitting: Internal Medicine

## 2022-11-26 ENCOUNTER — Other Ambulatory Visit (HOSPITAL_COMMUNITY): Payer: Self-pay

## 2022-11-26 ENCOUNTER — Ambulatory Visit: Payer: Medicare PPO | Admitting: Internal Medicine

## 2022-11-26 ENCOUNTER — Other Ambulatory Visit: Payer: Self-pay

## 2022-11-26 VITALS — BP 128/72 | HR 87 | Temp 98.2°F | Ht 69.0 in | Wt 174.8 lb

## 2022-11-26 DIAGNOSIS — E785 Hyperlipidemia, unspecified: Secondary | ICD-10-CM | POA: Diagnosis not present

## 2022-11-26 DIAGNOSIS — E1169 Type 2 diabetes mellitus with other specified complication: Secondary | ICD-10-CM

## 2022-11-26 DIAGNOSIS — E890 Postprocedural hypothyroidism: Secondary | ICD-10-CM | POA: Diagnosis not present

## 2022-11-26 DIAGNOSIS — I10 Essential (primary) hypertension: Secondary | ICD-10-CM | POA: Diagnosis not present

## 2022-11-26 DIAGNOSIS — Z6825 Body mass index (BMI) 25.0-25.9, adult: Secondary | ICD-10-CM | POA: Diagnosis not present

## 2022-11-26 MED ORDER — LEVOTHYROXINE SODIUM 137 MCG PO TABS: 137.0000 ug | ORAL_TABLET | Freq: Every day | ORAL | 2 refills | Status: DC

## 2022-11-26 MED ORDER — MOUNJARO 15 MG/0.5ML ~~LOC~~ SOAJ
15.0000 mg | SUBCUTANEOUS | 3 refills | Status: DC
  Filled 2022-11-26: qty 2, 28d supply, fill #0
  Filled 2022-12-25 – 2023-01-09 (×3): qty 2, 28d supply, fill #1
  Filled 2023-02-19: qty 2, 28d supply, fill #2
  Filled 2023-03-12 – 2023-04-11 (×2): qty 2, 28d supply, fill #3

## 2022-11-26 MED ORDER — DRYSOL 20 % EX SOLN: Freq: Every day | CUTANEOUS | 1 refills | Status: DC | PRN

## 2022-11-26 MED ORDER — MOUNJARO 12.5 MG/0.5ML ~~LOC~~ SOAJ
12.5000 mg | SUBCUTANEOUS | 0 refills | Status: DC
  Filled 2022-11-26 – 2023-01-04 (×2): qty 2, 28d supply, fill #0

## 2022-11-26 NOTE — Patient Instructions (Signed)

## 2022-11-26 NOTE — Progress Notes (Signed)
Barnet Glasgow Martin,acting as a Education administrator for Maximino Greenland, MD.,have documented all relevant documentation on the behalf of Maximino Greenland, MD,as directed by  Maximino Greenland, MD while in the presence of Maximino Greenland, MD.    Subjective:     Patient ID: Felicia Garrett , female    DOB: 03-03-1956 , 67 y.o.   MRN: [id number]   Chief Complaint  Patient presents with   Diabetes   Hyperlipidemia    HPI  She is here today for diabetes f/u.  She reports compliance with meds. She lives an active lifestyle. She saw Dr.Mickenly for L shoulder pain, she states he has giving her a steroid shot. Patient states she got her eye exam a few weeks ago, letter was sent for results.  BP Readings from Last 3 Encounters: 11/26/22 : 128/72 07/24/22 : 120/76 04/02/22 : 110/75    Diabetes She presents for her follow-up diabetic visit. She has type 2 diabetes mellitus. Her disease course has been stable. There are no hypoglycemic associated symptoms. There are no hypoglycemic complications. Risk factors for coronary artery disease include diabetes mellitus, dyslipidemia, hypertension, obesity and post-menopausal. She participates in exercise every other day. Eye exam is current.     Past Medical History:  Diagnosis Date   Anemia    Arthritis    Depression    Diabetes mellitus    Hyperlipidemia    Thyroid nodule      Family History  Problem Relation Age of Onset   Hypertension Mother    Hyperlipidemia Mother    Hyperthyroidism Mother    Diabetes Father      Current Outpatient Medications:    atorvastatin (LIPITOR) 40 MG tablet, Take 1 tablet (40 mg total) by mouth daily., Disp: 90 tablet, Rfl: 1   buPROPion (WELLBUTRIN XL) 150 MG 24 hr tablet, TAKE 1 TABLET BY MOUTH EVERY DAY, Disp: 90 tablet, Rfl: 2   Dapagliflozin-metFORMIN HCl ER (XIGDUO XR) 01-999 MG TB24, TAKE 2 TABLETS BY MOUTH EVERY MORNING, Disp: 180 tablet, Rfl: 2   Iron-FA-B Cmp-C-Biot-Probiotic (FUSION PLUS) CAPS, TAKE 1 CAPSULE BY  ORAL ROUTE EVERY DAY BETWEEN MEALS, Disp: 30 capsule, Rfl: 1   losartan (COZAAR) 50 MG tablet, TAKE 1 TABLET BY MOUTH EVERY DAY, Disp: 90 tablet, Rfl: 1   methocarbamol (ROBAXIN) 750 MG tablet, TAKE 1 TABLET BY MOUTH 3 TIMES DAILY., Disp: 40 tablet, Rfl: 1   sodium fluoride (PREVIDENT 5000 PLUS) 1.1 % CREA dental cream, Denta 5000 Plus 1.1 % cream  USE AS DIRECTED, Disp: , Rfl:    tirzepatide (MOUNJARO) 12.5 MG/0.5ML Pen, Inject 12.5 mg into the skin once a week., Disp: 2 mL, Rfl: 0   DRYSOL 20 % external solution, Apply topically daily as needed., Disp: 35 mL, Rfl: 1   levothyroxine (SYNTHROID) 137 MCG tablet, Take 1 tablet (137 mcg total) by mouth daily before breakfast., Disp: 90 tablet, Rfl: 2   tirzepatide (MOUNJARO) 15 MG/0.5ML Pen, Inject 15 mg into the skin once a week., Disp: 2 mL, Rfl: 3   Allergies  Allergen Reactions   Penicillins Shortness Of Breath    CHEST PAIN     Review of Systems  Constitutional: Negative.   Respiratory: Negative.    Cardiovascular: Negative.   Musculoskeletal: Negative.   Neurological: Negative.   Psychiatric/Behavioral: Negative.       Today's Vitals   11/26/22 1122  BP: 128/72  Pulse: 87  Temp: 98.2 F (36.8 C)  TempSrc: Oral  Weight: 174 lb 12.8  oz (79.3 kg)  Height: '5\' 9"'$  (1.753 m)  PainSc: 0-No pain   Body mass index is 25.81 kg/m.  Wt Readings from Last 3 Encounters:  11/26/22 174 lb 12.8 oz (79.3 kg)  10/10/22 180 lb (81.6 kg)  07/24/22 183 lb 12.8 oz (83.4 kg)    Objective:  Physical Exam Vitals and nursing note reviewed.  Constitutional:      Appearance: Normal appearance.  HENT:     Head: Normocephalic and atraumatic.     Nose:     Comments: Masked     Mouth/Throat:     Comments: Masked  Eyes:     Extraocular Movements: Extraocular movements intact.  Cardiovascular:     Rate and Rhythm: Normal rate and regular rhythm.     Heart sounds: Normal heart sounds.  Pulmonary:     Effort: Pulmonary effort is normal.      Breath sounds: Normal breath sounds.  Musculoskeletal:     Cervical back: Normal range of motion.  Skin:    General: Skin is warm.  Neurological:     General: No focal deficit present.     Mental Status: She is alert.  Psychiatric:        Mood and Affect: Mood normal.        Behavior: Behavior normal.         Assessment And Plan:     1. Type 2 diabetes mellitus with hyperlipidemia (HCC) Comments: Chronic, currently on Mounjaro '15mg'$  weekly. We both agree we will titrate down once she has reached her goal weight, a1c has been controlled. - CMP14+EGFR - Hemoglobin A1c  2. Postsurgical hypothyroidism Comments: Chronic, I will check thyroid panel and adjust meds as needed. - TSH + free T4  3. Essential hypertension, benign Comments: Chronic, well controlled. She is encouraged to follow low sodium diet.  4. BMI 25.0-25.9,adult Comments: She is encouraged to aim for at least 150 minutes of exercise/week. Her BMI is acceptable for her demographic.     Patient was given opportunity to ask questions. Patient verbalized understanding of the plan and was able to repeat key elements of the plan. All questions were answered to their satisfaction.   I, Maximino Greenland, MD, have reviewed all documentation for this visit. The documentation on 11/26/22 for the exam, diagnosis, procedures, and orders are all accurate and complete.   IF YOU HAVE BEEN REFERRED TO A SPECIALIST, IT MAY TAKE 1-2 WEEKS TO SCHEDULE/PROCESS THE REFERRAL. IF YOU HAVE NOT HEARD FROM US/SPECIALIST IN TWO WEEKS, PLEASE GIVE [place] A CALL AT [phone].   THE PATIENT IS ENCOURAGED TO PRACTICE SOCIAL DISTANCING DUE TO THE COVID-19 PANDEMIC.

## 2022-11-27 ENCOUNTER — Encounter: Payer: Self-pay | Admitting: Internal Medicine

## 2022-11-27 LAB — CMP14+EGFR
ALT: 12 IU/L (ref 0–32)
AST: 14 IU/L (ref 0–40)
Albumin/Globulin Ratio: 1.8 (ref 1.2–2.2)
Albumin: 4.1 g/dL (ref 3.9–4.9)
Alkaline Phosphatase: 52 IU/L (ref 44–121)
BUN/Creatinine Ratio: 11 — ABNORMAL LOW (ref 12–28)
BUN: 9 mg/dL (ref 8–27)
Bilirubin Total: 0.7 mg/dL (ref 0.0–1.2)
CO2: 22 mmol/L (ref 20–29)
Calcium: 9.3 mg/dL (ref 8.7–10.3)
Chloride: 102 mmol/L (ref 96–106)
Creatinine, Ser: 0.81 mg/dL (ref 0.57–1.00)
Globulin, Total: 2.3 g/dL (ref 1.5–4.5)
Glucose: 98 mg/dL (ref 70–99)
Potassium: 4.3 mmol/L (ref 3.5–5.2)
Sodium: 138 mmol/L (ref 134–144)
Total Protein: 6.4 g/dL (ref 6.0–8.5)
eGFR: 79 mL/min/{1.73_m2} (ref >59)

## 2022-11-27 LAB — HEMOGLOBIN A1C
Est. average glucose Bld gHb Est-mCnc: 134 mg/dL
Hgb A1c MFr Bld: 6.3 % — ABNORMAL HIGH (ref 4.8–5.6)

## 2022-11-27 LAB — TSH+FREE T4
Free T4: 1.7 ng/dL (ref 0.82–1.77)
TSH: 1.83 u[IU]/mL (ref 0.450–4.500)

## 2022-11-28 ENCOUNTER — Other Ambulatory Visit: Payer: Self-pay

## 2022-11-29 ENCOUNTER — Other Ambulatory Visit: Payer: Self-pay

## 2022-11-29 ENCOUNTER — Other Ambulatory Visit (HOSPITAL_COMMUNITY): Payer: Self-pay

## 2022-11-30 ENCOUNTER — Other Ambulatory Visit: Payer: Self-pay

## 2022-11-30 ENCOUNTER — Other Ambulatory Visit (HOSPITAL_COMMUNITY): Payer: Self-pay

## 2022-11-30 MED ORDER — ACCU-CHEK GUIDE W/DEVICE KIT
PACK | 0 refills | Status: DC
  Filled 2022-11-30: qty 1, 25d supply, fill #0

## 2022-12-05 ENCOUNTER — Other Ambulatory Visit (HOSPITAL_COMMUNITY): Payer: Self-pay

## 2022-12-05 ENCOUNTER — Other Ambulatory Visit: Payer: Self-pay

## 2022-12-05 MED ORDER — ACCU-CHEK SOFTCLIX LANCETS MISC
12 refills | Status: DC
  Filled 2022-12-05: qty 100, 90d supply, fill #0

## 2022-12-05 MED ORDER — GLUCOSE BLOOD VI STRP
ORAL_STRIP | 12 refills | Status: DC
  Filled 2022-12-05 (×2): qty 100, 90d supply, fill #0

## 2022-12-18 ENCOUNTER — Ambulatory Visit: Payer: Medicare PPO | Admitting: Physician Assistant

## 2022-12-18 ENCOUNTER — Encounter: Payer: Self-pay | Admitting: Physician Assistant

## 2022-12-18 VITALS — BP 103/68 | HR 90 | Ht 69.0 in | Wt 173.0 lb

## 2022-12-18 DIAGNOSIS — M25512 Pain in left shoulder: Secondary | ICD-10-CM

## 2022-12-18 MED ORDER — MELOXICAM 7.5 MG PO TABS: 7.5000 mg | ORAL_TABLET | Freq: Every day | ORAL | 1 refills | Status: DC

## 2022-12-18 NOTE — Patient Instructions (Signed)
You are going to start meloxicam 7.5 mg once daily.  If this does not improve your pain after 5 to 7 days, it is okay to increase to 15 mg once daily.  I hope that you feel better soon, please let us know if there is anything else we can do for you  Kennieth Rad, PA-C Physician Assistant [place] [email or web address]   Meloxicam Capsules What is this medication? MELOXICAM (mel OX i cam) treats mild to moderate pain, inflammation, or arthritis. It works by decreasing inflammation. It belongs to a group of medications called NSAIDs. This medicine may be used for other purposes; ask your health care provider or pharmacist if you have questions. COMMON BRAND NAME(S): Vivlodex What should I tell my care team before I take this medication? They need to know if you have any of these conditions: Asthma (lung or breathing disease) Bleeding disorder Coronary artery bypass graft (CABG) within the past 2 weeks Dehydration Frequently drink alcohol Heart attack Heart disease Heart failure High blood pressure Kidney disease Liver disease Stomach bleeding Stomach ulcers, other stomach or intestine problems Take medications that treat or prevent blood clots Taking other steroids, such as dexamethasone or prednisone Tobacco use An unusual or allergic reaction to meloxicam, other medications, foods, dyes, or preservatives Pregnant or trying to get pregnant Breast-feeding How should I use this medication? Take this medication by mouth. Take it as directed on the prescription label at the same time every day. You can take it with or without food. If it upsets your stomach, take it with food. Do not use it more often than directed. There may be unused or extra doses in the bottle after you finish your treatment. Talk to your care team if you have questions about your dose. A special MedGuide will be given to you by the pharmacist with each  prescription and refill. Be sure to read this information carefully each time. Talk to your care team about the use of this medication in children. Special care may be needed. People over 58 years of age may have a stronger reaction and need a smaller dose. Overdosage: If you think you have taken too much of this medicine contact a poison control center or emergency room at once. NOTE: This medicine is only for you. Do not share this medicine with others. What if I miss a dose? If you miss a dose, take it as soon as you can. If it is almost time for your next dose, take only that dose. Do not take double or extra doses. What may interact with this medication? Do not take this medication with any of the following: Cidofovir Ketorolac This medication may also interact with the following: Alcohol Aspirin and aspirin-like medications Certain medications for blood pressure, heart disease, irregular heart beat Certain medications for mental health conditions Certain medications that treat or prevent blood clots, such as warfarin, enoxaparin, dalteparin, apixaban, dabigatran, rivaroxaban Cyclosporine Diuretics Fluconazole Lithium Methotrexate Other NSAIDs, medications for pain and inflammation, such as ibuprofen and naproxen Pemetrexed This list may not describe all possible interactions. Give your health care provider a list of all the medicines, herbs, non-prescription drugs, or dietary supplements you use. Also tell them if you smoke, drink alcohol, or use illegal drugs. Some items may interact with your medicine. What should I watch for while using this medication? Visit your care team for regular checks on your progress. Tell your care team if your symptoms do not start to get better or  if they get worse. Do not take other medications that contain aspirin, ibuprofen, or naproxen with this medication. Side effects such as stomach upset, nausea, or ulcers may be more likely to occur. Many  non-prescription medications contain aspirin, ibuprofen, or naproxen. Always read labels carefully. This medication can cause serious ulcers and bleeding in the stomach. It can happen with no warning. Tobacco, alcohol, older age, and poor health can also increase risks. Call your care team right away if you have stomach pain or blood in your vomit or stool. This medication does not prevent a heart attack or stroke. This medication may increase the chance of a heart attack or stroke. The chance may increase the longer you use this medication or if you have heart disease. If you take aspirin to prevent a heart attack or stroke, talk to your care team about using this medication. This medication may cause serious skin reactions. They can happen weeks to months after starting the medication. Contact your care team right away if you notice fevers or flu-like symptoms with a rash. The rash may be red or purple and then turn into blisters or peeling of the skin. Or, you might notice a red rash with swelling of the face, lips or lymph nodes in your neck or under your arms. Talk to your care team if you wish to become pregnant or think you might be pregnant. This medication can cause serious birth defects. This medication may affect your coordination, reaction time, or judgment. Do not drive or operate machinery until you know how this medication affects you. Sit up or stand slowly to reduce the risk of dizzy or fainting spells. Drinking alcohol with this medication can increase the risk of these side effects. Be careful brushing or flossing your teeth or using a toothpick because you may get an infection or bleed more easily. If you have any dental work done, tell your dentist you are receiving this medication. This medication may make it more difficult to get pregnant. Talk to your care team if you are concerned about your fertility. What side effects may I notice from receiving this medication? Side effects that  you should report to your care team as soon as possible: Allergic reactions--skin rash, itching, hives, swelling of the face, lips, tongue, or throat Bleeding--bloody or black, tar-like stools, vomiting blood or brown material that looks like coffee grounds, red or dark brown urine, small red or purple spots on skin, unusual bruising or bleeding Heart attack--pain or tightness in the chest, shoulders, arms, or jaw, nausea, shortness of breath, cold or clammy skin, feeling faint or lightheaded Heart failure--shortness of breath, swelling of ankles, feet, or hands, sudden weight gain, unusual weakness or fatigue Increase in blood pressure Kidney injury--decrease in the amount of urine, swelling of the ankles, hands, or feet Liver injury--right upper belly pain, loss of appetite, nausea, light-colored stool, dark yellow or brown urine, yellowing skin or eyes, unusual weakness or fatigue Rash, fever, and swollen lymph nodes Redness, blistering, peeling, or loosening of the skin, including inside the mouth Stroke--sudden numbness or weakness of the face, arm, or leg, trouble speaking, confusion, trouble walking, loss of balance or coordination, dizziness, severe headache, change in vision Side effects that usually do not require medical attention (report to your care team if they continue or are bothersome): Diarrhea Nausea Upset stomach This list may not describe all possible side effects. Call your doctor for medical advice about side effects. You may report side effects to FDA  at 1-800-FDA-1088. Where should I keep my medication? Keep out of the reach of children and pets. Store at room temperature between 20 and 25 degrees C (68 and 77 degrees F). Keep this medication in the original container. Protect from moisture. Keep the container tightly closed. Get rid of any unused medication after the expiration date. To get rid of medications that are no longer needed or have expired: Take the  medication to a medication take-back program. Check with your pharmacy or law enforcement to find a location. If you cannot return the medication, check the label or package insert to see if the medication should be thrown out in the garbage or flushed down the toilet. If you are not sure, ask your care team. If it is safe to put it in the trash, empty the medication out of the container. Mix the medication with cat litter, dirt, coffee grounds, or other unwanted substance. Seal the mixture in a bag or container. Put it in the trash. NOTE: This sheet is a summary. It may not cover all possible information. If you have questions about this medicine, talk to your doctor, pharmacist, or health care provider.  2023 Elsevier/Gold Standard (2020-12-07 00:00:00)

## 2022-12-18 NOTE — Progress Notes (Signed)
Established Patient Office Visit  Subjective   Patient ID: Felicia Garrett, female    DOB: 1956-01-04  Age: 67 y.o. MRN: [id number]  Chief Complaint  Patient presents with   Left shoulder pain     Throbbing.     States that she has been experiencing pain in her left shoulder pain, denies injury or trauma.  States that she has seen orthopedics, was given a cortisone injection and encouraged to do exercise, use strengthening bands, encouraged her to consider physical therapy.  States that she declined physical therapy because [facility] and [facility].  States that she has been experiencing pain in the shoulder mostly at night, describes it as throbbing.  States that ibuprofen is not offering relief.      Past Medical History:  Diagnosis Date   Anemia    Arthritis    Depression    Diabetes mellitus    Hyperlipidemia    Thyroid nodule    Social History   Socioeconomic History   Marital status: Single    Spouse name: Not on file   Number of children: Not on file   Years of education: Not on file   Highest education level: Not on file  Occupational History   Not on file  Tobacco Use   Smoking status: Never   Smokeless tobacco: Never  Vaping Use   Vaping Use: Never used  Substance and Sexual Activity   Alcohol use: No   Drug use: No   Sexual activity: Never    Comment: meno   Other Topics Concern   Not on file  Social History Narrative   Not on file   Social Determinants of Health   Financial Resource Strain: Low Risk  (10/10/2022)   Overall Financial Resource Strain (CARDIA)    Difficulty of Paying Living Expenses: Not hard at all  Food Insecurity: No Food Insecurity (10/10/2022)   Hunger Vital Sign    Worried About Running Out of Food in the Last Year: Never true    Alger in the Last Year: Never true  Transportation Needs: No Transportation Needs (10/10/2022)   PRAPARE - Radiographer, therapeutic (Medical): No    Lack of Transportation (Non-Medical): No  Physical Activity: Sufficiently Active (10/10/2022)   Exercise Vital Sign    Days of Exercise per Week: 3 days    Minutes of Exercise per Session: 60 min  Stress: No Stress Concern Present (10/10/2022)   [place]    Feeling of Stress : Not at all  Social Connections: Not on file  Intimate Partner Violence: Not on file   Family History  Problem Relation Age of Onset   Hypertension Mother    Hyperlipidemia Mother    Hyperthyroidism Mother    Diabetes Father    Allergies  Allergen Reactions   Penicillins Shortness Of Breath    CHEST PAIN    Review of Systems  Constitutional: Negative.   HENT: Negative.    Eyes: Negative.   Respiratory:  Negative for shortness of breath.   Cardiovascular:  Negative for chest pain.  Gastrointestinal:  Negative for heartburn.  Genitourinary: Negative.   Musculoskeletal:  Positive for joint pain.  Skin: Negative.   Neurological: Negative.   Endo/Heme/Allergies: Negative.   Psychiatric/Behavioral: Negative.        Objective:     BP 103/68 (BP Location: Left Arm, Patient Position: Sitting,  Cuff Size: Normal)   Pulse 90   Ht 5\' 9"  (1.753 m)   Wt 173 lb (78.5 kg)   BMI 25.55 kg/m  BP Readings from Last 3 Encounters:  12/18/22 103/68  11/26/22 128/72  07/24/22 120/76   Wt Readings from Last 3 Encounters:  12/18/22 173 lb (78.5 kg)  11/26/22 174 lb 12.8 oz (79.3 kg)  10/10/22 180 lb (81.6 kg)      Physical Exam Vitals and nursing note reviewed.  Constitutional:      Appearance: Normal appearance.  HENT:     Head: Normocephalic and atraumatic.     Right Ear: External ear normal.     Left Ear: External ear normal.     Nose: Nose normal.     Mouth/Throat:     Mouth: Mucous membranes are moist.     Pharynx: Oropharynx is clear.  Eyes:     Extraocular Movements: Extraocular movements  intact.     Conjunctiva/sclera: Conjunctivae normal.     Pupils: Pupils are equal, round, and reactive to light.  Cardiovascular:     Rate and Rhythm: Normal rate and regular rhythm.     Pulses: Normal pulses.     Heart sounds: Normal heart sounds.  Pulmonary:     Effort: Pulmonary effort is normal.     Breath sounds: Normal breath sounds.  Musculoskeletal:     Right shoulder: Normal. No swelling. Normal range of motion. Normal strength.     Left shoulder: No swelling. Normal range of motion. Normal strength.     Cervical back: Normal range of motion and neck supple.  Skin:    General: Skin is warm and dry.  Neurological:     General: No focal deficit present.     Mental Status: She is alert and oriented to person, place, and time.  Psychiatric:        Mood and Affect: Mood normal.        Behavior: Behavior normal.        Thought Content: Thought content normal.        Judgment: Judgment normal.        Assessment & Plan:   Problem List Items Addressed This Visit   None Visit Diagnoses     Acute pain of left shoulder    -  Primary   Relevant Medications   meloxicam (MOBIC) 7.5 MG tablet     1. Acute pain of left shoulder Trial meloxicam.  Patient education given on supportive care, red flags given for prompt reevaluation. - meloxicam (MOBIC) 7.5 MG tablet; Take 1 tablet (7.5 mg total) by mouth daily.  Dispense: 30 tablet; Refill: 1   I have reviewed the patient's medical history (PMH, PSH, Social History, Family History, Medications, and allergies) , and have been updated if relevant. I spent 10 minutes reviewing chart and  face to face time with patient.    Return if symptoms worsen or fail to improve.    Loraine Grip Mayers, PA-C

## 2022-12-22 ENCOUNTER — Other Ambulatory Visit: Payer: Self-pay | Admitting: Internal Medicine

## 2022-12-26 ENCOUNTER — Other Ambulatory Visit (HOSPITAL_COMMUNITY): Payer: Self-pay

## 2023-01-01 ENCOUNTER — Other Ambulatory Visit (HOSPITAL_COMMUNITY): Payer: Self-pay

## 2023-01-01 ENCOUNTER — Encounter: Payer: Self-pay | Admitting: Internal Medicine

## 2023-01-01 ENCOUNTER — Encounter: Payer: Self-pay | Admitting: Plastic Surgery

## 2023-01-01 ENCOUNTER — Ambulatory Visit: Payer: Medicare PPO | Admitting: Plastic Surgery

## 2023-01-01 ENCOUNTER — Other Ambulatory Visit: Payer: Self-pay

## 2023-01-01 VITALS — BP 92/65 | HR 105 | Ht 70.0 in | Wt 174.8 lb

## 2023-01-01 DIAGNOSIS — Z6829 Body mass index (BMI) 29.0-29.9, adult: Secondary | ICD-10-CM

## 2023-01-01 DIAGNOSIS — Z719 Counseling, unspecified: Secondary | ICD-10-CM

## 2023-01-01 DIAGNOSIS — Z9884 Bariatric surgery status: Secondary | ICD-10-CM | POA: Diagnosis not present

## 2023-01-01 DIAGNOSIS — L987 Excessive and redundant skin and subcutaneous tissue: Secondary | ICD-10-CM

## 2023-01-01 MED ORDER — MOUNJARO 5 MG/0.5ML ~~LOC~~ SOAJ
5.0000 mg | SUBCUTANEOUS | 3 refills | Status: DC
  Filled 2023-01-01: qty 2, 28d supply, fill #0

## 2023-01-01 NOTE — Progress Notes (Signed)
Patient ID: Felicia Garrett, female    DOB: December 08, 1955, 67 y.o.   MRN: [id number]   Chief Complaint  Patient presents with   Advice Only   Skin Problem    The patient is a 67 year old female here for evaluation of her thighs and arms.  She has done an amazing job of weight loss over the past 10 years.  She is 5 feet 10 inches tall and weighs 147 pounds.  She is lost 100 pounds during that time.  She has been on medical management as well as had gastric surgery for the weight reduction.  She is still losing some and would like to continue to lose another few pounds.  Overall she is doing well and her last hemoglobin A1c was 6.3 in March.  Her past medical history includes thyroid disease and hyperlipidemia.  She has not had children or a C-section.  Due to the weight loss she has excess skin and some wrinkling of her upper arms and upper legs.  She wanted to see if and what could be done to improve those areas.    Review of Systems  Constitutional: Negative.   HENT: Negative.    Eyes: Negative.   Respiratory: Negative.    Cardiovascular: Negative.   Gastrointestinal: Negative.   Endocrine: Negative.   Genitourinary: Negative.   Musculoskeletal: Negative.     Past Medical History:  Diagnosis Date   Anemia    Arthritis    Depression    Diabetes mellitus    Hyperlipidemia    Thyroid nodule     Past Surgical History:  Procedure Laterality Date   LAPAROSCOPIC GASTRIC BANDING  06/14/08   THYROIDECTOMY N/A 10/08/2014   Procedure: NEAR TOTAL THYROIDECTOMY;  Surgeon: Valarie Merino, MD;  Location: [facility];  Service: General;  Laterality: N/A;   UTERINE FIBROID EMBOLIZATION  10 yrs ago      Current Outpatient Medications:    Accu-Chek Softclix Lancets lancets, Use as instructed, Disp: 100 each, Rfl: 12   atorvastatin (LIPITOR) 40 MG tablet, TAKE 1 TABLET BY MOUTH EVERY DAY, Disp: 90 tablet, Rfl: 1   Blood Glucose Monitoring Suppl (ACCU-CHEK GUIDE) w/Device KIT, USE AS DIRECTED TO  CHECK BLOOD SUGAR., Disp: 1 kit, Rfl: 0   buPROPion (WELLBUTRIN XL) 150 MG 24 hr tablet, TAKE 1 TABLET BY MOUTH EVERY DAY, Disp: 90 tablet, Rfl: 2   Dapagliflozin-metFORMIN HCl ER (XIGDUO XR) 01-999 MG TB24, TAKE 2 TABLETS BY MOUTH EVERY MORNING, Disp: 180 tablet, Rfl: 2   DRYSOL 20 % external solution, Apply topically daily as needed., Disp: 35 mL, Rfl: 1   glucose blood test strip, Use as instructed, Disp: 100 each, Rfl: 12   Iron-FA-B Cmp-C-Biot-Probiotic (FUSION PLUS) CAPS, TAKE 1 CAPSULE BY ORAL ROUTE EVERY DAY BETWEEN MEALS, Disp: 30 capsule, Rfl: 1   levothyroxine (SYNTHROID) 137 MCG tablet, Take 1 tablet (137 mcg total) by mouth daily before breakfast., Disp: 90 tablet, Rfl: 2   losartan (COZAAR) 50 MG tablet, TAKE 1 TABLET BY MOUTH EVERY DAY, Disp: 90 tablet, Rfl: 1   meloxicam (MOBIC) 7.5 MG tablet, Take 1 tablet (7.5 mg total) by mouth daily., Disp: 30 tablet, Rfl: 1   methocarbamol (ROBAXIN) 750 MG tablet, TAKE 1 TABLET BY MOUTH 3 TIMES DAILY., Disp: 40 tablet, Rfl: 1   sodium fluoride (PREVIDENT 5000 PLUS) 1.1 % CREA dental cream, Denta 5000 Plus 1.1 % cream  USE AS DIRECTED, Disp: , Rfl:    tirzepatide (MOUNJARO) 12.5 MG/0.5ML Pen,  Inject 12.5 mg into the skin once a week., Disp: 2 mL, Rfl: 0   tirzepatide (MOUNJARO) 15 MG/0.5ML Pen, Inject 15 mg into the skin once a week., Disp: 2 mL, Rfl: 3   Objective:   Vitals:   01/01/23 0916  BP: 92/65  Pulse: (!) 105  SpO2: 90%    Physical Exam Vitals and nursing note reviewed.  Constitutional:      Appearance: Normal appearance.  HENT:     Head: Normocephalic and atraumatic.  Cardiovascular:     Rate and Rhythm: Normal rate.     Pulses: Normal pulses.  Pulmonary:     Effort: Pulmonary effort is normal.  Musculoskeletal:        General: No swelling or deformity.  Skin:    General: Skin is warm.     Capillary Refill: Capillary refill takes less than 2 seconds.     Coloration: Skin is not jaundiced.     Findings: No  bruising.  Neurological:     Mental Status: She is alert and oriented to person, place, and time.  Psychiatric:        Mood and Affect: Mood normal.        Behavior: Behavior normal.        Thought Content: Thought content normal.        Judgment: Judgment normal.     Assessment & Plan:  BMI 29.0-29.9,adult  Encounter for counseling  After discussion the patient has decided that it would be a lot of surgery for a little improvement so she is going to hold off on it now.  She knows where we are if she changes her mind.  Pictures were obtained of the patient and placed in the chart with the patient's or guardian's permission.   Alena Bills Wendee Hata, DO

## 2023-01-04 ENCOUNTER — Other Ambulatory Visit (HOSPITAL_COMMUNITY): Payer: Self-pay

## 2023-01-09 ENCOUNTER — Other Ambulatory Visit (HOSPITAL_COMMUNITY): Payer: Self-pay

## 2023-01-10 ENCOUNTER — Other Ambulatory Visit (HOSPITAL_COMMUNITY): Payer: Self-pay

## 2023-01-14 ENCOUNTER — Other Ambulatory Visit: Payer: Self-pay | Admitting: Internal Medicine

## 2023-02-10 ENCOUNTER — Other Ambulatory Visit: Payer: Self-pay | Admitting: Physician Assistant

## 2023-02-10 DIAGNOSIS — M25512 Pain in left shoulder: Secondary | ICD-10-CM

## 2023-02-12 NOTE — Telephone Encounter (Signed)
Requested Prescriptions  Pending Prescriptions Disp Refills   meloxicam (MOBIC) 7.5 MG tablet [Pharmacy Med Name: MELOXICAM 7.5 MG TABLET] 30 tablet 1    Sig: TAKE 1 TABLET BY MOUTH EVERY DAY     There is no refill protocol information for this order

## 2023-02-19 ENCOUNTER — Ambulatory Visit: Payer: Medicare PPO | Admitting: Physician Assistant

## 2023-02-19 ENCOUNTER — Encounter: Payer: Self-pay | Admitting: Physician Assistant

## 2023-02-19 ENCOUNTER — Other Ambulatory Visit (HOSPITAL_COMMUNITY): Payer: Self-pay

## 2023-02-19 VITALS — BP 93/67 | HR 107 | Ht 69.0 in | Wt 176.0 lb

## 2023-02-19 DIAGNOSIS — L249 Irritant contact dermatitis, unspecified cause: Secondary | ICD-10-CM | POA: Diagnosis not present

## 2023-02-19 MED ORDER — METHYLPREDNISOLONE ACETATE 80 MG/ML IJ SUSP
80.0000 mg | Freq: Once | INTRAMUSCULAR | Status: AC
  Administered 2023-02-19: 80 mg via INTRAMUSCULAR

## 2023-02-19 MED ORDER — TRIAMCINOLONE ACETONIDE 0.1 % EX CREA
1.0000 | TOPICAL_CREAM | Freq: Two times a day (BID) | CUTANEOUS | 0 refills | Status: DC
Start: 2023-02-19 — End: 2023-06-10

## 2023-02-19 NOTE — Progress Notes (Signed)
Established Patient Office Visit  Subjective   Patient ID: Felicia Garrett, female    DOB: [birth year]  Age: 67 y.o. MRN: [id number]  Chief Complaint  Patient presents with   Rash    Located on arms, stomach and back     States that she has been experiencing an itchy rash for the past 4 days.  States that she believes it was due to working in her yard pulling weeds without gloves on.  States that the rash started on both sides of her face, has spread to her neck, abdomen, lower back, both arms.  Denies any new detergents, medications, fragrances, body washes, lotions.  No one else in house with similar rash.  States that she has been using over-the-counter cortisone cream and loratidine without much relief.        Past Medical History:  Diagnosis Date   Anemia    Arthritis    Depression    Diabetes mellitus    Hyperlipidemia    Thyroid nodule    Social History   Socioeconomic History   Marital status: Single    Spouse name: Not on file   Number of children: Not on file   Years of education: Not on file   Highest education level: Not on file  Occupational History   Not on file  Tobacco Use   Smoking status: Never   Smokeless tobacco: Never  Vaping Use   Vaping Use: Never used  Substance and Sexual Activity   Alcohol use: No   Drug use: No   Sexual activity: Never    Comment: meno   Other Topics Concern   Not on file  Social History Narrative   Not on file   Social Determinants of Health   Financial Resource Strain: Low Risk  (10/10/2022)   Overall Financial Resource Strain (CARDIA)    Difficulty of Paying Living Expenses: Not hard at all  Food Insecurity: No Food Insecurity (10/10/2022)   Hunger Vital Sign    Worried About Running Out of Food in the Last Year: Never true    Ran Out of Food in the Last Year: Never true  Transportation Needs: No Transportation Needs (10/10/2022)   PRAPARE - Administrator, Civil Service (Medical): No    Lack of  Transportation (Non-Medical): No  Physical Activity: Sufficiently Active (10/10/2022)   Exercise Vital Sign    Days of Exercise per Week: 3 days    Minutes of Exercise per Session: 60 min  Stress: No Stress Concern Present (10/10/2022)   [employer] of Occupational Health - Occupational Stress Questionnaire    Feeling of Stress : Not at all  Social Connections: Not on file  Intimate Partner Violence: Not on file   Family History  Problem Relation Age of Onset   Hypertension Mother    Hyperlipidemia Mother    Hyperthyroidism Mother    Diabetes Father    Allergies  Allergen Reactions   Penicillins Shortness Of Breath    CHEST PAIN    Review of Systems  Constitutional:  Negative for chills and fever.  HENT: Negative.    Eyes: Negative.   Respiratory:  Negative for shortness of breath.   Cardiovascular:  Negative for chest pain.  Gastrointestinal: Negative.   Genitourinary: Negative.   Musculoskeletal: Negative.   Skin:  Positive for itching and rash.  Neurological: Negative.   Endo/Heme/Allergies: Negative.   Psychiatric/Behavioral: Negative.        Objective:     BP 93/67 (  BP Location: Left Arm, Patient Position: Sitting, Cuff Size: Large)   Pulse (!) 107   Ht 5\' 9"  (1.753 m)   Wt 176 lb (79.8 kg)   SpO2 99%   BMI 25.99 kg/m    Physical Exam Vitals reviewed.  Constitutional:      Appearance: Normal appearance.  HENT:     Head: Normocephalic and atraumatic.     Right Ear: External ear normal.     Left Ear: External ear normal.     Nose: Nose normal.     Mouth/Throat:     Mouth: Mucous membranes are moist.     Pharynx: Oropharynx is clear.  Eyes:     Extraocular Movements: Extraocular movements intact.     Conjunctiva/sclera: Conjunctivae normal.     Pupils: Pupils are equal, round, and reactive to light.  Cardiovascular:     Rate and Rhythm: Normal rate and regular rhythm.     Pulses: Normal pulses.     Heart sounds: Normal heart sounds.   Pulmonary:     Effort: Pulmonary effort is normal.     Breath sounds: Normal breath sounds.  Musculoskeletal:        General: Normal range of motion.     Cervical back: Neck supple.  Skin:    General: Skin is warm and dry.     Findings: Rash present. Rash is vesicular. Rash is not pustular or urticarial.     Comments: Scattered patches of slightly erythematic vesicles different stages of healing noted on neck, both arms, abdomen, and lower back.  Neurological:     General: No focal deficit present.     Mental Status: She is alert and oriented to person, place, and time.  Psychiatric:        Mood and Affect: Mood normal.        Behavior: Behavior normal.        Thought Content: Thought content normal.        Judgment: Judgment normal.        Assessment & Plan:   Problem List Items Addressed This Visit   None Visit Diagnoses     Irritant contact dermatitis, unspecified trigger    -  Primary   Relevant Medications   methylPREDNISolone acetate (DEPO-MEDROL) injection 80 mg (Start on 02/19/2023 10:45 AM)   triamcinolone cream (KENALOG) 0.1 %     1. Irritant contact dermatitis, unspecified trigger Trial Kenalog cream, patient education given on supportive care, red flags given for prompt reevaluation. - methylPREDNISolone acetate (DEPO-MEDROL) injection 80 mg - triamcinolone cream (KENALOG) 0.1 %; Apply 1 Application topically 2 (two) times daily.  Dispense: 45 g; Refill: 0     I have reviewed the patient's medical history (PMH, PSH, Social History, Family History, Medications, and allergies) , and have been updated if relevant. I spent 20 minutes reviewing chart and  face to face time with patient.   Return if symptoms worsen or fail to improve.    Kasandra Knudsen Mayers, PA-C

## 2023-02-19 NOTE — Patient Instructions (Signed)
To help with your rash, I sent a prescription of triamcinolone cream to your pharmacy.  Please let us know there is anything else we can do for you  Roney Jaffe, PA-C Physician Assistant [facility] [email or web address]   Contact Dermatitis Dermatitis is redness, soreness, and swelling (inflammation) of the skin. Contact dermatitis is a reaction to certain substances that touch the skin. There are two types of this condition: Irritant contact dermatitis. This is the most common type. It happens when something irritates your skin, such as when your hands get dry from washing them too often with soap. You can get this type of reaction even if you have not been exposed to the irritant before. Allergic contact dermatitis. This type is caused by a substance that you are allergic to, such as poison ivy. It occurs when you have been exposed to the substance (allergen) and form a sensitivity to it. In some cases, the reaction may start soon after your first exposure to the allergen. In other cases, it may not start until you are exposed to the allergen again. It may then occur every time you are exposed to the allergen in the future. What are the causes? Irritant contact dermatitis is often caused by exposure to: Makeup. Soaps, detergents, and bleaches. Acids. Metal salts, such as nickel. Allergic contact dermatitis is often caused by exposure to: Poisonous plants. Chemicals. Jewelry. Latex. Medicines. Preservatives in products, such as clothes. What increases the risk? You are more likely to get this condition if you have: A job that exposes you to irritants or allergens. Certain medical conditions. These include asthma and eczema. What are the signs or symptoms? Symptoms of this condition may occur in any place on your body that has been touched by the irritant. Symptoms include: Dryness, flaking, or  cracking. Redness. Itching. Pain or a burning feeling. Blisters. Drainage of small amounts of blood or clear fluid from skin cracks. With allergic contact dermatitis, there may also be swelling in areas such as the eyelids, mouth, or genitals. How is this diagnosed? This condition is diagnosed with a medical history and physical exam. A patch skin test may be done to help figure out the cause. If the condition is related to your job, you may need to see an expert in health problems in the workplace (occupational medicine specialist). How is this treated? This condition is treated by staying away from the cause of the reaction and protecting your skin from further contact. Treatment may also include: Steroid creams or ointments. Steroid medicines may need be taken by mouth (orally) in more severe cases. Antibiotics or medicines applied to the skin to kill bacteria (antibacterial ointments). These may be needed if a skin infection is present. Antihistamines. These may be taken orally or put on as a lotion to ease itching. A bandage (dressing). Follow these instructions at home: Skin care Moisturize your skin as needed. Put cool, wet cloths (cool compresses) on the affected areas. Try applying baking soda paste to your skin. Stir water into baking soda until it has the consistency of a paste. Do not scratch your skin. Avoid friction to the affected area. Avoid the use of soaps, perfumes, and dyes. Check the affected areas every day for signs of infection. Check for: More redness, swelling, or pain. More fluid or blood. Warmth. Pus or a bad smell. Medicines Take or apply over-the-counter and prescription medicines only as told by your health care provider. If you were prescribed antibiotics, take or apply  them as told by your health care provider. Do not stop using the antibiotic even if you start to feel better. Bathing Try taking a bath with: Epsom salts. Follow the instructions on  the packaging. You can get these at your local pharmacy or grocery store. Baking soda. Pour a small amount into the bath as told by your health care provider. Colloidal oatmeal. Follow the instructions on the packaging. You can get this at your local pharmacy or grocery store. Bathe less often. This may mean bathing every other day. Bathe in lukewarm water. Avoid using hot water. Bandage care If you were given a dressing, change it as told by your health care provider. Wash your hands with soap and water for at least 20 seconds before and after you change your dressing. If soap and water are not available, use hand sanitizer. General instructions Avoid the substance that caused your reaction. If you do not know what caused it, keep a journal to try to track what caused it. Write down: What you eat and drink. What cosmetics you use. What you wear in the affected area. This includes jewelry. Contact a health care provider if: Your condition does not get better with treatment. Your condition gets worse. You have any signs of infection. You have a fever. You have new symptoms. Your bone or joint under the affected area becomes painful after the skin has healed. Get help right away if: You notice red streaks coming from the affected area. The affected area turns darker. You have trouble breathing. This information is not intended to replace advice given to you by your health care provider. Make sure you discuss any questions you have with your health care provider. Document Revised: 03/16/2022 Document Reviewed: 03/16/2022 Elsevier Patient Education  2024 [employer].

## 2023-03-01 DIAGNOSIS — M67911 Unspecified disorder of synovium and tendon, right shoulder: Secondary | ICD-10-CM | POA: Diagnosis not present

## 2023-03-01 DIAGNOSIS — M19011 Primary osteoarthritis, right shoulder: Secondary | ICD-10-CM | POA: Diagnosis not present

## 2023-03-08 DIAGNOSIS — M19011 Primary osteoarthritis, right shoulder: Secondary | ICD-10-CM | POA: Diagnosis not present

## 2023-03-08 DIAGNOSIS — M67911 Unspecified disorder of synovium and tendon, right shoulder: Secondary | ICD-10-CM | POA: Diagnosis not present

## 2023-03-12 ENCOUNTER — Other Ambulatory Visit: Payer: Self-pay | Admitting: Internal Medicine

## 2023-03-12 ENCOUNTER — Other Ambulatory Visit (HOSPITAL_COMMUNITY): Payer: Self-pay

## 2023-03-12 MED ORDER — MOUNJARO 12.5 MG/0.5ML ~~LOC~~ SOAJ
12.5000 mg | SUBCUTANEOUS | 2 refills | Status: DC
  Filled 2023-03-12: qty 2, 28d supply, fill #0
  Filled 2023-04-11: qty 2, 28d supply, fill #1

## 2023-03-15 ENCOUNTER — Other Ambulatory Visit (HOSPITAL_COMMUNITY): Payer: Self-pay

## 2023-04-01 ENCOUNTER — Ambulatory Visit: Payer: Medicare PPO | Admitting: Internal Medicine

## 2023-04-01 ENCOUNTER — Other Ambulatory Visit: Payer: Medicare PPO

## 2023-04-04 ENCOUNTER — Encounter: Payer: Medicare PPO | Admitting: Internal Medicine

## 2023-04-08 ENCOUNTER — Other Ambulatory Visit (HOSPITAL_COMMUNITY): Payer: Self-pay

## 2023-04-11 ENCOUNTER — Encounter: Payer: Self-pay | Admitting: Internal Medicine

## 2023-04-11 ENCOUNTER — Ambulatory Visit: Payer: Medicare PPO | Admitting: Internal Medicine

## 2023-04-11 ENCOUNTER — Other Ambulatory Visit (HOSPITAL_COMMUNITY): Payer: Self-pay

## 2023-04-11 VITALS — BP 108/68 | HR 62 | Temp 97.7°F | Ht 69.0 in | Wt 171.6 lb

## 2023-04-11 DIAGNOSIS — H9202 Otalgia, left ear: Secondary | ICD-10-CM | POA: Insufficient documentation

## 2023-04-11 DIAGNOSIS — E663 Overweight: Secondary | ICD-10-CM | POA: Diagnosis not present

## 2023-04-11 DIAGNOSIS — I10 Essential (primary) hypertension: Secondary | ICD-10-CM

## 2023-04-11 DIAGNOSIS — R002 Palpitations: Secondary | ICD-10-CM

## 2023-04-11 DIAGNOSIS — Z6825 Body mass index (BMI) 25.0-25.9, adult: Secondary | ICD-10-CM

## 2023-04-11 DIAGNOSIS — H6122 Impacted cerumen, left ear: Secondary | ICD-10-CM

## 2023-04-11 DIAGNOSIS — E785 Hyperlipidemia, unspecified: Secondary | ICD-10-CM

## 2023-04-11 DIAGNOSIS — E1169 Type 2 diabetes mellitus with other specified complication: Secondary | ICD-10-CM

## 2023-04-11 DIAGNOSIS — Z7984 Long term (current) use of oral hypoglycemic drugs: Secondary | ICD-10-CM

## 2023-04-11 DIAGNOSIS — Z7985 Long-term (current) use of injectable non-insulin antidiabetic drugs: Secondary | ICD-10-CM

## 2023-04-11 DIAGNOSIS — E2839 Other primary ovarian failure: Secondary | ICD-10-CM | POA: Diagnosis not present

## 2023-04-11 MED ORDER — CIPROFLOXACIN-DEXAMETHASONE 0.3-0.1 % OT SUSP: 4.0000 [drp] | Freq: Two times a day (BID) | OTIC | 0 refills | Status: DC

## 2023-04-11 MED ORDER — MOUNJARO 12.5 MG/0.5ML ~~LOC~~ SOAJ
12.5000 mg | SUBCUTANEOUS | 2 refills | Status: DC
Start: 2023-04-11 — End: 2023-07-19
  Filled 2023-04-11: qty 2, 28d supply, fill #0
  Filled 2023-05-16: qty 2, 28d supply, fill #1
  Filled 2023-06-10: qty 2, 28d supply, fill #2

## 2023-04-11 NOTE — Patient Instructions (Signed)

## 2023-04-11 NOTE — Progress Notes (Unsigned)
I,[name], CMA,acting as a Neurosurgeon for [name], MD.,have documented all relevant documentation on the behalf of [name], MD,as directed by  [name], MD while in the presence of [name], MD.  Subjective:  Patient ID: Felicia Garrett , female    DOB: 1956-05-20 , 67 y.o.   MRN: [id number]  No chief complaint on file.   HPI  She is here today for diabetes & BP f/u.  She reports compliance with meds. Patient also complains of left ear pain that started yesterday. She admits it is not an excruciating pain, just a pain she notices.   Diabetes She presents for her follow-up diabetic visit. She has type 2 diabetes mellitus. Her disease course has been stable. There are no hypoglycemic associated symptoms. There are no hypoglycemic complications. Risk factors for coronary artery disease include diabetes mellitus, dyslipidemia, hypertension, obesity and post-menopausal. She participates in exercise every other day. Eye exam is current.  P   Past Medical History:  Diagnosis Date   Anemia    Arthritis    Depression    Diabetes mellitus    Hyperlipidemia    Thyroid nodule      Family History  Problem Relation Age of Onset   Hypertension Mother    Hyperlipidemia Mother    Hyperthyroidism Mother    Diabetes Father      Current Outpatient Medications:    Accu-Chek Softclix Lancets lancets, Use as instructed, Disp: 100 each, Rfl: 12   atorvastatin (LIPITOR) 40 MG tablet, TAKE 1 TABLET BY MOUTH EVERY DAY, Disp: 90 tablet, Rfl: 1   Blood Glucose Monitoring Suppl (ACCU-CHEK GUIDE) w/Device KIT, USE AS DIRECTED TO CHECK BLOOD SUGAR., Disp: 1 kit, Rfl: 0   buPROPion (WELLBUTRIN XL) 150 MG 24 hr tablet, TAKE 1 TABLET BY MOUTH EVERY DAY, Disp: 90 tablet, Rfl: 2   Dapagliflozin-metFORMIN HCl ER (XIGDUO XR) 01-999 MG TB24, TAKE 2 TABLETS BY MOUTH EVERY MORNING, Disp: 180 tablet, Rfl: 2   DRYSOL 20 % external solution, Apply topically daily as needed., Disp: 35  mL, Rfl: 1   glucose blood test strip, Use as instructed, Disp: 100 each, Rfl: 12   Iron-FA-B Cmp-C-Biot-Probiotic (FUSION PLUS) CAPS, TAKE 1 CAPSULE BY MOUTH ONCE DAILY BETWEEN MEALS, Disp: 90 capsule, Rfl: 1   levothyroxine (SYNTHROID) 137 MCG tablet, Take 1 tablet (137 mcg total) by mouth daily before breakfast., Disp: 90 tablet, Rfl: 2   losartan (COZAAR) 50 MG tablet, TAKE 1 TABLET BY MOUTH EVERY DAY, Disp: 90 tablet, Rfl: 1   meloxicam (MOBIC) 7.5 MG tablet, Take 1 tablet (7.5 mg total) by mouth daily., Disp: 30 tablet, Rfl: 1   methocarbamol (ROBAXIN) 750 MG tablet, TAKE 1 TABLET BY MOUTH 3 TIMES DAILY., Disp: 40 tablet, Rfl: 1   sodium fluoride (PREVIDENT 5000 PLUS) 1.1 % CREA dental cream, Denta 5000 Plus 1.1 % cream  USE AS DIRECTED, Disp: , Rfl:    triamcinolone cream (KENALOG) 0.1 %, Apply 1 Application topically 2 (two) times daily., Disp: 45 g, Rfl: 0   tirzepatide (MOUNJARO) 12.5 MG/0.5ML Pen, Inject 12.5 mg into the skin once a week., Disp: 2 mL, Rfl: 2   Allergies  Allergen Reactions   Penicillins Shortness Of Breath    CHEST PAIN     Review of Systems  Constitutional: Negative.   HENT:         She c/o left ear pain. She thinks she has swimmers ear. Sx started yesteray - no fever/chills.  Respiratory: Negative.    Cardiovascular: Negative.   Gastrointestinal: Negative.   Skin: Negative.   Neurological: Negative.   Psychiatric/Behavioral: Negative.       Today's Vitals   04/11/23 1104  BP: 108/68  Pulse: 62  Temp: 97.7 F (36.5 C)  SpO2: 98%  Weight: 171 lb 9.6 oz (77.8 kg)  Height: 5\' 9"  (1.753 m)   Body mass index is 25.34 kg/m.  Wt Readings from Last 3 Encounters:  04/11/23 171 lb 9.6 oz (77.8 kg)  02/19/23 176 lb (79.8 kg)  01/01/23 174 lb 12.8 oz (79.3 kg)    The 10-year ASCVD risk score (Arnett DK, et al., 2019) is: 12.8%   Values used to calculate the score:     Age: 83 years     Sex: Female     Is Non-Hispanic African American: Yes      Diabetic: Yes     Tobacco smoker: No     Systolic Blood Pressure: 108 mmHg     Is BP treated: Yes     HDL Cholesterol: 78 mg/dL     Total Cholesterol: 144 mg/dL  Objective:  Physical Exam Vitals and nursing note reviewed.  Constitutional:      Appearance: Normal appearance.  HENT:     Head: Normocephalic and atraumatic.     Right Ear: Tympanic membrane, ear canal and external ear normal.     Left Ear: Ear canal and external ear normal. There is impacted cerumen.  Eyes:     Extraocular Movements: Extraocular movements intact.  Cardiovascular:     Rate and Rhythm: Normal rate and regular rhythm.     Pulses:          Dorsalis pedis pulses are 2+ on the right side and 2+ on the left side.     Heart sounds: Normal heart sounds.  Pulmonary:     Effort: Pulmonary effort is normal.     Breath sounds: Normal breath sounds.  Musculoskeletal:     Cervical back: Normal range of motion.  Feet:     Right foot:     Protective Sensation: 5 sites tested.  5 sites sensed.     Toenail Condition: Right toenails are abnormally thick.     Left foot:     Protective Sensation: 5 sites tested.  5 sites sensed.     Toenail Condition: Left toenails are abnormally thick.  Skin:    General: Skin is warm.  Neurological:     General: No focal deficit present.     Mental Status: She is alert.  Psychiatric:        Mood and Affect: Mood normal.        Behavior: Behavior normal.         Assessment And Plan:  Type 2 diabetes mellitus with hyperlipidemia (HCC) -     Basic metabolic panel -     Hemoglobin A1c -     Lipid panel -     Mounjaro; Inject 12.5 mg into the skin once a week.  Dispense: 2 mL; Refill: 2 -     ALT  Essential hypertension, benign  Left ear pain  Overweight with body mass index (BMI) of 25 to 25.9 in adult  Estrogen deficiency    Return if symptoms worsen or fail to improve.  Patient was given opportunity to ask questions. Patient verbalized understanding of the plan  and was able to repeat key elements of the plan. All questions were answered to their satisfaction.  I, [name], MD, have reviewed all documentation for this visit. The documentation on 04/11/23 for the exam, diagnosis, procedures, and orders are all accurate and complete.   IF YOU HAVE BEEN REFERRED TO A SPECIALIST, IT MAY TAKE 1-2 WEEKS TO SCHEDULE/PROCESS THE REFERRAL. IF YOU HAVE NOT HEARD FROM US/SPECIALIST IN TWO WEEKS, PLEASE GIVE [place] A CALL AT [phone].

## 2023-04-12 ENCOUNTER — Other Ambulatory Visit (HOSPITAL_COMMUNITY): Payer: Self-pay

## 2023-04-12 LAB — LIPID PANEL
Chol/HDL Ratio: 2 ratio (ref 0.0–4.4)
Cholesterol, Total: 133 mg/dL (ref 100–199)
HDL: 67 mg/dL (ref >39)
LDL Chol Calc (NIH): 55 mg/dL (ref 0–99)
Triglycerides: 44 mg/dL (ref 0–149)
VLDL Cholesterol Cal: 11 mg/dL (ref 5–40)

## 2023-04-12 LAB — BASIC METABOLIC PANEL
BUN/Creatinine Ratio: 17 (ref 12–28)
BUN: 12 mg/dL (ref 8–27)
CO2: 22 mmol/L (ref 20–29)
Calcium: 9.4 mg/dL (ref 8.7–10.3)
Chloride: 105 mmol/L (ref 96–106)
Creatinine, Ser: 0.71 mg/dL (ref 0.57–1.00)
Glucose: 102 mg/dL — ABNORMAL HIGH (ref 70–99)
Potassium: 3.7 mmol/L (ref 3.5–5.2)
Sodium: 142 mmol/L (ref 134–144)
eGFR: 93 mL/min/{1.73_m2} (ref >59)

## 2023-04-12 LAB — ALT: ALT: 16 IU/L (ref 0–32)

## 2023-04-12 LAB — HEMOGLOBIN A1C
Est. average glucose Bld gHb Est-mCnc: 154 mg/dL
Hgb A1c MFr Bld: 7 % — ABNORMAL HIGH (ref 4.8–5.6)

## 2023-04-12 LAB — MAGNESIUM: Magnesium: 1.9 mg/dL (ref 1.6–2.3)

## 2023-04-12 LAB — TSH: TSH: 3.04 u[IU]/mL (ref 0.450–4.500)

## 2023-04-15 ENCOUNTER — Telehealth: Payer: Self-pay | Admitting: Internal Medicine

## 2023-04-15 DIAGNOSIS — R002 Palpitations: Secondary | ICD-10-CM | POA: Insufficient documentation

## 2023-04-15 DIAGNOSIS — H6122 Impacted cerumen, left ear: Secondary | ICD-10-CM | POA: Insufficient documentation

## 2023-04-15 DIAGNOSIS — E2839 Other primary ovarian failure: Secondary | ICD-10-CM | POA: Insufficient documentation

## 2023-04-15 NOTE — Assessment & Plan Note (Signed)
She plans to use Debrox gtt. She does not wish to have her ears flushed in the office.

## 2023-04-15 NOTE — Assessment & Plan Note (Signed)
Unable to visualize TM due to cerumen impaction. Due to her concerns, will send rx Ciprodex to use if her sx persist after use of Debrox gtt to address the impaction.

## 2023-04-15 NOTE — Assessment & Plan Note (Signed)
Chronic, LDL goal < 70. She will c/w atorvastatin 40mg , Xigduo XR 5/1000mg  2 tabs daily and Mounjaro 12.5mg  weekly. She will f/u in 3-4 months for re-evaluation.

## 2023-04-15 NOTE — Assessment & Plan Note (Signed)
She agrees to bone density, [facility].

## 2023-04-15 NOTE — Assessment & Plan Note (Addendum)
Intermittent. I will check labs as below. She is encouraged to stay well hydrated.  I will also refer her to Cardiology for further evaluation. She will likely need a Zio patch to determine if arrhythmia is present.

## 2023-04-15 NOTE — Assessment & Plan Note (Signed)
She has lost 5lbs since May 2024. She is encouraged to aim for at least 150 minutes of exercise/week.

## 2023-04-16 DIAGNOSIS — E782 Mixed hyperlipidemia: Secondary | ICD-10-CM | POA: Diagnosis not present

## 2023-04-16 DIAGNOSIS — Z86010 Personal history of colonic polyps: Secondary | ICD-10-CM | POA: Diagnosis not present

## 2023-04-16 DIAGNOSIS — Z1211 Encounter for screening for malignant neoplasm of colon: Secondary | ICD-10-CM | POA: Diagnosis not present

## 2023-04-16 DIAGNOSIS — I10 Essential (primary) hypertension: Secondary | ICD-10-CM | POA: Diagnosis not present

## 2023-04-16 DIAGNOSIS — K5904 Chronic idiopathic constipation: Secondary | ICD-10-CM | POA: Diagnosis not present

## 2023-04-22 ENCOUNTER — Other Ambulatory Visit: Payer: Medicare PPO

## 2023-04-22 ENCOUNTER — Ambulatory Visit: Payer: Medicare PPO | Admitting: Internal Medicine

## 2023-04-24 ENCOUNTER — Other Ambulatory Visit: Payer: Self-pay | Admitting: *Deleted

## 2023-04-24 DIAGNOSIS — D508 Other iron deficiency anemias: Secondary | ICD-10-CM

## 2023-04-25 ENCOUNTER — Other Ambulatory Visit: Payer: Self-pay

## 2023-04-25 ENCOUNTER — Inpatient Hospital Stay: Payer: Medicare PPO | Attending: Internal Medicine

## 2023-04-25 ENCOUNTER — Inpatient Hospital Stay: Payer: Medicare PPO | Admitting: Internal Medicine

## 2023-04-25 VITALS — BP 102/87 | HR 89 | Temp 97.9°F | Resp 18 | Ht 69.0 in | Wt 170.6 lb

## 2023-04-25 DIAGNOSIS — D508 Other iron deficiency anemias: Secondary | ICD-10-CM

## 2023-04-25 DIAGNOSIS — D509 Iron deficiency anemia, unspecified: Secondary | ICD-10-CM | POA: Diagnosis not present

## 2023-04-25 DIAGNOSIS — D708 Other neutropenia: Secondary | ICD-10-CM

## 2023-04-25 DIAGNOSIS — Z79899 Other long term (current) drug therapy: Secondary | ICD-10-CM | POA: Insufficient documentation

## 2023-04-25 DIAGNOSIS — D72819 Decreased white blood cell count, unspecified: Secondary | ICD-10-CM | POA: Insufficient documentation

## 2023-04-25 LAB — CBC WITH DIFFERENTIAL (CANCER CENTER ONLY)
Abs Immature Granulocytes: 0 10*3/uL (ref 0.00–0.07)
Basophils Absolute: 0 10*3/uL (ref 0.0–0.1)
Basophils Relative: 1 %
Eosinophils Absolute: 0 10*3/uL (ref 0.0–0.5)
Eosinophils Relative: 1 %
HCT: 34.2 % — ABNORMAL LOW (ref 36.0–46.0)
Hemoglobin: 10.6 g/dL — ABNORMAL LOW (ref 12.0–15.0)
Immature Granulocytes: 0 %
Lymphocytes Relative: 21 %
Lymphs Abs: 0.7 10*3/uL (ref 0.7–4.0)
MCH: 21.2 pg — ABNORMAL LOW (ref 26.0–34.0)
MCHC: 31 g/dL (ref 30.0–36.0)
MCV: 68.5 fL — ABNORMAL LOW (ref 80.0–100.0)
Monocytes Absolute: 0.3 10*3/uL (ref 0.1–1.0)
Monocytes Relative: 8 %
Neutro Abs: 2.1 10*3/uL (ref 1.7–7.7)
Neutrophils Relative %: 69 %
Platelet Count: 209 10*3/uL (ref 150–400)
RBC: 4.99 MIL/uL (ref 3.87–5.11)
RDW: 17.5 % — ABNORMAL HIGH (ref 11.5–15.5)
WBC Count: 3.1 10*3/uL — ABNORMAL LOW (ref 4.0–10.5)
nRBC: 0 % (ref 0.0–0.2)

## 2023-04-25 LAB — LACTATE DEHYDROGENASE: LDH: 125 U/L (ref 98–192)

## 2023-04-25 LAB — FERRITIN: Ferritin: 19 ng/mL (ref 11–307)

## 2023-04-25 NOTE — Progress Notes (Signed)
[facility] [phone]   [phone]  OFFICE PROGRESS NOTE  Felicia Peng, MD [address] [place] [address]  DIAGNOSIS: Leukocytopenia and mild anemia.  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  PRIOR THERAPY: None  CURRENT THERAPY: Iron tablets with fusion +1 capsule p.o. daily  INTERVAL HISTORY: Felicia Garrett 67 y.o. female returns to the clinic today for follow-up visit.  The patient is feeling fine today with no concerning complaints.  She is enjoying her retirement but she does part-time virtual visits.  She denied having any current weight loss or night sweats.  She has no nausea, vomiting, diarrhea or constipation.  She has no headache or visual changes.  She denied having any chest pain, shortness of breath, cough or hemoptysis.  She is here today for evaluation and repeat blood work.  She does not take her iron supplement as prescribed but only few times a week.  MEDICAL HISTORY: Past Medical History:  Diagnosis Date   Anemia    Arthritis    Depression    Diabetes mellitus    Hyperlipidemia    Thyroid nodule     ALLERGIES:  is allergic to penicillins.  MEDICATIONS:  Current Outpatient Medications  Medication Sig Dispense Refill   Accu-Chek Softclix Lancets lancets Use as instructed 100 each 12   atorvastatin (LIPITOR) 40 MG tablet TAKE 1 TABLET BY MOUTH EVERY DAY 90 tablet 1   Blood Glucose Monitoring Suppl (ACCU-CHEK GUIDE) w/Device KIT USE AS DIRECTED TO CHECK BLOOD SUGAR. 1 kit 0   buPROPion (WELLBUTRIN XL) 150 MG 24 hr tablet TAKE 1 TABLET BY MOUTH EVERY DAY 90 tablet 2   ciprofloxacin-dexamethasone (CIPRODEX) OTIC suspension Place 4 drops into the left ear 2 (two) times daily. 7.5 mL 0   Dapagliflozin-metFORMIN HCl ER (XIGDUO XR) 01-999 MG TB24 TAKE 2 TABLETS BY  MOUTH EVERY MORNING 180 tablet 2   DRYSOL 20 % external solution Apply topically daily as needed. 35 mL 1   glucose blood test strip Use as instructed 100 each 12   Iron-FA-B Cmp-C-Biot-Probiotic (FUSION PLUS) CAPS TAKE 1 CAPSULE BY MOUTH ONCE DAILY BETWEEN MEALS 90 capsule 1   levothyroxine (SYNTHROID) 137 MCG tablet Take 1 tablet (137 mcg total) by mouth daily before breakfast. 90 tablet 2   losartan (COZAAR) 50 MG tablet TAKE 1 TABLET BY MOUTH EVERY DAY 90 tablet 1   meloxicam (MOBIC) 7.5 MG tablet Take 1 tablet (7.5 mg total) by mouth daily. 30 tablet 1   methocarbamol (ROBAXIN) 750 MG tablet TAKE 1 TABLET BY MOUTH 3 TIMES DAILY. 40 tablet 1   sodium fluoride (PREVIDENT 5000 PLUS) 1.1 % CREA dental cream Denta 5000 Plus 1.1 % cream  USE AS DIRECTED     tirzepatide (MOUNJARO) 12.5 MG/0.5ML Pen Inject 12.5 mg into the skin once a week. 2 mL 2   triamcinolone cream (KENALOG) 0.1 % Apply 1 Application topically 2 (two) times daily. 45 g 0   No current facility-administered medications for this visit.    SURGICAL HISTORY:  Past Surgical History:  Procedure Laterality Date   LAPAROSCOPIC GASTRIC BANDING  06/14/08   THYROIDECTOMY N/A 10/08/2014   Procedure: NEAR TOTAL THYROIDECTOMY;  Surgeon: Valarie Merino, MD;  Location: [facility];  Service: General;  Laterality: N/A;   UTERINE FIBROID EMBOLIZATION  10 yrs ago  REVIEW OF SYSTEMS:  A comprehensive review of systems was negative except for: Constitutional: positive for fatigue   PHYSICAL EXAMINATION: General appearance: alert, cooperative, and no distress Head: Normocephalic, without obvious abnormality, atraumatic Neck: no adenopathy, no JVD, supple, symmetrical, trachea midline, and thyroid not enlarged, symmetric, no tenderness/mass/nodules Lymph nodes: Cervical, supraclavicular, and axillary nodes normal. Resp: clear to auscultation bilaterally Back: symmetric, no curvature. ROM normal. No CVA tenderness. Cardio: regular rate and  rhythm, S1, S2 normal, no murmur, click, rub or gallop GI: soft, non-tender; bowel sounds normal; no masses,  no organomegaly Extremities: extremities normal, atraumatic, no cyanosis or edema  ECOG PERFORMANCE STATUS: 1 - Symptomatic but completely ambulatory  Blood pressure 102/87, pulse 89, temperature 97.9 F (36.6 C), temperature source Oral, resp. rate 18, height 5\' 9"  (1.753 m), weight 170 lb 9.6 oz (77.4 kg), SpO2 100%.  LABORATORY DATA: Lab Results  Component Value Date   WBC 3.1 (L) 04/25/2023   HGB 10.6 (L) 04/25/2023   HCT 34.2 (L) 04/25/2023   MCV 68.5 (L) 04/25/2023   PLT 209 04/25/2023      Chemistry      Component Value Date/Time   NA 142 04/11/2023 1200   K 3.7 04/11/2023 1200   CL 105 04/11/2023 1200   CO2 22 04/11/2023 1200   BUN 12 04/11/2023 1200   CREATININE 0.71 04/11/2023 1200   CREATININE 0.78 09/14/2021 1059      Component Value Date/Time   CALCIUM 9.4 04/11/2023 1200   ALKPHOS 52 11/26/2022 1220   AST 14 11/26/2022 1220   AST 15 09/14/2021 1059   ALT 16 04/11/2023 1200   ALT 14 09/14/2021 1059   BILITOT 0.7 11/26/2022 1220   BILITOT 0.6 09/14/2021 1059       RADIOGRAPHIC STUDIES: No results found.  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  The patient is currently on iron tablet with fusion plus and tolerating it fairly well.  She takes it only few times a week. I strongly recommend for her to take her fusion plus at least every other day.  Repeat CBC today showed the persistent leukocytopenia as well as a mild microcytic anemia. Her hemoglobin electrophoresis performed few months ago was unremarkable. I recommended for the patient to continue with the oral iron supplement for now. I will see her back for follow-up visit in 1 year for evaluation and  repeat blood work. She was advised to call immediately if she has any other concerning symptoms in the interval. The patient voices understanding of current disease status and treatment options and is in agreement with the current care plan.  All questions were answered. The patient knows to call the clinic with any problems, questions or concerns. We can certainly see the patient much sooner if necessary.  The total time spent in the appointment was 20 minutes.  Disclaimer: This note was dictated with voice recognition software. Similar sounding words can inadvertently be transcribed and may not be corrected upon review.

## 2023-05-09 ENCOUNTER — Ambulatory Visit: Payer: Medicare PPO | Admitting: Cardiology

## 2023-05-20 ENCOUNTER — Other Ambulatory Visit (HOSPITAL_COMMUNITY): Payer: Self-pay

## 2023-05-28 ENCOUNTER — Ambulatory Visit: Payer: Medicare PPO | Admitting: Cardiology

## 2023-06-10 ENCOUNTER — Encounter: Payer: Self-pay | Admitting: Cardiology

## 2023-06-10 ENCOUNTER — Ambulatory Visit: Payer: Medicare PPO | Admitting: Cardiology

## 2023-06-10 ENCOUNTER — Ambulatory Visit: Payer: Medicare PPO | Attending: Cardiology

## 2023-06-10 VITALS — BP 115/70 | HR 95 | Resp 16 | Ht 69.0 in | Wt 165.0 lb

## 2023-06-10 DIAGNOSIS — R002 Palpitations: Secondary | ICD-10-CM

## 2023-06-10 DIAGNOSIS — E1169 Type 2 diabetes mellitus with other specified complication: Secondary | ICD-10-CM | POA: Diagnosis not present

## 2023-06-10 DIAGNOSIS — I10 Essential (primary) hypertension: Secondary | ICD-10-CM

## 2023-06-10 DIAGNOSIS — E785 Hyperlipidemia, unspecified: Secondary | ICD-10-CM | POA: Diagnosis not present

## 2023-06-10 NOTE — Progress Notes (Unsigned)
Enrolled for [employer] to mail a ZIO XT long term holter monitor to the patients address on file.  

## 2023-06-10 NOTE — Progress Notes (Signed)
ID:  [name], [name] 06-14-1956, MRN [id number]  PCP:  Felicia Peng, MD  Cardiologist:  Tessa Lerner, DO, [facility] (established care 06/10/23)  REASON FOR CONSULT: Palpitations  REQUESTING PHYSICIAN:  Felicia Peng, MD [address] [place],  [place] [address]  Chief Complaint  Patient presents with   Palpitations   New Patient (Initial Visit)    HPI  Felicia Garrett is a 67 y.o. African-American female who presents to the clinic for evaluation of palpitations was at the request of Felicia Peng, MD. 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   Patient is referred to the practice for evaluation of palpitations/fast heart rate.  In the past she would appreciate episodes of palpitations that were infrequent but now they are no longer present.  However, she now wears a smart watch which alerts her for elevated heart rates.  Patient states that she could be just resting or doing her regular chores and has noticed heart rates as high as 120 bpm.  She usually sits down and symptoms improved.  If she did not wear her watch she would not be aware of her underlying episodes of tachycardia.  She has tried to keep herself well-hydrated and also has been addressing her chronic anemia which is at baseline.  No excessive use of caffeinated beverages. No consumption of alcohol, illicits, energy drinks, herbal supplements, over-the-counter medications which are new.  Denies anginal chest pain or heart failure symptoms  FUNCTIONAL STATUS: Hike 2-3 x / week, aerobic activity 2 or 3 times a week, cutting wood.    CARDIAC DATABASE: EKG: 06/10/2023: Sinus rhythm, 82 bpm, without underlying ischemic injury pattern  Echocardiogram: No results found for this or any previous visit from the past 1095 days.   Stress Testing: No results found for this or any previous visit from  the past 1095 days.  ALLERGIES: Allergies  Allergen Reactions   Penicillins Shortness Of Breath    CHEST PAIN    MEDICATION LIST PRIOR TO VISIT: Current Meds  Medication Sig   Accu-Chek Softclix Lancets lancets Use as instructed   atorvastatin (LIPITOR) 40 MG tablet TAKE 1 TABLET BY MOUTH EVERY DAY   Blood Glucose Monitoring Suppl (ACCU-CHEK GUIDE) w/Device KIT USE AS DIRECTED TO CHECK BLOOD SUGAR.   buPROPion (WELLBUTRIN XL) 150 MG 24 hr tablet TAKE 1 TABLET BY MOUTH EVERY DAY   Dapagliflozin-metFORMIN HCl ER (XIGDUO XR) 01-999 MG TB24 TAKE 2 TABLETS BY MOUTH EVERY MORNING   DRYSOL 20 % external solution Apply topically daily as needed.   glucose blood test strip Use as instructed   Iron-FA-B Cmp-C-Biot-Probiotic (FUSION PLUS) CAPS TAKE 1 CAPSULE BY MOUTH ONCE DAILY BETWEEN MEALS   levothyroxine (SYNTHROID) 137 MCG tablet Take 1 tablet (137 mcg total) by mouth daily before breakfast.   losartan (COZAAR) 50 MG tablet TAKE 1 TABLET BY MOUTH EVERY DAY   methocarbamol (ROBAXIN) 750 MG tablet TAKE 1 TABLET BY MOUTH 3 TIMES DAILY. (Patient taking differently: Take 750 mg by mouth every 6 (six) hours as needed.)   sodium fluoride (PREVIDENT 5000 PLUS) 1.1 % CREA dental cream Denta 5000 Plus 1.1 % cream  USE AS DIRECTED   tirzepatide (MOUNJARO) 12.5 MG/0.5ML Pen Inject 12.5 mg into the skin once a week.     PAST MEDICAL HISTORY: Past Medical History:  Diagnosis Date   Anemia    Arthritis    Depression    Diabetes  mellitus    Hyperlipidemia    Thyroid nodule     PAST SURGICAL HISTORY: Past Surgical History:  Procedure Laterality Date   LAPAROSCOPIC GASTRIC BANDING  06/14/08   THYROIDECTOMY N/A 10/08/2014   Procedure: NEAR TOTAL THYROIDECTOMY;  Surgeon: Valarie Merino, MD;  Location: [facility];  Service: General;  Laterality: N/A;   UTERINE FIBROID EMBOLIZATION  10 yrs ago    FAMILY HISTORY: The patient family history includes Diabetes in her brother and father; Hyperlipidemia in  her brother and mother; Hypertension in her brother and mother; Hyperthyroidism in her mother; Thyroid disease in her sister.  SOCIAL HISTORY:  The patient  reports that she has never smoked. She has never used smokeless tobacco. She reports that she does not drink alcohol and does not use drugs.  REVIEW OF SYSTEMS: Review of Systems  Cardiovascular:  Negative for chest pain, claudication, dyspnea on exertion, irregular heartbeat, leg swelling, near-syncope, orthopnea, palpitations, paroxysmal nocturnal dyspnea and syncope.  Respiratory:  Negative for shortness of breath.   Hematologic/Lymphatic: Negative for bleeding problem.  Musculoskeletal:  Negative for muscle cramps and myalgias.  Neurological:  Negative for dizziness and light-headedness.   PHYSICAL EXAM:    06/10/2023    1:11 PM 04/25/2023   10:33 AM 04/11/2023   11:04 AM  Vitals with BMI  Height 5\' 9"  5\' 9"  5\' 9"   Weight 165 lbs 170 lbs 10 oz 171 lbs 10 oz  BMI 24.36 25.18 25.33  Systolic 115 102 478  Diastolic 70 87 68  Pulse 95 89 62    Physical Exam  Constitutional: No distress.  Age appropriate, hemodynamically stable.   Neck: No JVD present.  Cardiovascular: Normal rate, regular rhythm, S1 normal, S2 normal, intact distal pulses and normal pulses. Exam reveals no gallop, no S3 and no S4.  No murmur heard. Pulmonary/Chest: Effort normal and breath sounds normal. No stridor. She has no wheezes. She has no rales.  Abdominal: Soft. Bowel sounds are normal. She exhibits no distension. There is no abdominal tenderness.  Musculoskeletal:        General: No edema.     Cervical back: Neck supple.  Neurological: She is alert and oriented to person, place, and time. She has intact cranial nerves (2-12).  Skin: Skin is warm and moist.   LABORATORY DATA:    Latest Ref Rng & Units 04/25/2023   10:16 AM 04/02/2022   10:40 AM 12/13/2021   11:02 AM  CBC  WBC 4.0 - 10.5 K/uL 3.1  3.4  3.0   Hemoglobin 12.0 - 15.0 g/dL 29.5  62.1   30.8   Hematocrit 36.0 - 46.0 % 34.2  37.6  38.1   Platelets 150 - 400 K/uL 209  262  288        Latest Ref Rng & Units 04/11/2023   12:00 PM 11/26/2022   12:20 PM 07/24/2022   11:47 AM  CMP  Glucose 70 - 99 mg/dL 657  98  92   BUN 8 - 27 mg/dL 12  9  7    Creatinine 0.57 - 1.00 mg/dL 8.46  9.62  9.52   Sodium 134 - 144 mmol/L 142  138  139   Potassium 3.5 - 5.2 mmol/L 3.7  4.3  4.1   Chloride 96 - 106 mmol/L 105  102  101   CO2 20 - 29 mmol/L 22  22  24    Calcium 8.7 - 10.3 mg/dL 9.4  9.3  9.3   Total Protein 6.0 -  8.5 g/dL  6.4    Total Bilirubin 0.0 - 1.2 mg/dL  0.7    Alkaline Phos 44 - 121 IU/L  52    AST 0 - 40 IU/L  14    ALT 0 - 32 IU/L 16  12      Lab Results  Component Value Date   CHOL 133 04/11/2023   HDL 67 04/11/2023   LDLCALC 55 04/11/2023   TRIG 44 04/11/2023   CHOLHDL 2.0 04/11/2023   No components found for: "NTPROBNP" No results for input(s): "PROBNP" in the last 8760 hours. Recent Labs    11/26/22 1220 04/11/23 1200  TSH 1.830 3.040    BMP Recent Labs    07/24/22 1147 11/26/22 1220 04/11/23 1200  NA 139 138 142  K 4.1 4.3 3.7  CL 101 102 105  CO2 24 22 22   GLUCOSE 92 98 102*  BUN 7* 9 12  CREATININE 0.81 0.81 0.71  CALCIUM 9.3 9.3 9.4    HEMOGLOBIN A1C Lab Results  Component Value Date   HGBA1C 7.0 (H) 04/11/2023    IMPRESSION:    ICD-10-CM   1. Palpitations  R00.2 EKG 12-Lead    LONG TERM MONITOR (3-14 DAYS)    2. Benign hypertension  I10 PCV ECHOCARDIOGRAM COMPLETE    3. Type 2 diabetes mellitus with hyperlipidemia (HCC)  E11.69 CT CARDIAC SCORING (SELF PAY ONLY)   E78.5        RECOMMENDATIONS: Gemma Leggio is a 67 y.o. 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   Palpitations No identifiable reversible cause. Known history of anemia and thyroid disease. Currently monitors her heart  rate via smart watch.  No notifications of atrial fibrillation as per her smart watch. 2-week Zio patch to evaluate for dysrhythmias  Benign hypertension Office blood pressures are very well-controlled. Continue current medical therapy  Type 2 diabetes mellitus with hyperlipidemia (HCC) Hemoglobin A1c is well-controlled. Medications reconciled. Estimated 10-year risk of ASCVD based on current parameters is approximately 14%. Shared decision was to proceed with additional testing-recommend echo and coronary calcium score.  Further recommendations to follow.  FINAL MEDICATION LIST END OF ENCOUNTER: No orders of the defined types were placed in this encounter.   Medications Discontinued During This Encounter  Medication Reason   meloxicam (MOBIC) 7.5 MG tablet    triamcinolone cream (KENALOG) 0.1 %    ciprofloxacin-dexamethasone (CIPRODEX) OTIC suspension      Current Outpatient Medications:    Accu-Chek Softclix Lancets lancets, Use as instructed, Disp: 100 each, Rfl: 12   atorvastatin (LIPITOR) 40 MG tablet, TAKE 1 TABLET BY MOUTH EVERY DAY, Disp: 90 tablet, Rfl: 1   Blood Glucose Monitoring Suppl (ACCU-CHEK GUIDE) w/Device KIT, USE AS DIRECTED TO CHECK BLOOD SUGAR., Disp: 1 kit, Rfl: 0   buPROPion (WELLBUTRIN XL) 150 MG 24 hr tablet, TAKE 1 TABLET BY MOUTH EVERY DAY, Disp: 90 tablet, Rfl: 2   Dapagliflozin-metFORMIN HCl ER (XIGDUO XR) 01-999 MG TB24, TAKE 2 TABLETS BY MOUTH EVERY MORNING, Disp: 180 tablet, Rfl: 2   DRYSOL 20 % external solution, Apply topically daily as needed., Disp: 35 mL, Rfl: 1   glucose blood test strip, Use as instructed, Disp: 100 each, Rfl: 12   Iron-FA-B Cmp-C-Biot-Probiotic (FUSION PLUS) CAPS, TAKE 1 CAPSULE BY MOUTH ONCE DAILY BETWEEN MEALS, Disp: 90 capsule, Rfl: 1   levothyroxine (SYNTHROID) 137 MCG tablet, Take 1 tablet (137 mcg total)  by mouth daily before breakfast., Disp: 90 tablet, Rfl: 2   losartan (COZAAR) 50 MG tablet, TAKE 1 TABLET BY MOUTH  EVERY DAY, Disp: 90 tablet, Rfl: 1   methocarbamol (ROBAXIN) 750 MG tablet, TAKE 1 TABLET BY MOUTH 3 TIMES DAILY. (Patient taking differently: Take 750 mg by mouth every 6 (six) hours as needed.), Disp: 40 tablet, Rfl: 1   sodium fluoride (PREVIDENT 5000 PLUS) 1.1 % CREA dental cream, Denta 5000 Plus 1.1 % cream  USE AS DIRECTED, Disp: , Rfl:    tirzepatide (MOUNJARO) 12.5 MG/0.5ML Pen, Inject 12.5 mg into the skin once a week., Disp: 2 mL, Rfl: 2  Orders Placed This Encounter  Procedures   CT CARDIAC SCORING (SELF PAY ONLY)   LONG TERM MONITOR (3-14 DAYS)   EKG 12-Lead   PCV ECHOCARDIOGRAM COMPLETE    There are no Patient Instructions on file for this visit.   --Continue cardiac medications as reconciled in final medication list. --Return in about 8 weeks (around 08/05/2023) for Follow up, Palpitations. or sooner if needed. --Continue follow-up with your primary care physician regarding the management of your other chronic comorbid conditions.  Patient's questions and concerns were addressed to her satisfaction. She voices understanding of the instructions provided during this encounter.   This note was created using a voice recognition software as a result there may be grammatical errors inadvertently enclosed that do not reflect the nature of this encounter. Every attempt is made to correct such errors.  Tessa Lerner, [place], [facility]  Pager:  [phone] Office: [phone]

## 2023-06-13 ENCOUNTER — Other Ambulatory Visit: Payer: Self-pay | Admitting: Internal Medicine

## 2023-06-13 ENCOUNTER — Other Ambulatory Visit (HOSPITAL_COMMUNITY): Payer: Self-pay

## 2023-07-01 ENCOUNTER — Ambulatory Visit (HOSPITAL_BASED_OUTPATIENT_CLINIC_OR_DEPARTMENT_OTHER)
Admission: RE | Admit: 2023-07-01 | Discharge: 2023-07-01 | Disposition: A | Discharge status: Home / Self Care | Payer: Medicare PPO | Source: Ambulatory Visit | Attending: Cardiology | Admitting: Cardiology

## 2023-07-01 DIAGNOSIS — R002 Palpitations: Secondary | ICD-10-CM

## 2023-07-01 DIAGNOSIS — E1169 Type 2 diabetes mellitus with other specified complication: Secondary | ICD-10-CM | POA: Insufficient documentation

## 2023-07-01 DIAGNOSIS — E785 Hyperlipidemia, unspecified: Secondary | ICD-10-CM | POA: Insufficient documentation

## 2023-07-03 ENCOUNTER — Telehealth: Payer: Self-pay | Admitting: Cardiology

## 2023-07-03 NOTE — Telephone Encounter (Signed)
Patient stated she received a bill of $40.00 which she paid as co-pay for her last visit at [facility].

## 2023-07-07 ENCOUNTER — Other Ambulatory Visit: Payer: Self-pay | Admitting: Internal Medicine

## 2023-07-07 ENCOUNTER — Other Ambulatory Visit: Payer: Self-pay | Admitting: Physician Assistant

## 2023-07-07 DIAGNOSIS — L249 Irritant contact dermatitis, unspecified cause: Secondary | ICD-10-CM

## 2023-07-08 ENCOUNTER — Ambulatory Visit: Payer: Medicare PPO | Admitting: Physician Assistant

## 2023-07-08 ENCOUNTER — Encounter: Payer: Self-pay | Admitting: Physician Assistant

## 2023-07-08 ENCOUNTER — Other Ambulatory Visit: Payer: Self-pay

## 2023-07-08 VITALS — BP 101/67 | HR 110 | Temp 98.5°F | Ht 69.0 in | Wt 162.0 lb

## 2023-07-08 DIAGNOSIS — A09 Infectious gastroenteritis and colitis, unspecified: Secondary | ICD-10-CM | POA: Diagnosis not present

## 2023-07-08 MED ORDER — CIPROFLOXACIN HCL 500 MG PO TABS
500.0000 mg | ORAL_TABLET | Freq: Two times a day (BID) | ORAL | 0 refills | Status: AC
Start: 2023-07-08 — End: 2023-07-11

## 2023-07-08 NOTE — Patient Instructions (Signed)
You are going to take Cipro twice a day for 3 days.  I encourage you to stay very well-hydrated and get plenty of rest.  We will call you with today's lab results.   Roney Jaffe, PA-C Physician Assistant [facility] [email or web address]   Diarrhea, Adult Diarrhea is frequent loose and sometimes watery bowel movements. Diarrhea can make you feel weak and cause you to become dehydrated. Dehydration is a condition in which there is not enough water or other fluids in the body. Dehydration can make you tired and thirsty, cause you to have a dry mouth, and decrease how often you urinate. Diarrhea typically lasts 2-3 days. However, it can last longer if it is a sign of something more serious. It is important to treat your diarrhea as told by your health care provider. Follow these instructions at home: Eating and drinking     Follow these recommendations as told by your health care provider: Take an oral rehydration solution (ORS). This is an over-the-counter medicine that helps return your body to its normal balance of nutrients and water. It is found at pharmacies and retail stores. Drink enough fluid to keep your urine pale yellow. Drink fluids such as water, diluted fruit juice, and low-calorie sports drinks. You can drink milk also, if desired. Sucking on ice chips is another way to get fluids. Avoid drinking fluids that contain a lot of sugar or caffeine, such as soda, energy drinks, and regular sports drinks. Avoid alcohol. Eat bland, easy-to-digest foods in small amounts as you are able. These foods include bananas, applesauce, rice, lean meats, toast, and crackers. Avoid spicy or fatty foods.  Medicines Take over-the-counter and prescription medicines only as told by your health care provider. If you were prescribed antibiotics, take them as told by your health care provider. Do not stop using the antibiotic even if you start  to feel better. General instructions  Wash your hands often using soap and water for at least 20 seconds. If soap and water are not available, use hand sanitizer. Others in the household should wash their hands as well. Hands should be washed: After using the toilet or changing a diaper. Before preparing, cooking, or serving food. While caring for a sick person or while visiting someone in a hospital. Rest at home while you recover. Take a warm bath to relieve any burning or pain from frequent diarrhea episodes. Watch your condition for any changes. Contact a health care provider if: You have a fever. Your diarrhea gets worse. You have new symptoms. You vomit every time you eat or drink. You feel light-headed, dizzy, or have a headache. You have muscle cramps. You have signs of dehydration, such as: Dark urine, very little urine, or no urine. Cracked lips. Dry mouth. Sunken eyes. Sleepiness. Weakness. You have bloody or black stools or stools that look like tar. You have severe pain, cramping, or bloating in your abdomen. Your skin feels cold and clammy. You feel confused. Get help right away if: You have chest pain or your heart is beating very quickly. You have trouble breathing or you are breathing very quickly. You feel extremely weak or you faint. These symptoms may be an emergency. Get help right away. Call 911. Do not wait to see if the symptoms will go away. Do not drive yourself to the hospital. This information is not intended to replace advice given to you by your health care provider. Make sure you discuss any questions you have with your  health care provider. Document Revised: 02/27/2022 Document Reviewed: 02/27/2022 Elsevier Patient Education  2024 [employer].

## 2023-07-08 NOTE — Progress Notes (Signed)
Established Patient Office Visit  Subjective   Patient ID: Felicia Garrett, female    DOB: 09/27/1955  Age: 67 y.o. MRN: [id number]  Chief Complaint  Patient presents with   Diarrhea    Symptoms consistent x1 week,recently traveled to [place] for medical screening, she return with symptoms of weakness, fatigue, Abdominal cramping and loose stool.     States that she has been experiencing episodes of diarrhea since returning from a trip to [place] 1 week ago.  States that she did have several soft stools near the end of her trip as well.  States that she does feel are improving slightly, however today also was being seen weakness to water the community garden.  States that she also was experiencing abdominal discomfort yesterday, took some Pepcid and antimedication with small amount of relief.  States no abdominal discomfort today.  Denies nausea or vomiting, denies fever, no one else on trip with symptoms . denies nighttime awakenings  States that she has been drinking water and tea, but does endorse that she needs to increase her hydration.        Past Medical History:  Diagnosis Date   Anemia    Arthritis    Depression    Diabetes mellitus    Hyperlipidemia    Thyroid nodule    Social History   Socioeconomic History   Marital status: Single    Spouse name: Not on file   Number of children: Not on file   Years of education: Not on file   Highest education level: Not on file  Occupational History   Not on file  Tobacco Use   Smoking status: Never   Smokeless tobacco: Never  Vaping Use   Vaping status: Never Used  Substance and Sexual Activity   Alcohol use: No   Drug use: No   Sexual activity: Never    Comment: meno   Other Topics Concern   Not on file  Social History Narrative   Not on file   Social Determinants of Health   Financial Resource Strain: Low Risk  (10/10/2022)   Overall Financial Resource Strain (CARDIA)    Difficulty of Paying Living Expenses:  Not hard at all  Food Insecurity: No Food Insecurity (10/10/2022)   Hunger Vital Sign    Worried About Running Out of Food in the Last Year: Never true    Ran Out of Food in the Last Year: Never true  Transportation Needs: No Transportation Needs (10/10/2022)   PRAPARE - Administrator, Civil Service (Medical): No    Lack of Transportation (Non-Medical): No  Physical Activity: Sufficiently Active (10/10/2022)   Exercise Vital Sign    Days of Exercise per Week: 3 days    Minutes of Exercise per Session: 60 min  Stress: No Stress Concern Present (10/10/2022)   [employer] of Occupational Health - Occupational Stress Questionnaire    Feeling of Stress : Not at all  Social Connections: Not on file  Intimate Partner Violence: Not on file   Family History  Problem Relation Age of Onset   Hypertension Mother    Hyperlipidemia Mother    Hyperthyroidism Mother    Diabetes Father    Thyroid disease Sister    Hyperlipidemia Brother    Hypertension Brother    Diabetes Brother    Allergies  Allergen Reactions   Penicillins Shortness Of Breath    CHEST PAIN    Review of Systems  Constitutional:  Negative for chills and fever.  HENT: Negative.    Eyes: Negative.   Respiratory:  Negative for shortness of breath.   Cardiovascular:  Negative for chest pain.  Gastrointestinal:  Positive for diarrhea. Negative for blood in stool, nausea and vomiting.  Genitourinary: Negative.   Musculoskeletal: Negative.   Skin: Negative.   Neurological: Negative.   Endo/Heme/Allergies: Negative.   Psychiatric/Behavioral: Negative.        Objective:     BP 101/67 (BP Location: Left Arm, Patient Position: Sitting, Cuff Size: Large)   Pulse (!) 110   Temp 98.5 F (36.9 C) (Temporal)   Ht 5\' 9"  (1.753 m)   Wt 162 lb (73.5 kg)   BMI 23.92 kg/m  BP Readings from Last 3 Encounters:  07/08/23 101/67  06/10/23 115/70  04/25/23 102/87   Wt Readings from Last 3 Encounters:   07/08/23 162 lb (73.5 kg)  06/10/23 165 lb (74.8 kg)  04/25/23 170 lb 9.6 oz (77.4 kg)    Physical Exam Vitals and nursing note reviewed.  Constitutional:      Appearance: Normal appearance.  HENT:     Head: Normocephalic and atraumatic.     Right Ear: External ear normal.     Left Ear: External ear normal.     Nose: Nose normal.     Mouth/Throat:     Mouth: Mucous membranes are moist.     Pharynx: Oropharynx is clear.  Eyes:     Extraocular Movements: Extraocular movements intact.     Conjunctiva/sclera: Conjunctivae normal.     Pupils: Pupils are equal, round, and reactive to light.  Cardiovascular:     Rate and Rhythm: Regular rhythm. Tachycardia present.  Pulmonary:     Effort: Pulmonary effort is normal.     Breath sounds: Normal breath sounds.  Musculoskeletal:        General: Normal range of motion.     Cervical back: Normal range of motion and neck supple.  Skin:    General: Skin is warm and dry.  Neurological:     General: No focal deficit present.     Mental Status: She is alert and oriented to person, place, and time.  Psychiatric:        Mood and Affect: Mood normal.        Behavior: Behavior normal.        Thought Content: Thought content normal.        Judgment: Judgment normal.         Assessment & Plan:   Problem List Items Addressed This Visit   None Visit Diagnoses     Traveler's diarrhea    -  Primary   Relevant Medications   ciprofloxacin (CIPRO) 500 MG tablet   Other Relevant Orders   Basic metabolic panel      1. Traveler's diarrhea Trial Cipro.  Patient education given on supportive care, red flags given for prompt reevaluation. - ciprofloxacin (CIPRO) 500 MG tablet; Take 1 tablet (500 mg total) by mouth 2 (two) times daily for 3 days.  Dispense: 6 tablet; Refill: 0 - Basic metabolic panel   I have reviewed the patient's medical history (PMH, PSH, Social History, Family History, Medications, and allergies) , and have been  updated if relevant. I spent 30 minutes reviewing chart and  face to face time with patient.    Return if symptoms worsen or fail to improve.    Kasandra Knudsen Mayers, PA-C

## 2023-07-09 LAB — BASIC METABOLIC PANEL
BUN/Creatinine Ratio: 9 — ABNORMAL LOW (ref 12–28)
BUN: 7 mg/dL — ABNORMAL LOW (ref 8–27)
CO2: 20 mmol/L (ref 20–29)
Calcium: 9.2 mg/dL (ref 8.7–10.3)
Chloride: 103 mmol/L (ref 96–106)
Creatinine, Ser: 0.81 mg/dL (ref 0.57–1.00)
Glucose: 137 mg/dL — ABNORMAL HIGH (ref 70–99)
Potassium: 3.9 mmol/L (ref 3.5–5.2)
Sodium: 141 mmol/L (ref 134–144)
eGFR: 79 mL/min/{1.73_m2} (ref >59)

## 2023-07-12 ENCOUNTER — Telehealth: Payer: Medicare PPO | Admitting: Physician Assistant

## 2023-07-12 DIAGNOSIS — A09 Infectious gastroenteritis and colitis, unspecified: Secondary | ICD-10-CM | POA: Diagnosis not present

## 2023-07-12 DIAGNOSIS — K529 Noninfective gastroenteritis and colitis, unspecified: Secondary | ICD-10-CM

## 2023-07-12 MED ORDER — SUCRALFATE 1 G PO TABS
1.0000 g | ORAL_TABLET | Freq: Three times a day (TID) | ORAL | 0 refills | Status: DC
Start: 2023-07-12 — End: 2023-10-09

## 2023-07-12 MED ORDER — METRONIDAZOLE 500 MG PO TABS
500.0000 mg | ORAL_TABLET | Freq: Three times a day (TID) | ORAL | 0 refills | Status: AC
Start: 2023-07-12 — End: 2023-07-19

## 2023-07-12 NOTE — Progress Notes (Signed)
Virtual Visit Consent   Felicia Garrett, you are scheduled for a virtual visit with a [name] provider today. Just as with appointments in the office, your consent must be obtained to participate. Your consent will be active for this visit and any virtual visit you may have with one of our providers in the next 365 days. If you have a MyChart account, a copy of this consent can be sent to you electronically.  As this is a virtual visit, video technology does not allow for your provider to perform a traditional examination. This may limit your provider's ability to fully assess your condition. If your provider identifies any concerns that need to be evaluated in person or the need to arrange testing (such as labs, EKG, etc.), we will make arrangements to do so. Although advances in technology are sophisticated, we cannot ensure that it will always work on either your end or our end. If the connection with a video visit is poor, the visit may have to be switched to a telephone visit. With either a video or telephone visit, we are not always able to ensure that we have a secure connection.  By engaging in this virtual visit, you consent to the provision of healthcare and authorize for your insurance to be billed (if applicable) for the services provided during this visit. Depending on your insurance coverage, you may receive a charge related to this service.  I need to obtain your verbal consent now. Are you willing to proceed with your visit today? Felicia Garrett has provided verbal consent on 07/12/2023 for a virtual visit (video or telephone). Margaretann Loveless, PA-C  Date: 07/12/2023 4:18 PM  Virtual Visit via Video Note   IMargaretann Loveless, connected with  Felicia Garrett  ([id number], 1956/08/18) on 07/12/23 at  4:15 PM EDT by a video-enabled telemedicine application and verified that I am speaking with the correct person using two identifiers.  Location: Patient: Virtual Visit Location  Patient: Home Provider: Virtual Visit Location Provider: Home Office   I discussed the limitations of evaluation and management by telemedicine and the availability of in person appointments. The patient expressed understanding and agreed to proceed.    History of Present Illness: Felicia Garrett is a 67 y.o. who identifies as a female who was assigned female at birth, and is being seen today for continued GI symptoms.  HPI: GI Problem The primary symptoms include fatigue, abdominal pain and diarrhea (looser stool). Primary symptoms do not include fever, nausea, vomiting, melena, jaundice or hematochezia. The illness began more than 7 days ago. The onset was sudden. The problem has been gradually improving.  The illness is also significant for bloating. The illness does not include chills, constipation, back pain or itching. Associated symptoms comments: Decreased appetite. Risk factors for a gastrointestinal illness include travel to endemic areas.  Went to [facility] on 07/08/23 and diagnosed with Traveler diarrhea, treated with Cipro 500mg  BID x 3 days. Improved some.  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  Problems:  Patient Active Problem List   Diagnosis Date Noted   Estrogen deficiency 04/15/2023   Palpitation 04/15/2023   Impacted cerumen of left ear 04/15/2023   Essential hypertension, benign 04/11/2023   Left ear pain 04/11/2023   Encounter for counseling 01/01/2023   Allergic rhinitis with postnasal drip 03/19/2022   Overweight with body mass index (BMI) of 25 to 25.9 in adult 03/19/2022  Leukopenia 09/14/2021   Uncontrolled type 2 diabetes mellitus with hyperglycemia (HCC) 06/01/2019   Cervical high risk HPV (human papillomavirus) test positive 08/28/2018   Abnormal cervical Papanicolaou smear 08/28/2018   Bacterial vaginosis 08/28/2018   Hyperlipidemia associated with type 2 diabetes mellitus (HCC)  08/28/2018   Goiter 08/28/2018   Anemia 07/03/2018   Type 2 diabetes mellitus with hyperlipidemia (HCC) 07/03/2018   Postsurgical hypothyroidism 10/08/2014   Thyroid nodule 10/20/2012   Obesity status post 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 09/05/2011    Allergies:  Allergies  Allergen Reactions   Penicillins Shortness Of Breath    CHEST PAIN   Medications:  Current Outpatient Medications:    metroNIDAZOLE (FLAGYL) 500 MG tablet, Take 1 tablet (500 mg total) by mouth 3 (three) times daily for 7 days., Disp: 21 tablet, Rfl: 0   sucralfate (CARAFATE) 1 g tablet, Take 1 tablet (1 g total) by mouth 4 (four) times daily -  with meals and at bedtime., Disp: 28 tablet, Rfl: 0   Accu-Chek Softclix Lancets lancets, Use as instructed, Disp: 100 each, Rfl: 12   atorvastatin (LIPITOR) 40 MG tablet, TAKE 1 TABLET BY MOUTH EVERY DAY, Disp: 90 tablet, Rfl: 1   Blood Glucose Monitoring Suppl (ACCU-CHEK GUIDE) w/Device KIT, USE AS DIRECTED TO CHECK BLOOD SUGAR., Disp: 1 kit, Rfl: 0   buPROPion (WELLBUTRIN XL) 150 MG 24 hr tablet, TAKE 1 TABLET BY MOUTH EVERY DAY, Disp: 90 tablet, Rfl: 2   Dapagliflozin-metFORMIN HCl ER (XIGDUO XR) 01-999 MG TB24, TAKE 2 TABLETS BY MOUTH EVERY MORNING, Disp: 180 tablet, Rfl: 2   DRYSOL 20 % external solution, Apply topically daily as needed., Disp: 35 mL, Rfl: 1   glucose blood test strip, Use as instructed, Disp: 100 each, Rfl: 12   Iron-FA-B Cmp-C-Biot-Probiotic (FUSION PLUS) CAPS, TAKE 1 CAPSULE BY MOUTH ONCE DAILY BETWEEN MEALS, Disp: 90 capsule, Rfl: 1   levothyroxine (SYNTHROID) 137 MCG tablet, Take 1 tablet (137 mcg total) by mouth daily before breakfast., Disp: 90 tablet, Rfl: 2   losartan (COZAAR) 50 MG tablet, TAKE 1 TABLET BY MOUTH EVERY DAY, Disp: 90 tablet, Rfl: 1   methocarbamol (ROBAXIN) 750 MG tablet, TAKE 1 TABLET BY MOUTH 3 TIMES DAILY. (Patient taking differently: Take 750 mg by mouth every 6 (six) hours as needed.), Disp: 40 tablet, Rfl: 1   sodium fluoride (PREVIDENT 5000  PLUS) 1.1 % CREA dental cream, Denta 5000 Plus 1.1 % cream  USE AS DIRECTED, Disp: , Rfl:    tirzepatide (MOUNJARO) 12.5 MG/0.5ML Pen, Inject 12.5 mg into the skin once a week., Disp: 2 mL, Rfl: 2  Observations/Objective: Patient is well-developed, well-nourished in no acute distress.  Resting comfortably at home.  Head is normocephalic, atraumatic.  No labored breathing.  Speech is clear and coherent with logical content.  Patient is alert and oriented at baseline.    Assessment and Plan: 1. Traveler's diarrhea - metroNIDAZOLE (FLAGYL) 500 MG tablet; Take 1 tablet (500 mg total) by mouth 3 (three) times daily for 7 days.  Dispense: 21 tablet; Refill: 0 - sucralfate (CARAFATE) 1 g tablet; Take 1 tablet (1 g total) by mouth 4 (four) times daily -  with meals and at bedtime.  Dispense: 28 tablet; Refill: 0  2. Gastroenteritis - metroNIDAZOLE (FLAGYL) 500 MG tablet; Take 1 tablet (500 mg total) by mouth 3 (three) times daily for 7 days.  Dispense: 21 tablet; Refill: 0 - sucralfate (CARAFATE) 1 g tablet; Take 1 tablet (1 g total) by mouth 4 (four)  times daily -  with meals and at bedtime.  Dispense: 28 tablet; Refill: 0  - Will add Flagyl for potential parasitic gastroenteritis - Sucralfate for inflammation - Push fluids; electrolyte beverages - Increase dietary fibers - Imodium if needed - Seek in person evaluation for official stool cultures if not improving or if symptoms worsen  Follow Up Instructions: I discussed the assessment and treatment plan with the patient. The patient was provided an opportunity to ask questions and all were answered. The patient agreed with the plan and demonstrated an understanding of the instructions.  A copy of instructions were sent to the patient via MyChart unless otherwise noted below.    The patient was advised to call back or seek an in-person evaluation if the symptoms worsen or if the condition fails to improve as anticipated.    Margaretann Loveless, PA-C

## 2023-07-12 NOTE — Patient Instructions (Signed)
[name], thank you for joining Margaretann Loveless, PA-C for today's virtual visit.  While this provider is not your primary care provider (PCP), if your PCP is located in our provider database this encounter information will be shared with them immediately following your visit.   A [facility] MyChart account gives you access to today's visit and all your visits, tests, and labs performed at [facility] " click here if you don't have a [facility] MyChart account or go to [email or web address]  Consent: (Patient) Felicia Garrett provided verbal consent for this virtual visit at the beginning of the encounter.  Current Medications:  Current Outpatient Medications:    metroNIDAZOLE (FLAGYL) 500 MG tablet, Take 1 tablet (500 mg total) by mouth 3 (three) times daily for 7 days., Disp: 21 tablet, Rfl: 0   sucralfate (CARAFATE) 1 g tablet, Take 1 tablet (1 g total) by mouth 4 (four) times daily -  with meals and at bedtime., Disp: 28 tablet, Rfl: 0   Accu-Chek Softclix Lancets lancets, Use as instructed, Disp: 100 each, Rfl: 12   atorvastatin (LIPITOR) 40 MG tablet, TAKE 1 TABLET BY MOUTH EVERY DAY, Disp: 90 tablet, Rfl: 1   Blood Glucose Monitoring Suppl (ACCU-CHEK GUIDE) w/Device KIT, USE AS DIRECTED TO CHECK BLOOD SUGAR., Disp: 1 kit, Rfl: 0   buPROPion (WELLBUTRIN XL) 150 MG 24 hr tablet, TAKE 1 TABLET BY MOUTH EVERY DAY, Disp: 90 tablet, Rfl: 2   Dapagliflozin-metFORMIN HCl ER (XIGDUO XR) 01-999 MG TB24, TAKE 2 TABLETS BY MOUTH EVERY MORNING, Disp: 180 tablet, Rfl: 2   DRYSOL 20 % external solution, Apply topically daily as needed., Disp: 35 mL, Rfl: 1   glucose blood test strip, Use as instructed, Disp: 100 each, Rfl: 12   Iron-FA-B Cmp-C-Biot-Probiotic (FUSION PLUS) CAPS, TAKE 1 CAPSULE BY MOUTH ONCE DAILY BETWEEN MEALS, Disp: 90 capsule, Rfl: 1   levothyroxine (SYNTHROID) 137 MCG tablet, Take 1 tablet (137 mcg total) by mouth daily before breakfast., Disp: 90 tablet, Rfl:  2   losartan (COZAAR) 50 MG tablet, TAKE 1 TABLET BY MOUTH EVERY DAY, Disp: 90 tablet, Rfl: 1   methocarbamol (ROBAXIN) 750 MG tablet, TAKE 1 TABLET BY MOUTH 3 TIMES DAILY. (Patient taking differently: Take 750 mg by mouth every 6 (six) hours as needed.), Disp: 40 tablet, Rfl: 1   sodium fluoride (PREVIDENT 5000 PLUS) 1.1 % CREA dental cream, Denta 5000 Plus 1.1 % cream  USE AS DIRECTED, Disp: , Rfl:    tirzepatide (MOUNJARO) 12.5 MG/0.5ML Pen, Inject 12.5 mg into the skin once a week., Disp: 2 mL, Rfl: 2   Medications ordered in this encounter:  Meds ordered this encounter  Medications   metroNIDAZOLE (FLAGYL) 500 MG tablet    Sig: Take 1 tablet (500 mg total) by mouth 3 (three) times daily for 7 days.    Dispense:  21 tablet    Refill:  0    Order Specific Question:   Supervising Provider    Answer:   Merrilee Jansky [[id number]]   sucralfate (CARAFATE) 1 g tablet    Sig: Take 1 tablet (1 g total) by mouth 4 (four) times daily -  with meals and at bedtime.    Dispense:  28 tablet    Refill:  0    Order Specific Question:   Supervising Provider    Answer:   Merrilee Jansky [id number]     *If you need refills on other medications prior to your next appointment, please contact your  pharmacy*  Follow-Up: Call back or seek an in-person evaluation if the symptoms worsen or if the condition fails to improve as anticipated.  [facility] [phone]  Other Instructions Diarrhea, Adult Diarrhea is frequent loose and sometimes watery bowel movements. Diarrhea can make you feel weak and cause you to become dehydrated. Dehydration is a condition in which there is not enough water or other fluids in the body. Dehydration can make you tired and thirsty, cause you to have a dry mouth, and decrease how often you urinate. Diarrhea typically lasts 2-3 days. However, it can last longer if it is a sign of something more serious. It is important to treat your diarrhea as told by your  health care provider. Follow these instructions at home: Eating and drinking     Follow these recommendations as told by your health care provider: Take an oral rehydration solution (ORS). This is an over-the-counter medicine that helps return your body to its normal balance of nutrients and water. It is found at pharmacies and retail stores. Drink enough fluid to keep your urine pale yellow. Drink fluids such as water, diluted fruit juice, and low-calorie sports drinks. You can drink milk also, if desired. Sucking on ice chips is another way to get fluids. Avoid drinking fluids that contain a lot of sugar or caffeine, such as soda, energy drinks, and regular sports drinks. Avoid alcohol. Eat bland, easy-to-digest foods in small amounts as you are able. These foods include bananas, applesauce, rice, lean meats, toast, and crackers. Avoid spicy or fatty foods.  Medicines Take over-the-counter and prescription medicines only as told by your health care provider. If you were prescribed antibiotics, take them as told by your health care provider. Do not stop using the antibiotic even if you start to feel better. General instructions  Wash your hands often using soap and water for at least 20 seconds. If soap and water are not available, use hand sanitizer. Others in the household should wash their hands as well. Hands should be washed: After using the toilet or changing a diaper. Before preparing, cooking, or serving food. While caring for a sick person or while visiting someone in a hospital. Rest at home while you recover. Take a warm bath to relieve any burning or pain from frequent diarrhea episodes. Watch your condition for any changes. Contact a health care provider if: You have a fever. Your diarrhea gets worse. You have new symptoms. You vomit every time you eat or drink. You feel light-headed, dizzy, or have a headache. You have muscle cramps. You have signs of dehydration,  such as: Dark urine, very little urine, or no urine. Cracked lips. Dry mouth. Sunken eyes. Sleepiness. Weakness. You have bloody or black stools or stools that look like tar. You have severe pain, cramping, or bloating in your abdomen. Your skin feels cold and clammy. You feel confused. Get help right away if: You have chest pain or your heart is beating very quickly. You have trouble breathing or you are breathing very quickly. You feel extremely weak or you faint. These symptoms may be an emergency. Get help right away. Call 911. Do not wait to see if the symptoms will go away. Do not drive yourself to the hospital. This information is not intended to replace advice given to you by your health care provider. Make sure you discuss any questions you have with your health care provider. Document Revised: 02/27/2022 Document Reviewed: 02/27/2022 Elsevier Patient Education  2024 Elsevier  Inc.    If you have been instructed to have an in-person evaluation today at a local Urgent Care facility, please use the link below. It will take you to a list of all of our available [facility], including address, phone number and hours of operation. Please do not delay care.  [facility]  If you or a family member do not have a primary care provider, use the link below to schedule a visit and establish care. When you choose a [facility] primary care physician or advanced practice provider, you gain a long-term partner in health. Find a Primary Care Provider  Learn more about [facility]'s in-office and virtual care options: [facility] - Get Care Now

## 2023-07-19 ENCOUNTER — Other Ambulatory Visit (HOSPITAL_COMMUNITY): Payer: Self-pay

## 2023-07-19 ENCOUNTER — Other Ambulatory Visit: Payer: Self-pay | Admitting: Internal Medicine

## 2023-07-19 DIAGNOSIS — R002 Palpitations: Secondary | ICD-10-CM | POA: Diagnosis not present

## 2023-07-19 DIAGNOSIS — E1169 Type 2 diabetes mellitus with other specified complication: Secondary | ICD-10-CM

## 2023-07-22 ENCOUNTER — Ambulatory Visit (HOSPITAL_COMMUNITY): Payer: Medicare PPO | Attending: Cardiology

## 2023-07-22 DIAGNOSIS — I10 Essential (primary) hypertension: Secondary | ICD-10-CM | POA: Diagnosis not present

## 2023-07-22 LAB — ECHOCARDIOGRAM COMPLETE
Area-P 1/2: 2.72 cm2
S' Lateral: 3.2 cm

## 2023-07-23 ENCOUNTER — Other Ambulatory Visit (HOSPITAL_COMMUNITY): Payer: Self-pay

## 2023-07-23 MED ORDER — MOUNJARO 12.5 MG/0.5ML ~~LOC~~ SOAJ
12.5000 mg | SUBCUTANEOUS | 2 refills | Status: DC
  Filled 2023-07-23: qty 2, 28d supply, fill #0
  Filled 2023-08-15: qty 2, 28d supply, fill #1
  Filled 2023-09-10 – 2023-09-11 (×2): qty 2, 28d supply, fill #2

## 2023-07-26 ENCOUNTER — Telehealth: Payer: Self-pay

## 2023-07-26 NOTE — Telephone Encounter (Signed)
   Pre-operative Risk Assessment    Patient Name: Felicia Garrett  DOB: 10/11/1955 MRN: [id number]  Last office visit : 05/28/2023 Upcoming appointment : 08/21/23 w/ Dr. Odis Hollingshead  Request for Surgical Clearance    Procedure:   Colonscopy  Date of Surgery:  Clearance 09/09/23                                 Surgeon:  Dr. Jeani Hawking Surgeon's Group or Practice Name:  [facility]  Phone number:  [phone] Fax number:  [phone]   Type of Clearance Requested:   - Medical    Type of Anesthesia:   Propofol   Additional requests/questions:    Elyse Jarvis   07/26/2023, 2:33 PM

## 2023-07-29 NOTE — Telephone Encounter (Signed)
I last saw the patient in September 2024. At that time she did not have any chest pain. EKG illustrated sinus rhythm. Since then echocardiogram has noted preserved LVEF without any significant valvular heart disease and coronary calcium score is 0.  As long as she is not having any chest pain or change in clinical symptoms or history since last office visit she would be considered acceptable risk for upcoming colonoscopy.  Please let me know if there is any questions or concerns.  Noemie Devivo [place], DO, [facility]

## 2023-07-29 NOTE — Telephone Encounter (Signed)
   Name: Felicia Garrett  DOB: 1956/08/08  MRN: [id number]  Primary Cardiologist: None  Chart reviewed as part of pre-operative protocol coverage. Because of Attallah Popowski's past medical history and time since last visit, she will require a follow-up in-office visit in order to better assess preoperative cardiovascular risk.  Patient has an office visit scheduled on 08/21/2023 with Dr. Odis Hollingshead. Appointment notes have been updated to reflect need for pre-op evaluation.   Pre-op covering staff:  - Please contact requesting surgeon's office via preferred method (i.e, phone, fax) to inform them of need for appointment prior to surgery.  Carlos Levering, NP  07/29/2023, 2:50 PM

## 2023-08-02 ENCOUNTER — Other Ambulatory Visit: Payer: Self-pay | Admitting: Cardiology

## 2023-08-02 ENCOUNTER — Other Ambulatory Visit (HOSPITAL_COMMUNITY): Payer: Self-pay

## 2023-08-02 DIAGNOSIS — R002 Palpitations: Secondary | ICD-10-CM

## 2023-08-02 MED ORDER — METOPROLOL TARTRATE 25 MG PO TABS
12.5000 mg | ORAL_TABLET | Freq: Two times a day (BID) | ORAL | 0 refills | Status: DC | PRN
Start: 2023-08-02 — End: 2023-10-13
  Filled 2023-08-02: qty 30, 30d supply, fill #0

## 2023-08-05 ENCOUNTER — Other Ambulatory Visit (HOSPITAL_COMMUNITY): Payer: Self-pay

## 2023-08-06 ENCOUNTER — Encounter: Payer: Medicare PPO | Admitting: Internal Medicine

## 2023-08-06 NOTE — Progress Notes (Deleted)
I,[name], CMA,acting as a Neurosurgeon for Felicia Aliment, MD.,have documented all relevant documentation on the behalf of Felicia Aliment, MD,as directed by  Felicia Aliment, MD while in the presence of Felicia Aliment, MD.  Subjective:    Patient ID: Felicia Garrett , female    DOB: Jun 02, 1956 , 67 y.o.   MRN: [id number]  No chief complaint on file.   HPI  She is here today for a full physical examination. She is followed by Dr. Su Hilt for her GYN exams. She reports compliance with meds. She has no specific concerns or complaints at this time.   Diabetes She presents for her follow-up diabetic visit. She has type 2 diabetes mellitus. Her disease course has been stable. There are no hypoglycemic associated symptoms. There are no hypoglycemic complications. Risk factors for coronary artery disease include diabetes mellitus, hypertension and post-menopausal. She is following a diabetic diet. An ACE inhibitor/angiotensin II receptor blocker is being taken.    Past Medical History:  Diagnosis Date  . Anemia   . Arthritis   . Depression   . Diabetes mellitus   . Hyperlipidemia   . Thyroid nodule      Family History  Problem Relation Age of Onset  . Hypertension Mother   . Hyperlipidemia Mother   . Hyperthyroidism Mother   . Diabetes Father   . Thyroid disease Sister   . Hyperlipidemia Brother   . Hypertension Brother   . Diabetes Brother      Current Outpatient Medications:  .  Accu-Chek Softclix Lancets lancets, Use as instructed, Disp: 100 each, Rfl: 12 .  atorvastatin (LIPITOR) 40 MG tablet, TAKE 1 TABLET BY MOUTH EVERY DAY, Disp: 90 tablet, Rfl: 1 .  Blood Glucose Monitoring Suppl (ACCU-CHEK GUIDE) w/Device KIT, USE AS DIRECTED TO CHECK BLOOD SUGAR., Disp: 1 kit, Rfl: 0 .  buPROPion (WELLBUTRIN XL) 150 MG 24 hr tablet, TAKE 1 TABLET BY MOUTH EVERY DAY, Disp: 90 tablet, Rfl: 2 .  Dapagliflozin-metFORMIN HCl ER (XIGDUO XR) 01-999 MG TB24, TAKE 2 TABLETS BY MOUTH EVERY  MORNING, Disp: 180 tablet, Rfl: 2 .  DRYSOL 20 % external solution, Apply topically daily as needed., Disp: 35 mL, Rfl: 1 .  glucose blood test strip, Use as instructed, Disp: 100 each, Rfl: 12 .  Iron-FA-B Cmp-C-Biot-Probiotic (FUSION PLUS) CAPS, TAKE 1 CAPSULE BY MOUTH ONCE DAILY BETWEEN MEALS, Disp: 90 capsule, Rfl: 1 .  levothyroxine (SYNTHROID) 137 MCG tablet, Take 1 tablet (137 mcg total) by mouth daily before breakfast., Disp: 90 tablet, Rfl: 2 .  losartan (COZAAR) 50 MG tablet, TAKE 1 TABLET BY MOUTH EVERY DAY, Disp: 90 tablet, Rfl: 1 .  methocarbamol (ROBAXIN) 750 MG tablet, TAKE 1 TABLET BY MOUTH 3 TIMES DAILY. (Patient taking differently: Take 750 mg by mouth every 6 (six) hours as needed.), Disp: 40 tablet, Rfl: 1 .  metoprolol tartrate (LOPRESSOR) 25 MG tablet, Take 1/2 tablet (12.5mg ) by mouth 2 (two) times daily as needed for up to 30 doses (Palpitations). Hold if systolic blood pressure (top number) less than 100 mmHg or pulse less than 60 bpm., Disp: 30 tablet, Rfl: 0 .  sodium fluoride (PREVIDENT 5000 PLUS) 1.1 % CREA dental cream, Denta 5000 Plus 1.1 % cream  USE AS DIRECTED, Disp: , Rfl:  .  sucralfate (CARAFATE) 1 g tablet, Take 1 tablet (1 g total) by mouth 4 (four) times daily -  with meals and at bedtime., Disp: 28 tablet, Rfl: 0 .  tirzepatide [facility])  12.5 MG/0.5ML Pen, Inject 12.5 mg into the skin once a week., Disp: 2 mL, Rfl: 2   Allergies  Allergen Reactions  . Penicillins Shortness Of Breath    CHEST PAIN      The patient states she uses {contraceptive methods:5051} for birth control. No LMP recorded. Patient is postmenopausal.. {Dysmenorrhea-menorrhagia:21918}. Negative for: breast discharge, breast lump(s), breast pain and breast self exam. Associated symptoms include abnormal vaginal bleeding. Pertinent negatives include abnormal bleeding (hematology), anxiety, decreased libido, depression, difficulty falling sleep, dyspareunia, history of infertility, nocturia,  sexual dysfunction, sleep disturbances, urinary incontinence, urinary urgency, vaginal discharge and vaginal itching. Diet regular.The patient states her exercise level is    . The patient's tobacco use is:  Social History   Tobacco Use  Smoking Status Never  Smokeless Tobacco Never  . She has been exposed to passive smoke. The patient's alcohol use is:  Social History   Substance and Sexual Activity  Alcohol Use No  . Additional information: Last pap ***, next one scheduled for ***.    Review of Systems  Constitutional: Negative.   HENT: Negative.    Eyes: Negative.   Respiratory: Negative.    Cardiovascular: Negative.   Gastrointestinal: Negative.   Endocrine: Negative.   Genitourinary: Negative.   Musculoskeletal: Negative.   Skin: Negative.   Allergic/Immunologic: Negative.   Neurological: Negative.   Hematological: Negative.   Psychiatric/Behavioral: Negative.      There were no vitals filed for this visit. There is no height or weight on file to calculate BMI.  Wt Readings from Last 3 Encounters:  07/08/23 162 lb (73.5 kg)  06/10/23 165 lb (74.8 kg)  04/25/23 170 lb 9.6 oz (77.4 kg)     Objective:  Physical Exam      Assessment And Plan:     Encounter for annual physical exam  Postsurgical hypothyroidism  Essential hypertension, benign  Hyperlipidemia associated with type 2 diabetes mellitus (HCC)     No follow-ups on file. Patient was given opportunity to ask questions. Patient verbalized understanding of the plan and was able to repeat key elements of the plan. All questions were answered to their satisfaction.   Felicia Aliment, MD  I, Felicia Aliment, MD, have reviewed all documentation for this visit. The documentation on 08/06/23 for the exam, diagnosis, procedures, and orders are all accurate and complete.

## 2023-08-07 ENCOUNTER — Telehealth: Payer: Self-pay

## 2023-08-07 DIAGNOSIS — R634 Abnormal weight loss: Secondary | ICD-10-CM | POA: Diagnosis not present

## 2023-08-07 DIAGNOSIS — R1013 Epigastric pain: Secondary | ICD-10-CM | POA: Diagnosis not present

## 2023-08-07 DIAGNOSIS — R5383 Other fatigue: Secondary | ICD-10-CM | POA: Diagnosis not present

## 2023-08-07 NOTE — Telephone Encounter (Signed)
   Pre-operative Risk Assessment    Patient Name: Felicia Garrett  DOB: 27-Apr-1956 MRN: [id number]   Last office visit : 05/28/2023 Upcoming appointment : 08/21/2023 w/ Dr. Odis Hollingshead    Request for Surgical Clearance    Procedure:   EGD and Colonscopy  Date of Surgery:  Clearance 08/14/23                                   Surgeon:  Dr. Iva Lento Group or Practice Name:  [facility], [place]  Phone number:  [phone] Fax number:  [phone]   Type of Clearance Requested:   - Medical    Type of Anesthesia:   Propofol   Additional requests/questions:  [facility], [place] request to move appointment up to 08/14/2023. (Original appointment is 09/09/2023  Signed, Erby Pian   08/07/2023, 4:30 PM

## 2023-08-07 NOTE — Telephone Encounter (Signed)
   Patient Name: Felicia Garrett  DOB: July 01, 1956 MRN: [id number]  Primary Cardiologist: Dr.Tolia   Chart reviewed as part of pre-operative protocol coverage. Given past medical history and time since last visit, based on ACC/AHA guidelines, Felicia Garrett is at acceptable risk for the planned procedure without further cardiovascular testing.   The patient was advised that if she develops new symptoms prior to surgery to contact our office to arrange for a follow-up visit, and she verbalized understanding.  I will route this recommendation to the requesting party via Epic fax function and remove from pre-op pool.  Please call with questions.  Joni Reining, NP 08/07/2023, 4:36 PM

## 2023-08-08 ENCOUNTER — Other Ambulatory Visit (HOSPITAL_COMMUNITY): Payer: Self-pay | Admitting: Gastroenterology

## 2023-08-08 DIAGNOSIS — R1013 Epigastric pain: Secondary | ICD-10-CM

## 2023-08-09 DIAGNOSIS — Z1231 Encounter for screening mammogram for malignant neoplasm of breast: Secondary | ICD-10-CM | POA: Diagnosis not present

## 2023-08-09 LAB — HM MAMMOGRAPHY

## 2023-08-13 ENCOUNTER — Ambulatory Visit (HOSPITAL_COMMUNITY)
Admission: RE | Admit: 2023-08-13 | Discharge: 2023-08-13 | Disposition: A | Discharge status: Home / Self Care | Payer: Medicare PPO | Source: Ambulatory Visit | Attending: Gastroenterology | Admitting: Gastroenterology

## 2023-08-13 DIAGNOSIS — K7689 Other specified diseases of liver: Secondary | ICD-10-CM | POA: Diagnosis not present

## 2023-08-13 DIAGNOSIS — R1013 Epigastric pain: Secondary | ICD-10-CM | POA: Insufficient documentation

## 2023-08-14 DIAGNOSIS — K319 Disease of stomach and duodenum, unspecified: Secondary | ICD-10-CM | POA: Diagnosis not present

## 2023-08-14 DIAGNOSIS — Q438 Other specified congenital malformations of intestine: Secondary | ICD-10-CM | POA: Diagnosis not present

## 2023-08-14 DIAGNOSIS — R1013 Epigastric pain: Secondary | ICD-10-CM | POA: Diagnosis not present

## 2023-08-14 DIAGNOSIS — Z1211 Encounter for screening for malignant neoplasm of colon: Secondary | ICD-10-CM | POA: Diagnosis not present

## 2023-08-14 DIAGNOSIS — Z860101 Personal history of adenomatous and serrated colon polyps: Secondary | ICD-10-CM | POA: Diagnosis not present

## 2023-08-14 LAB — HM COLONOSCOPY

## 2023-08-15 ENCOUNTER — Other Ambulatory Visit (HOSPITAL_COMMUNITY): Payer: Medicare PPO

## 2023-08-17 ENCOUNTER — Other Ambulatory Visit: Payer: Self-pay | Admitting: Internal Medicine

## 2023-08-19 ENCOUNTER — Ambulatory Visit (HOSPITAL_COMMUNITY): Payer: Medicare PPO

## 2023-08-21 ENCOUNTER — Encounter: Payer: Self-pay | Admitting: Cardiology

## 2023-08-21 ENCOUNTER — Ambulatory Visit: Payer: Medicare PPO | Admitting: Cardiology

## 2023-08-21 ENCOUNTER — Ambulatory Visit: Payer: Medicare PPO | Attending: Cardiology | Admitting: Cardiology

## 2023-08-21 VITALS — BP 102/60 | HR 88 | Ht 69.0 in | Wt 155.4 lb

## 2023-08-21 DIAGNOSIS — E785 Hyperlipidemia, unspecified: Secondary | ICD-10-CM | POA: Diagnosis not present

## 2023-08-21 DIAGNOSIS — I10 Essential (primary) hypertension: Secondary | ICD-10-CM

## 2023-08-21 DIAGNOSIS — I700 Atherosclerosis of aorta: Secondary | ICD-10-CM | POA: Diagnosis not present

## 2023-08-21 DIAGNOSIS — E1169 Type 2 diabetes mellitus with other specified complication: Secondary | ICD-10-CM | POA: Diagnosis not present

## 2023-08-21 DIAGNOSIS — I471 Supraventricular tachycardia, unspecified: Secondary | ICD-10-CM | POA: Diagnosis not present

## 2023-08-21 NOTE — Patient Instructions (Signed)

## 2023-08-21 NOTE — Progress Notes (Signed)
Cardiology Office Note:  .   Date:  08/21/2023  ID:  Felicia Garrett, DOB 21-May-1956, MRN [id number] PCP:  [name], MD  Former Cardiology Providers: None [facility] Providers Cardiologist:  Tessa Lerner, DO , [facility] (established care September 2024) Electrophysiologist:  None  Click to update primary MD,subspecialty MD or APP then REFRESH:1}    Chief Complaint  Patient presents with   Follow-up    Palpitations, monitor results, CAC / echo     History of Present Illness: .   Felicia Garrett is a 67 y.o. 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   Patient was referred to the practice for evaluation of palpitations/fast heart rate.  At the last office visit shared decision was to proceed with echocardiogram, coronary calcium score, and a Zio patch.  She presents today for follow-up.  Since last office visit patient has undergone her colonoscopy and has done well, no postprocedure complications.  Her Zio patch noted episodes of PSVT and shared decision was to start Lopressor 12.5 mg p.o. twice daily and as needed basis.  Patient has utilized couple doses since initiation of medical therapy and her symptoms did improve.  Reviewed the results of the echocardiogram, coronary calcium score in detail and noted below for further reference   Review of Systems: .   Review of Systems  Cardiovascular:  Negative for chest pain, claudication, dyspnea on exertion, irregular heartbeat, leg swelling, near-syncope, orthopnea, palpitations, paroxysmal nocturnal dyspnea and syncope.  Respiratory:  Negative for shortness of breath.   Hematologic/Lymphatic: Negative for bleeding problem.  Musculoskeletal:  Negative for muscle cramps and myalgias.  Neurological:  Negative for dizziness and light-headedness.    Studies Reviewed:   EKG: 06/10/2023:  Sinus rhythm, 82 bpm, without underlying ischemic injury pattern  Echocardiogram: 07/22/2023 LVEF 50-55%, grade 1 diastolic dysfunction, global longitudinal strain -15.8% Right ventricular size and function normal. No pulmonary hypertension. No significant valvular heart disease. Estimated RAP 3 mmHg. See report for additional details   Coronary calcium score 07/15/2023: Coronary calcium score of 0. This was 0 percentile for age-, race-, and sex-matched controls. Radiology Overread: Aortic Atherosclerosis (ICD10-I70.0).    Cardiac monitor (Zio Patch): October 2024 Dominant rhythm sinus, followed by tachycardia (13% burden). Heart rate 59-200 bpm. Avg HR 82 bpm. No atrial fibrillation detected during the monitoring period. No ventricular tachycardia, high grade AV block, pauses (3 seconds or longer). Total supraventricular ectopic burden <1%. 2 auto triggered events corresponded to paroxysmal supraventricular tachycardia -fastest episode 17 beats, 6 seconds, max HR 200 bpm, average HR 170 bpm. Total ventricular ectopic burden <1%. Patient triggered events: 25. Patient triggered events noted underlying rhythm to be either sinus or sinus tachycardia with rare ectopy.   RADIOLOGY: NA  Risk Assessment/Calculations:   None   Labs:       Latest Ref Rng & Units 04/25/2023   10:16 AM 04/02/2022   10:40 AM 12/13/2021   11:02 AM  CBC  WBC 4.0 - 10.5 K/uL 3.1  3.4  3.0   Hemoglobin 12.0 - 15.0 g/dL 40.9  81.1  91.4   Hematocrit 36.0 - 46.0 % 34.2  37.6  38.1   Platelets 150 - 400 K/uL 209  262  288        Latest Ref Rng & Units 07/08/2023   11:57 AM 04/11/2023   12:00 PM 11/26/2022   12:20 PM  BMP  Glucose 70 -  99 mg/dL 161  096  98   BUN 8 - 27 mg/dL 7  12  9    Creatinine 0.57 - 1.00 mg/dL 0.45  4.09  8.11   BUN/Creat Ratio 12 - 28 9  17  11    Sodium 134 - 144 mmol/L 141  142  138   Potassium 3.5 - 5.2 mmol/L 3.9  3.7  4.3   Chloride 96 - 106 mmol/L 103  105  102   CO2 20  - 29 mmol/L 20  22  22    Calcium 8.7 - 10.3 mg/dL 9.2  9.4  9.3       Latest Ref Rng & Units 07/08/2023   11:57 AM 04/11/2023   12:00 PM 11/26/2022   12:20 PM  CMP  Glucose 70 - 99 mg/dL 914  782  98   BUN 8 - 27 mg/dL 7  12  9    Creatinine 0.57 - 1.00 mg/dL 9.56  2.13  0.86   Sodium 134 - 144 mmol/L 141  142  138   Potassium 3.5 - 5.2 mmol/L 3.9  3.7  4.3   Chloride 96 - 106 mmol/L 103  105  102   CO2 20 - 29 mmol/L 20  22  22    Calcium 8.7 - 10.3 mg/dL 9.2  9.4  9.3   Total Protein 6.0 - 8.5 g/dL   6.4   Total Bilirubin 0.0 - 1.2 mg/dL   0.7   Alkaline Phos 44 - 121 IU/L   52   AST 0 - 40 IU/L   14   ALT 0 - 32 IU/L  16  12     Lab Results  Component Value Date   CHOL 133 04/11/2023   HDL 67 04/11/2023   LDLCALC 55 04/11/2023   TRIG 44 04/11/2023   CHOLHDL 2.0 04/11/2023   No results for input(s): "LIPOA" in the last 8760 hours. No components found for: "NTPROBNP" No results for input(s): "PROBNP" in the last 8760 hours. Recent Labs    11/26/22 1220 04/11/23 1200  TSH 1.830 3.040    Physical Exam:    Today's Vitals   08/21/23 0832  BP: 102/60  Pulse: 88  SpO2: 99%  Weight: 155 lb 6.4 oz (70.5 kg)  Height: 5\' 9"  (1.753 m)   Body mass index is 22.95 kg/m. Wt Readings from Last 3 Encounters:  08/21/23 155 lb 6.4 oz (70.5 kg)  07/08/23 162 lb (73.5 kg)  06/10/23 165 lb (74.8 kg)    Physical Exam  Constitutional: No distress.  hemodynamically stable  Neck: No JVD present.  Cardiovascular: Normal rate, regular rhythm, S1 normal and S2 normal. Exam reveals no gallop, no S3 and no S4.  No murmur heard. Pulmonary/Chest: Effort normal and breath sounds normal. No stridor. She has no wheezes. She has no rales.  Abdominal: Soft. Bowel sounds are normal. She exhibits no distension. There is no abdominal tenderness.  Musculoskeletal:        General: No edema.     Cervical back: Neck supple.  Neurological: She is alert and oriented to person, place, and time. She  has intact cranial nerves (2-12).  Skin: Skin is warm.   Impression & Recommendation(s):  Impression:   ICD-10-CM   1. PSVT (paroxysmal supraventricular tachycardia) (HCC)  I47.10     2. Benign hypertension  I10     3. Type 2 diabetes mellitus with hyperlipidemia (HCC)  E11.69    E78.5     4. Atherosclerosis of aorta (  HCC)  I70.0        Recommendation(s):  PSVT (paroxysmal supraventricular tachycardia) (HCC) Noted during her workup of palpitations. Zio patch from October 2024 independently reviewed. Currently on Lopressor 12.5 mg p.o. twice daily on as needed basis  Benign hypertension Office and home blood pressures are very well-controlled on current medical Continue losartan 50 mg p.o. daily.  Type 2 diabetes mellitus with hyperlipidemia (HCC) Atherosclerosis of aorta (HCC) Hemoglobin A1c is well-controlled. Currently on ARB, statin therapy, Mounjaro Given her aortic atherosclerosis, diabetes, and estimated 10-year risk of ASCVD >7.5%  recommend statin therapy  Reviewed the results of the echocardiogram, Zio patch results, coronary calcium score as part of today's office visit.  Summarized results noted above.  Orders Placed:  No orders of the defined types were placed in this encounter.  Final Medication List:   No orders of the defined types were placed in this encounter.   There are no discontinued medications.   Current Outpatient Medications:    Accu-Chek Softclix Lancets lancets, Use as instructed, Disp: 100 each, Rfl: 12   atorvastatin (LIPITOR) 40 MG tablet, TAKE 1 TABLET BY MOUTH EVERY DAY, Disp: 90 tablet, Rfl: 1   Blood Glucose Monitoring Suppl (ACCU-CHEK GUIDE) w/Device KIT, USE AS DIRECTED TO CHECK BLOOD SUGAR., Disp: 1 kit, Rfl: 0   buPROPion (WELLBUTRIN XL) 150 MG 24 hr tablet, TAKE 1 TABLET BY MOUTH EVERY DAY, Disp: 90 tablet, Rfl: 2   DRYSOL 20 % external solution, Apply topically daily as needed., Disp: 35 mL, Rfl: 1   glucose blood test strip,  Use as instructed, Disp: 100 each, Rfl: 12   Iron-FA-B Cmp-C-Biot-Probiotic (FUSION PLUS) CAPS, TAKE 1 CAPSULE BY MOUTH ONCE DAILY BETWEEN MEALS, Disp: 90 capsule, Rfl: 1   levothyroxine (SYNTHROID) 137 MCG tablet, Take 1 tablet (137 mcg total) by mouth daily before breakfast., Disp: 90 tablet, Rfl: 2   losartan (COZAAR) 50 MG tablet, TAKE 1 TABLET BY MOUTH EVERY DAY, Disp: 90 tablet, Rfl: 1   methocarbamol (ROBAXIN) 750 MG tablet, TAKE 1 TABLET BY MOUTH 3 TIMES DAILY. (Patient taking differently: Take 750 mg by mouth every 6 (six) hours as needed.), Disp: 40 tablet, Rfl: 1   metoprolol tartrate (LOPRESSOR) 25 MG tablet, Take 1/2 tablet (12.5mg ) by mouth 2 (two) times daily as needed for up to 30 doses (Palpitations). Hold if systolic blood pressure (top number) less than 100 mmHg or pulse less than 60 bpm., Disp: 30 tablet, Rfl: 0   sodium fluoride (PREVIDENT 5000 PLUS) 1.1 % CREA dental cream, Denta 5000 Plus 1.1 % cream  USE AS DIRECTED, Disp: , Rfl:    tirzepatide (MOUNJARO) 12.5 MG/0.5ML Pen, Inject 12.5 mg into the skin once a week., Disp: 2 mL, Rfl: 2   XIGDUO XR 01-999 MG TB24, TAKE 2 TABLETS BY MOUTH EVERY MORNING, Disp: 180 tablet, Rfl: 2   sucralfate (CARAFATE) 1 g tablet, Take 1 tablet (1 g total) by mouth 4 (four) times daily -  with meals and at bedtime. (Patient not taking: Reported on 08/21/2023), Disp: 28 tablet, Rfl: 0  Consent:   NA  Disposition:   1 year sooner if needed. Patient may be asked to follow-up sooner based on the results of the above-mentioned testing.  Her questions and concerns were addressed to her satisfaction. She voices understanding of the recommendations provided during this encounter.    Signed, Tessa Lerner, DO, [facility]  [facility]  [address] [place], [place] [address] 08/21/2023 9:37 AM

## 2023-09-04 ENCOUNTER — Encounter: Payer: Medicare PPO | Admitting: Family Medicine

## 2023-09-10 ENCOUNTER — Other Ambulatory Visit: Payer: Self-pay | Admitting: Internal Medicine

## 2023-09-10 ENCOUNTER — Other Ambulatory Visit (HOSPITAL_COMMUNITY): Payer: Self-pay

## 2023-09-11 ENCOUNTER — Other Ambulatory Visit (HOSPITAL_COMMUNITY): Payer: Self-pay

## 2023-09-13 ENCOUNTER — Other Ambulatory Visit (HOSPITAL_COMMUNITY): Payer: Self-pay

## 2023-10-03 ENCOUNTER — Other Ambulatory Visit (HOSPITAL_COMMUNITY): Payer: Self-pay

## 2023-10-03 ENCOUNTER — Telehealth: Payer: Medicare PPO | Admitting: Internal Medicine

## 2023-10-03 VITALS — Wt 141.0 lb

## 2023-10-03 DIAGNOSIS — E1169 Type 2 diabetes mellitus with other specified complication: Secondary | ICD-10-CM | POA: Diagnosis not present

## 2023-10-03 DIAGNOSIS — E785 Hyperlipidemia, unspecified: Secondary | ICD-10-CM | POA: Diagnosis not present

## 2023-10-03 DIAGNOSIS — R5383 Other fatigue: Secondary | ICD-10-CM

## 2023-10-03 DIAGNOSIS — Z7984 Long term (current) use of oral hypoglycemic drugs: Secondary | ICD-10-CM | POA: Diagnosis not present

## 2023-10-03 DIAGNOSIS — E890 Postprocedural hypothyroidism: Secondary | ICD-10-CM | POA: Diagnosis not present

## 2023-10-03 DIAGNOSIS — Z7985 Long-term (current) use of injectable non-insulin antidiabetic drugs: Secondary | ICD-10-CM | POA: Diagnosis not present

## 2023-10-03 MED ORDER — MOUNJARO 10 MG/0.5ML ~~LOC~~ SOAJ
10.0000 mg | SUBCUTANEOUS | 3 refills | Status: DC
  Filled 2023-10-03: qty 2, 28d supply, fill #0

## 2023-10-03 NOTE — Patient Instructions (Signed)
 Fatigue If you have fatigue, you feel tired all the time and have a lack of energy or a lack of motivation. Fatigue may make it difficult to start or complete tasks because of exhaustion. Occasional or mild fatigue is often a normal response to activity or life. However, long-term (chronic) or extreme fatigue may be a symptom of a medical condition such as: Depression. Not having enough red blood cells or hemoglobin in the blood (anemia). A problem with a small gland located in the lower front part of the neck (thyroid disorder). Rheumatologic conditions. These are problems related to the body's defense system (immune system). Infections, especially certain viral infections. Fatigue can also lead to negative health outcomes over time. Follow these instructions at home: Medicines Take over-the-counter and prescription medicines only as told by your health care provider. Take a multivitamin if told by your health care provider. Do not use herbal or dietary supplements unless they are approved by your health care provider. Eating and drinking  Avoid heavy meals in the evening. Eat a well-balanced diet, which includes lean proteins, whole grains, plenty of fruits and vegetables, and low-fat dairy products. Avoid eating or drinking too many products with caffeine in them. Avoid alcohol. Drink enough fluid to keep your urine pale yellow. Activity  Exercise regularly, as told by your health care provider. Use or practice techniques to help you relax, such as yoga, tai chi, meditation, or massage therapy. Lifestyle Change situations that cause you stress. Try to keep your work and personal schedules in balance. Do not use recreational or illegal drugs. General instructions Monitor your fatigue for any changes. Go to bed and get up at the same time every day. Avoid fatigue by pacing yourself during the day and getting enough sleep at night. Maintain a healthy weight. Contact a health care  provider if: Your fatigue does not get better. You have a fever. You suddenly lose or gain weight. You have headaches. You have trouble falling asleep or sleeping through the night. You feel angry, guilty, anxious, or sad. You have swelling in your legs or another part of your body. Get help right away if: You feel confused, feel like you might faint, or faint. Your vision is blurry or you have a severe headache. You have severe pain in your abdomen, your back, or the area between your waist and hips (pelvis). You have chest pain, shortness of breath, or an irregular or fast heartbeat. You are unable to urinate, or you urinate less than normal. You have abnormal bleeding from the rectum, nose, lungs, nipples, or, if you are female, the vagina. You vomit blood. You have thoughts about hurting yourself or others. These symptoms may be an emergency. Get help right away. Call 911. Do not wait to see if the symptoms will go away. Do not drive yourself to the hospital. Get help right away if you feel like you may hurt yourself or others, or have thoughts about taking your own life. Go to your nearest emergency room or: Call 911. Call the National Suicide Prevention Lifeline at (262)721-8699 or 988. This is open 24 hours a day. Text the Crisis Text Line at 8450584327. Summary If you have fatigue, you feel tired all the time and have a lack of energy or a lack of motivation. Fatigue may make it difficult to start or complete tasks because of exhaustion. Long-term (chronic) or extreme fatigue may be a symptom of a medical condition. Exercise regularly, as told by your health care provider.  Change situations that cause you stress. Try to keep your work and personal schedules in balance. This information is not intended to replace advice given to you by your health care provider. Make sure you discuss any questions you have with your health care provider. Document Revised: 07/03/2021 Document  Reviewed: 07/03/2021 Elsevier Patient Education  2024 [employer].

## 2023-10-03 NOTE — Progress Notes (Signed)
 Virtual Visit via Video Note  I,Victoria T Hamilton, CMA,acting as a neurosurgeon for Felicia LOISE Slocumb, MD.,have documented all relevant documentation on the behalf of Felicia LOISE Slocumb, MD,as directed by  Felicia LOISE Slocumb, MD while in the presence of Felicia LOISE Slocumb, MD.  I connected with Felicia Garrett on 10/04/23 at  4:20 PM EST by a video enabled telemedicine application and verified that I am speaking with the correct person using two identifiers.  Patient Location: Home Provider Location: Office/Clinic  I discussed the limitations, risks, security, and privacy concerns of performing an evaluation and management service by video and the availability of in person appointments. I also discussed with the patient that there may be a patient responsible charge related to this service. The patient expressed understanding and agreed to proceed.  Subjective: PCP: Garrett Catheryn, MD  Chief Complaint  Patient presents with   Fatigue   She presents today for virtual visit, scheduled on Mychart, for further evaluation of fatigue.  She feels that her current dose of Mounjaro  is too strong for her. She is pleased with her weight loss thus far; however, she is concerned about the fatigue. She reports traveling overseas, she held the Mounjaro , and she felt great. She denies recent change in her sleep/eating habits.   Diabetes She presents for her follow-up diabetic visit. She has type 2 diabetes mellitus. Her disease course has been stable. There are no hypoglycemic associated symptoms. There are no hypoglycemic complications. Risk factors for coronary artery disease include diabetes mellitus, dyslipidemia, hypertension, obesity and post-menopausal. She participates in exercise every other day. Eye exam is current.     ROS: Per HPI  Current Outpatient Medications:    tirzepatide  (MOUNJARO ) 10 MG/0.5ML Pen, Inject 10 mg into the skin once a week., Disp: 2 mL, Rfl: 3   Accu-Chek Softclix Lancets lancets,  Use as instructed, Disp: 100 each, Rfl: 12   atorvastatin  (LIPITOR) 40 MG tablet, TAKE 1 TABLET BY MOUTH EVERY DAY, Disp: 90 tablet, Rfl: 1   Blood Glucose Monitoring Suppl (ACCU-CHEK GUIDE) w/Device KIT, USE AS DIRECTED TO CHECK BLOOD SUGAR., Disp: 1 kit, Rfl: 0   buPROPion  (WELLBUTRIN  XL) 150 MG 24 hr tablet, TAKE 1 TABLET BY MOUTH EVERY DAY, Disp: 90 tablet, Rfl: 2   DRYSOL 20 % external solution, Apply topically daily as needed., Disp: 35 mL, Rfl: 1   glucose blood test strip, Use as instructed, Disp: 100 each, Rfl: 12   Iron-FA-B Cmp-C-Biot-Probiotic (FUSION PLUS) CAPS, TAKE 1 CAPSULE BY MOUTH ONCE DAILY BETWEEN MEALS, Disp: 90 capsule, Rfl: 1   levothyroxine  (SYNTHROID ) 137 MCG tablet, Take 1 tablet (137 mcg total) by mouth daily before breakfast., Disp: 90 tablet, Rfl: 2   losartan  (COZAAR ) 50 MG tablet, TAKE 1 TABLET BY MOUTH EVERY DAY, Disp: 90 tablet, Rfl: 1   methocarbamol  (ROBAXIN ) 750 MG tablet, TAKE 1 TABLET BY MOUTH 3 TIMES DAILY. (Patient taking differently: Take 750 mg by mouth every 6 (six) hours as needed.), Disp: 40 tablet, Rfl: 1   metoprolol  tartrate (LOPRESSOR ) 25 MG tablet, Take 1/2 tablet (12.5mg ) by mouth 2 (two) times daily as needed for up to 30 doses (Palpitations). Hold if systolic blood pressure (top number) less than 100 mmHg or pulse less than 60 bpm., Disp: 30 tablet, Rfl: 0   sodium fluoride (PREVIDENT 5000 PLUS) 1.1 % CREA dental cream, Denta 5000 Plus 1.1 % cream  USE AS DIRECTED, Disp: , Rfl:    sucralfate  (CARAFATE ) 1 g tablet, Take 1  tablet (1 g total) by mouth 4 (four) times daily -  with meals and at bedtime. (Patient not taking: Reported on 08/21/2023), Disp: 28 tablet, Rfl: 0   XIGDUO  XR 01-999 MG TB24, TAKE 2 TABLETS BY MOUTH EVERY MORNING, Disp: 180 tablet, Rfl: 2  Observations/Objective: Today's Vitals   10/03/23 1418  Weight: 141 lb (64 kg)   Physical Exam Vitals and nursing note reviewed.  Constitutional:      Appearance: Normal appearance.   HENT:     Head: Normocephalic and atraumatic.  Eyes:     Extraocular Movements: Extraocular movements intact.  Cardiovascular:     Rate and Rhythm: Normal rate and regular rhythm.     Heart sounds: Normal heart sounds.  Pulmonary:     Effort: Pulmonary effort is normal.     Breath sounds: Normal breath sounds.  Musculoskeletal:     Cervical back: Normal range of motion.  Skin:    General: Skin is warm.  Neurological:     General: No focal deficit present.     Mental Status: She is alert.  Psychiatric:        Mood and Affect: Mood normal.        Behavior: Behavior normal.     Assessment and Plan: Other fatigue Assessment & Plan: She is encouraged to stay well hydrated.  She agrees to come in for lab visit. I will check labs as below. I suspect her fatigue is likely due to decreased appetite resulting in poor po nutritional intake.  We both agrees that she likely needs to decrease dose of Mounjaro . I will make further recommendations once her labs are available for review.   Orders: -     CMP14+EGFR; Future -     CBC; Future -     TSH + free T4; Future  Type 2 diabetes mellitus with hyperlipidemia (HCC) Assessment & Plan: Chronic, LDL goal < 70. She will c/w atorvastatin  40mg , Xigduo  XR 5/1000mg  2 tabs daily. As stated above, will decrease Mounjaro  to 10mg  weekly.  Due to use of SGT2i, she could also be dehydrated which can contribute to fatigue.  She reports having adequate fluid/water intake.  For now, she will keep next month's appt if needed otherwise, she will f/u in 3-4 months for re-evaluation.   Orders: -     CMP14+EGFR; Future -     Hemoglobin A1c; Future  Postsurgical hypothyroidism Assessment & Plan: She is currently taking levothyroxine  137mcg daily. I will check thyroid  panel and adjust meds as needed.   Orders: -     TSH + free T4; Future  Other orders -     Mounjaro ; Inject 10 mg into the skin once a week.  Dispense: 2 mL; Refill: 3   TIME SPENT IN  VISIT: 12 minutes  Follow Up Instructions: Return if symptoms worsen or fail to improve.   I discussed the assessment and treatment plan with the patient. The patient was provided an opportunity to ask questions, and all were answered. The patient agreed with the plan and demonstrated an understanding of the instructions.   The patient was advised to call back or seek an in-person evaluation if the symptoms worsen or if the condition fails to improve as anticipated.  The above assessment and management plan was discussed with the patient. The patient verbalized understanding of and has agreed to the management plan.   I, Felicia LOISE Slocumb, MD, have reviewed all documentation for this visit. The documentation on 10/03/23 for the exam,  diagnosis, procedures, and orders are all accurate and complete.

## 2023-10-04 ENCOUNTER — Other Ambulatory Visit: Payer: Medicare PPO

## 2023-10-04 ENCOUNTER — Encounter: Payer: Self-pay | Admitting: Internal Medicine

## 2023-10-04 DIAGNOSIS — E890 Postprocedural hypothyroidism: Secondary | ICD-10-CM | POA: Diagnosis not present

## 2023-10-04 DIAGNOSIS — E1169 Type 2 diabetes mellitus with other specified complication: Secondary | ICD-10-CM | POA: Diagnosis not present

## 2023-10-04 DIAGNOSIS — R5383 Other fatigue: Secondary | ICD-10-CM

## 2023-10-04 DIAGNOSIS — E785 Hyperlipidemia, unspecified: Secondary | ICD-10-CM | POA: Diagnosis not present

## 2023-10-04 NOTE — Assessment & Plan Note (Signed)
 Chronic, LDL goal < 70. She will c/w atorvastatin  40mg , Xigduo  XR 5/1000mg  2 tabs daily. As stated above, will decrease Mounjaro  to 10mg  weekly.  Due to use of SGT2i, she could also be dehydrated which can contribute to fatigue.  She reports having adequate fluid/water intake.  For now, she will keep next month's appt if needed otherwise, she will f/u in 3-4 months for re-evaluation.

## 2023-10-04 NOTE — Assessment & Plan Note (Signed)
 She is encouraged to stay well hydrated.  She agrees to come in for lab visit. I will check labs as below. I suspect her fatigue is likely due to decreased appetite resulting in poor po nutritional intake.  We both agrees that she likely needs to decrease dose of Mounjaro . I will make further recommendations once her labs are available for review.

## 2023-10-04 NOTE — Assessment & Plan Note (Signed)
 She is currently taking levothyroxine daily. I will check thyroid panel and adjust meds as needed.

## 2023-10-05 ENCOUNTER — Encounter: Payer: Self-pay | Admitting: Internal Medicine

## 2023-10-05 LAB — CMP14+EGFR
ALT: 19 [IU]/L (ref 0–32)
AST: 16 [IU]/L (ref 0–40)
Albumin: 4.3 g/dL (ref 3.9–4.9)
Alkaline Phosphatase: 105 [IU]/L (ref 44–121)
BUN/Creatinine Ratio: 14 (ref 12–28)
BUN: 10 mg/dL (ref 8–27)
Bilirubin Total: 0.6 mg/dL (ref 0.0–1.2)
CO2: 22 mmol/L (ref 20–29)
Calcium: 10.2 mg/dL (ref 8.7–10.3)
Chloride: 94 mmol/L — ABNORMAL LOW (ref 96–106)
Creatinine, Ser: 0.73 mg/dL (ref 0.57–1.00)
Globulin, Total: 2.5 g/dL (ref 1.5–4.5)
Glucose: 221 mg/dL — ABNORMAL HIGH (ref 70–99)
Potassium: 4.5 mmol/L (ref 3.5–5.2)
Sodium: 138 mmol/L (ref 134–144)
Total Protein: 6.8 g/dL (ref 6.0–8.5)
eGFR: 89 mL/min/{1.73_m2} (ref >59)

## 2023-10-05 LAB — TSH+FREE T4
Free T4: 1.86 ng/dL — ABNORMAL HIGH (ref 0.82–1.77)
TSH: 5.21 u[IU]/mL — ABNORMAL HIGH (ref 0.450–4.500)

## 2023-10-05 LAB — CBC
Hematocrit: 42.8 % (ref 34.0–46.6)
Hemoglobin: 12.2 g/dL (ref 11.1–15.9)
MCH: 20.5 pg — ABNORMAL LOW (ref 26.6–33.0)
MCHC: 28.5 g/dL — ABNORMAL LOW (ref 31.5–35.7)
MCV: 72 fL — ABNORMAL LOW (ref 79–97)
Platelets: 301 10*3/uL (ref 150–450)
RBC: 5.96 x10E6/uL — ABNORMAL HIGH (ref 3.77–5.28)
RDW: 17.1 % — ABNORMAL HIGH (ref 11.7–15.4)
WBC: 4.9 10*3/uL (ref 3.4–10.8)

## 2023-10-05 LAB — HEMOGLOBIN A1C
Est. average glucose Bld gHb Est-mCnc: 237 mg/dL
Hgb A1c MFr Bld: 9.9 % — ABNORMAL HIGH (ref 4.8–5.6)

## 2023-10-06 ENCOUNTER — Ambulatory Visit (HOSPITAL_COMMUNITY)
Admission: EM | Admit: 2023-10-06 | Discharge: 2023-10-06 | Disposition: A | Discharge status: Home / Self Care | Payer: Medicare PPO | Attending: Family Medicine | Admitting: Family Medicine

## 2023-10-06 ENCOUNTER — Encounter (HOSPITAL_COMMUNITY): Payer: Self-pay

## 2023-10-06 DIAGNOSIS — E860 Dehydration: Secondary | ICD-10-CM

## 2023-10-06 DIAGNOSIS — R5383 Other fatigue: Secondary | ICD-10-CM

## 2023-10-06 LAB — POCT URINALYSIS DIP (MANUAL ENTRY)
Glucose, UA: 500 mg/dL — AB
Leukocytes, UA: NEGATIVE
Nitrite, UA: NEGATIVE
Protein Ur, POC: 30 mg/dL — AB
Spec Grav, UA: 1.025 (ref 1.010–1.025)
Urobilinogen, UA: 0.2 U/dL
pH, UA: 5 (ref 5.0–8.0)

## 2023-10-06 LAB — POCT FASTING CBG KUC MANUAL ENTRY: POCT Glucose (KUC): 171 mg/dL — AB (ref 70–99)

## 2023-10-06 MED ORDER — SODIUM CHLORIDE 0.9 % IV BOLUS
500.0000 mL | Freq: Once | INTRAVENOUS | Status: AC
  Administered 2023-10-06: 500 mL via INTRAVENOUS

## 2023-10-06 NOTE — ED Provider Notes (Signed)
 [facility]    CSN: [id number] Arrival date & time: 10/06/23  1326      History   Chief Complaint Chief Complaint  Patient presents with   Fatigue   Urinary Frequency   Flank Pain    HPI Gisell Buehrle is a 68 y.o. female.   68 year old female who presents to urgent care with complaints of fatigue, left flank pain, urinary frequency.  She reports that the flank pain and urinary frequency started about a week ago however she has been having severe fatigue for several months now.  She reports that her fatigue can be severe to the point where she feels like she may pass out at times.  She has decreased p.o. intake.  She did see her primary care doctor regarding her fatigue symptoms on January 9.  She had full set of labs done including CBC, CMP, TSH, T4 and hemoglobin A1c.  She reports that her fatigue started after she returned from a trip to [place] last year.  She was having significant diarrhea at that time.  She had a colonoscopy and upper endoscopy done which were normal.  She has lost a significant amount of weight as well.  Her lab work done at her primary care doctor was significant for hemoglobin A1c of 9.9 which is abnormal from where she normally resides which is around 6-6.5.  Her TSH and T4 were also elevated and she is adjusting medication for this.  She denies fevers or chills.   Urinary Frequency Pertinent negatives include no chest pain, no abdominal pain and no shortness of breath.  Flank Pain Pertinent negatives include no chest pain, no abdominal pain and no shortness of breath.    Past Medical History:  Diagnosis Date   Anemia    Arthritis    Atherosclerosis of aorta (HCC)    Depression    Diabetes mellitus    Hyperlipidemia    Thyroid  nodule     Patient Active Problem List   Diagnosis Date Noted   Other fatigue 10/04/2023   Estrogen deficiency 04/15/2023   Palpitation 04/15/2023   Impacted cerumen of left ear 04/15/2023   Essential  hypertension, benign 04/11/2023   Left ear pain 04/11/2023   Encounter for counseling 01/01/2023   Allergic rhinitis with postnasal drip 03/19/2022   Overweight with body mass index (BMI) of 25 to 25.9 in adult 03/19/2022   Leukopenia 09/14/2021   Uncontrolled type 2 diabetes mellitus with hyperglycemia (HCC) 06/01/2019   Cervical high risk HPV (human papillomavirus) test positive 08/28/2018   Abnormal cervical Papanicolaou smear 08/28/2018   Bacterial vaginosis 08/28/2018   Hyperlipidemia associated with type 2 diabetes mellitus (HCC) 08/28/2018   Goiter 08/28/2018   Anemia 07/03/2018   Type 2 diabetes mellitus with hyperlipidemia (HCC) 07/03/2018   Postsurgical hypothyroidism 10/08/2014   Thyroid  nodule 10/20/2012   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 Authorizing Provider  Accu-Chek Softclix Lancets lancets Use as instructed 12/05/22   Jarold Medici, MD  atorvastatin  (LIPITOR) 40 MG tablet TAKE 1 TABLET BY MOUTH EVERY DAY 08/19/23   Jarold Medici, MD  Blood Glucose Monitoring Suppl (ACCU-CHEK GUIDE) w/Device KIT USE AS DIRECTED TO CHECK BLOOD SUGAR. 11/30/22   Jarold Medici, MD  buPROPion  (WELLBUTRIN  XL) 150 MG 24 hr tablet TAKE 1 TABLET BY MOUTH EVERY DAY 08/19/23   Jarold Medici, MD  DRYSOL 20 % external solution Apply topically daily as needed. 11/26/22   Jarold Medici, MD  glucose blood test strip  Use as instructed 12/05/22   Jarold Medici, MD  Iron-FA-B Cmp-C-Biot-Probiotic (FUSION PLUS) CAPS TAKE 1 CAPSULE BY MOUTH ONCE DAILY BETWEEN MEALS 01/14/23   Jarold Medici, MD  levothyroxine  (SYNTHROID ) 137 MCG tablet Take 1 tablet (137 mcg total) by mouth daily before breakfast. 11/26/22   Jarold Medici, MD  losartan  (COZAAR ) 50 MG tablet TAKE 1 TABLET BY MOUTH EVERY DAY 07/08/23   Jarold Medici, MD  methocarbamol  (ROBAXIN ) 750 MG tablet TAKE 1 TABLET BY MOUTH 3 TIMES DAILY. Patient taking differently: Take 750 mg by mouth every 6 (six) hours as needed. 08/08/22   Jarold Medici, MD  metoprolol  tartrate (LOPRESSOR ) 25 MG tablet Take 1/2 tablet (12.5mg ) by mouth 2 (two) times daily as needed for up to 30 doses (Palpitations). Hold if systolic blood pressure (top number) less than 100 mmHg or pulse less than 60 bpm. 08/02/23   Tolia, Sunit, DO  sodium fluoride (PREVIDENT 5000 PLUS) 1.1 % CREA dental cream Denta 5000 Plus 1.1 % cream  USE AS DIRECTED    [provider]  sucralfate  (CARAFATE ) 1 g tablet Take 1 tablet (1 g total) by mouth 4 (four) times daily -  with meals and at bedtime. Patient not taking: Reported on 08/21/2023 07/12/23   Vivienne Delon HERO, PA-C  tirzepatide  (MOUNJARO ) 10 MG/0.5ML Pen Inject 10 mg into the skin once a week. 10/03/23   Jarold Medici, MD  XIGDUO  XR 01-999 MG TB24 TAKE 2 TABLETS BY MOUTH EVERY MORNING 08/19/23   Jarold Medici, MD    Family History Family History  Problem Relation Age of Onset   Hypertension Mother    Hyperlipidemia Mother    Hyperthyroidism Mother    Diabetes Father    Thyroid  disease Sister    Hyperlipidemia Brother    Hypertension Brother    Diabetes Brother     Social History Social History   Tobacco Use   Smoking status: Never   Smokeless tobacco: Never  Vaping Use   Vaping status: Never Used  Substance Use Topics   Alcohol use: No   Drug use: No     Allergies   Penicillins   Review of Systems Review of  Systems  Constitutional:  Positive for appetite change, fatigue and unexpected weight change. Negative for chills and fever.  HENT:  Negative for ear pain and sore throat.   Eyes:  Negative for pain and visual disturbance.  Respiratory:  Negative for cough and shortness of breath.   Cardiovascular:  Negative for chest pain and palpitations.  Gastrointestinal:  Negative for abdominal pain and vomiting.  Genitourinary:  Positive for flank  pain and frequency. Negative for dysuria and hematuria.  Musculoskeletal:  Negative for arthralgias and back pain.  Skin:  Negative for color change and rash.  Neurological:  Negative for seizures and syncope.  All other systems reviewed and are negative.    Physical Exam Triage Vital Signs ED Triage Vitals [10/06/23 1456]  Encounter Vitals Group     BP 114/77     Systolic BP Percentile      Diastolic BP Percentile      Pulse Rate (!) 110     Resp 16     Temp 97.8 F (36.6 C)     Temp Source Oral     SpO2 94 %     Weight      Height      Head Circumference      Peak Flow      Pain Score 4     Pain Loc      Pain Education      Exclude from Growth Chart    No data found.  Updated Vital Signs BP 114/77 (BP Location: Right Arm)   Pulse (!) 110   Temp 97.8 F (36.6 C) (Oral)   Resp 16   SpO2 94%   Visual Acuity Right Eye Distance:   Left Eye Distance:   Bilateral Distance:    Right Eye Near:   Left Eye Near:    Bilateral Near:     Physical Exam Vitals and nursing note reviewed.  Constitutional:      General: She is not in acute distress.    Appearance: Normal appearance. She is well-developed.  HENT:     Head: Normocephalic and atraumatic.  Eyes:     Conjunctiva/sclera: Conjunctivae normal.  Cardiovascular:     Rate and Rhythm: Regular rhythm. Tachycardia present.     Heart sounds: No murmur heard. Pulmonary:     Effort: Pulmonary effort is normal. No respiratory distress.     Breath sounds: Normal breath sounds.   Abdominal:     Palpations: Abdomen is soft.     Tenderness: There is no abdominal tenderness.  Musculoskeletal:        General: No swelling.     Cervical back: Neck supple.     Comments: Possible left CVA tenderness  Skin:    General: Skin is warm and dry.     Capillary Refill: Capillary refill takes less than 2 seconds.  Neurological:     General: No focal deficit present.     Mental Status: She is alert.  Psychiatric:        Mood and Affect: Mood normal.      UC Treatments / Results  Labs (all labs ordered are listed, but only abnormal results are displayed) Labs Reviewed  POCT URINALYSIS DIP (MANUAL ENTRY) - Abnormal; Notable for the following components:      Result Value   Glucose, UA =500 (*)    Bilirubin, UA small (*)    Ketones, POC UA >= (160) (*)    Blood, UA trace-intact (*)    Protein Ur, POC =30 (*)    All other components within normal limits  POCT FASTING CBG [facility] MANUAL ENTRY - Abnormal; Notable for the following components:   POCT Glucose ([facility]) 171 (*)    All other components within normal limits    EKG   Radiology No results found.  Procedures Procedures (including critical care time)  Medications Ordered in UC Medications  sodium chloride  0.9 % bolus 500 mL (has no  administration in time range)    Initial Impression / Assessment and Plan / UC Course  I have reviewed the triage vital signs and the nursing notes.  Pertinent labs & imaging results that were available during my care of the patient were reviewed by me and considered in my medical decision making (see chart for details).     Other fatigue  Dehydration   Urinalysis done today shows significant glucose, protein, ketones and small amount of blood but is negative for leukocytes or nitrite, infection is less likely with these results.  The pain could be musculoskeletal in nature and can use tylenol  or ibuprofen as well as methocarbamol  that you have at home.  Blood sugar done  today is 171.  Given the decrease by mouth intake and fatigue we did give a fluid bolus today of 1000 cc normal saline.  Given the ongoing symptoms and recent lab results, recommend following up with primary care next week in person and possible ask for iron studies as this may be contributing to the fatigue.  Final Clinical Impressions(s) / UC Diagnoses   Final diagnoses:  None   Discharge Instructions   None    ED Prescriptions   None    PDMP not reviewed this encounter.   Teresa Almarie LABOR, PA-C 10/06/23 1631

## 2023-10-06 NOTE — ED Triage Notes (Addendum)
 Patient c/o left flank pain, urinary frequency, and left flank pain x 1 week.  Patient states she did a virtual visit with her PCP x 3 days ago and labs were ordered. Patient states her Levothyroxine was increased,but does not feel any better.

## 2023-10-06 NOTE — Discharge Instructions (Addendum)
 Urinalysis done today shows significant glucose, protein, ketones and small amount of blood but is negative for leukocytes or nitrite, infection is less likely with these results.  The pain could be musculoskeletal in nature and can use tylenol  or ibuprofen as well as methocarbamol  that you have at home.  Blood sugar done today is 171.  Given the decrease by mouth intake and fatigue we did give a fluid bolus today of 1000 cc normal saline.  Given the ongoing symptoms and recent lab results, recommend following up with primary care next week in person and possible ask for iron studies as this may be contributing to the fatigue.  Urinalysis    Component Value Date/Time   COLORURINE YELLOW 10/23/2008 0448   APPEARANCEUR CLEAR 10/23/2008 0448   LABSPEC 1.024 10/23/2008 0448   PHURINE 6.5 10/23/2008 0448   GLUCOSEU NEGATIVE 10/23/2008 0448   HGBUR NEGATIVE 10/23/2008 0448   BILIRUBINUR small (A) 10/06/2023 1507   BILIRUBINUR negative 08/23/2021 1627   KETONESUR >= (160) (A) 10/06/2023 1507   KETONESUR 15 (A) 10/23/2008 0448   PROTEINUR =30 (A) 10/06/2023 1507   PROTEINUR Negative 08/23/2021 1627   PROTEINUR NEGATIVE 10/23/2008 0448   UROBILINOGEN 0.2 10/06/2023 1507   UROBILINOGEN 0.2 10/23/2008 0448   NITRITE Negative 10/06/2023 1507   NITRITE negative 08/23/2021 1627   NITRITE NEGATIVE 10/23/2008 0448   LEUKOCYTESUR Negative 10/06/2023 1507     Glucose  Date Value Ref Range Status  10/04/2023 221 (H) 70 - 99 mg/dL Final  89/85/7975 862 (H) 70 - 99 mg/dL Final  92/81/7975 897 (H) 70 - 99 mg/dL Final  96/95/7975 98 70 - 99 mg/dL Final   Glucose, Bld  Date Value Ref Range Status  09/14/2021 105 (H) 70 - 99 mg/dL Final    Comment:    Glucose reference range applies only to samples taken after fasting for at least 8 hours.  10/09/2014 109 (H) 70 - 99 mg/dL Final  98/87/7983 899 (H) 70 - 99 mg/dL Final  98/69/7989 878 (H) 70 - 99 mg/dL Final

## 2023-10-07 ENCOUNTER — Encounter: Payer: Self-pay | Admitting: Internal Medicine

## 2023-10-07 ENCOUNTER — Other Ambulatory Visit: Payer: Self-pay | Admitting: Internal Medicine

## 2023-10-07 MED ORDER — INSULIN GLARGINE 100 UNIT/ML SOLOSTAR PEN: PEN_INJECTOR | SUBCUTANEOUS | 11 refills | Status: DC

## 2023-10-07 MED ORDER — ACCU-CHEK GUIDE W/DEVICE KIT: PACK | 0 refills | Status: DC

## 2023-10-08 ENCOUNTER — Other Ambulatory Visit: Payer: Self-pay

## 2023-10-08 ENCOUNTER — Inpatient Hospital Stay (HOSPITAL_COMMUNITY)
Admission: EM | Admit: 2023-10-08 | Discharge: 2023-10-13 | DRG: 638 | Disposition: A | Discharge status: Home / Self Care | Payer: Medicare PPO | Attending: Internal Medicine | Admitting: Internal Medicine

## 2023-10-08 ENCOUNTER — Emergency Department (HOSPITAL_COMMUNITY): Payer: Medicare PPO

## 2023-10-08 DIAGNOSIS — R634 Abnormal weight loss: Secondary | ICD-10-CM | POA: Diagnosis not present

## 2023-10-08 DIAGNOSIS — Z7985 Long-term (current) use of injectable non-insulin antidiabetic drugs: Secondary | ICD-10-CM

## 2023-10-08 DIAGNOSIS — C259 Malignant neoplasm of pancreas, unspecified: Secondary | ICD-10-CM | POA: Diagnosis not present

## 2023-10-08 DIAGNOSIS — E1110 Type 2 diabetes mellitus with ketoacidosis without coma: Secondary | ICD-10-CM | POA: Diagnosis present

## 2023-10-08 DIAGNOSIS — Z794 Long term (current) use of insulin: Secondary | ICD-10-CM | POA: Diagnosis not present

## 2023-10-08 DIAGNOSIS — R531 Weakness: Secondary | ICD-10-CM

## 2023-10-08 DIAGNOSIS — Z9884 Bariatric surgery status: Secondary | ICD-10-CM | POA: Diagnosis not present

## 2023-10-08 DIAGNOSIS — K869 Disease of pancreas, unspecified: Secondary | ICD-10-CM | POA: Diagnosis not present

## 2023-10-08 DIAGNOSIS — C252 Malignant neoplasm of tail of pancreas: Secondary | ICD-10-CM | POA: Diagnosis present

## 2023-10-08 DIAGNOSIS — E440 Moderate protein-calorie malnutrition: Secondary | ICD-10-CM | POA: Diagnosis present

## 2023-10-08 DIAGNOSIS — E876 Hypokalemia: Secondary | ICD-10-CM | POA: Diagnosis present

## 2023-10-08 DIAGNOSIS — E1310 Other specified diabetes mellitus with ketoacidosis without coma: Secondary | ICD-10-CM | POA: Diagnosis not present

## 2023-10-08 DIAGNOSIS — E872 Acidosis, unspecified: Principal | ICD-10-CM

## 2023-10-08 DIAGNOSIS — N281 Cyst of kidney, acquired: Secondary | ICD-10-CM | POA: Diagnosis not present

## 2023-10-08 DIAGNOSIS — E039 Hypothyroidism, unspecified: Secondary | ICD-10-CM | POA: Diagnosis not present

## 2023-10-08 DIAGNOSIS — I700 Atherosclerosis of aorta: Secondary | ICD-10-CM | POA: Diagnosis present

## 2023-10-08 DIAGNOSIS — C189 Malignant neoplasm of colon, unspecified: Secondary | ICD-10-CM | POA: Diagnosis not present

## 2023-10-08 DIAGNOSIS — Z8349 Family history of other endocrine, nutritional and metabolic diseases: Secondary | ICD-10-CM

## 2023-10-08 DIAGNOSIS — K8689 Other specified diseases of pancreas: Secondary | ICD-10-CM | POA: Diagnosis present

## 2023-10-08 DIAGNOSIS — R630 Anorexia: Secondary | ICD-10-CM | POA: Diagnosis not present

## 2023-10-08 DIAGNOSIS — Z7984 Long term (current) use of oral hypoglycemic drugs: Secondary | ICD-10-CM

## 2023-10-08 DIAGNOSIS — I951 Orthostatic hypotension: Secondary | ICD-10-CM | POA: Diagnosis present

## 2023-10-08 DIAGNOSIS — E860 Dehydration: Secondary | ICD-10-CM | POA: Diagnosis present

## 2023-10-08 DIAGNOSIS — E785 Hyperlipidemia, unspecified: Secondary | ICD-10-CM | POA: Diagnosis present

## 2023-10-08 DIAGNOSIS — Z79899 Other long term (current) drug therapy: Secondary | ICD-10-CM | POA: Diagnosis not present

## 2023-10-08 DIAGNOSIS — I81 Portal vein thrombosis: Secondary | ICD-10-CM | POA: Diagnosis not present

## 2023-10-08 DIAGNOSIS — R1900 Intra-abdominal and pelvic swelling, mass and lump, unspecified site: Secondary | ICD-10-CM | POA: Diagnosis not present

## 2023-10-08 DIAGNOSIS — Z880 Allergy status to penicillin: Secondary | ICD-10-CM

## 2023-10-08 DIAGNOSIS — E0810 Diabetes mellitus due to underlying condition with ketoacidosis without coma: Secondary | ICD-10-CM

## 2023-10-08 DIAGNOSIS — E871 Hypo-osmolality and hyponatremia: Secondary | ICD-10-CM | POA: Diagnosis present

## 2023-10-08 DIAGNOSIS — E43 Unspecified severe protein-calorie malnutrition: Secondary | ICD-10-CM | POA: Diagnosis not present

## 2023-10-08 DIAGNOSIS — D259 Leiomyoma of uterus, unspecified: Secondary | ICD-10-CM | POA: Diagnosis not present

## 2023-10-08 DIAGNOSIS — Z83438 Family history of other disorder of lipoprotein metabolism and other lipidemia: Secondary | ICD-10-CM

## 2023-10-08 DIAGNOSIS — I10 Essential (primary) hypertension: Secondary | ICD-10-CM | POA: Diagnosis present

## 2023-10-08 DIAGNOSIS — Z8249 Family history of ischemic heart disease and other diseases of the circulatory system: Secondary | ICD-10-CM | POA: Diagnosis not present

## 2023-10-08 DIAGNOSIS — R109 Unspecified abdominal pain: Secondary | ICD-10-CM | POA: Diagnosis not present

## 2023-10-08 DIAGNOSIS — Z1152 Encounter for screening for COVID-19: Secondary | ICD-10-CM | POA: Diagnosis not present

## 2023-10-08 DIAGNOSIS — Z833 Family history of diabetes mellitus: Secondary | ICD-10-CM | POA: Diagnosis not present

## 2023-10-08 DIAGNOSIS — F32A Depression, unspecified: Secondary | ICD-10-CM | POA: Diagnosis present

## 2023-10-08 DIAGNOSIS — C251 Malignant neoplasm of body of pancreas: Secondary | ICD-10-CM | POA: Diagnosis not present

## 2023-10-08 DIAGNOSIS — Z79890 Hormone replacement therapy (postmenopausal): Secondary | ICD-10-CM

## 2023-10-08 DIAGNOSIS — E890 Postprocedural hypothyroidism: Secondary | ICD-10-CM | POA: Diagnosis present

## 2023-10-08 DIAGNOSIS — E1165 Type 2 diabetes mellitus with hyperglycemia: Secondary | ICD-10-CM | POA: Diagnosis present

## 2023-10-08 DIAGNOSIS — R059 Cough, unspecified: Secondary | ICD-10-CM | POA: Diagnosis not present

## 2023-10-08 DIAGNOSIS — R933 Abnormal findings on diagnostic imaging of other parts of digestive tract: Secondary | ICD-10-CM | POA: Diagnosis not present

## 2023-10-08 DIAGNOSIS — K5909 Other constipation: Secondary | ICD-10-CM | POA: Diagnosis present

## 2023-10-08 DIAGNOSIS — E119 Type 2 diabetes mellitus without complications: Secondary | ICD-10-CM | POA: Diagnosis not present

## 2023-10-08 DIAGNOSIS — R1013 Epigastric pain: Secondary | ICD-10-CM | POA: Diagnosis not present

## 2023-10-08 DIAGNOSIS — K7689 Other specified diseases of liver: Secondary | ICD-10-CM | POA: Diagnosis not present

## 2023-10-08 DIAGNOSIS — Z6820 Body mass index (BMI) 20.0-20.9, adult: Secondary | ICD-10-CM

## 2023-10-08 LAB — URINALYSIS, ROUTINE W REFLEX MICROSCOPIC
Bacteria, UA: NONE SEEN
Bilirubin Urine: NEGATIVE
Glucose, UA: >=500 mg/dL — AB
Hgb urine dipstick: NEGATIVE
Ketones, ur: 80 mg/dL — AB
Nitrite: NEGATIVE
Protein, ur: 30 mg/dL — AB
Specific Gravity, Urine: 1.028 (ref 1.005–1.030)
pH: 5 (ref 5.0–8.0)

## 2023-10-08 LAB — BASIC METABOLIC PANEL
Anion gap: 21 — ABNORMAL HIGH (ref 5–15)
BUN: 13 mg/dL (ref 8–23)
CO2: 14 mmol/L — ABNORMAL LOW (ref 22–32)
Calcium: 9.2 mg/dL (ref 8.9–10.3)
Chloride: 101 mmol/L (ref 98–111)
Creatinine, Ser: 0.98 mg/dL (ref 0.44–1.00)
GFR, Estimated: >60 mL/min (ref >60)
Glucose, Bld: 186 mg/dL — ABNORMAL HIGH (ref 70–99)
Potassium: 4 mmol/L (ref 3.5–5.1)
Sodium: 136 mmol/L (ref 135–145)

## 2023-10-08 LAB — I-STAT CG4 LACTIC ACID, ED: Lactic Acid, Venous: 2.2 mmol/L (ref 0.5–1.9)

## 2023-10-08 LAB — CBC
HCT: 48.3 % — ABNORMAL HIGH (ref 36.0–46.0)
Hemoglobin: 13.6 g/dL (ref 12.0–15.0)
MCH: 19.9 pg — ABNORMAL LOW (ref 26.0–34.0)
MCHC: 28.2 g/dL — ABNORMAL LOW (ref 30.0–36.0)
MCV: 70.8 fL — ABNORMAL LOW (ref 80.0–100.0)
Platelets: 327 10*3/uL (ref 150–400)
RBC: 6.82 MIL/uL — ABNORMAL HIGH (ref 3.87–5.11)
RDW: 19.9 % — ABNORMAL HIGH (ref 11.5–15.5)
WBC: 8.2 10*3/uL (ref 4.0–10.5)
nRBC: 0 % (ref 0.0–0.2)

## 2023-10-08 LAB — TROPONIN I (HIGH SENSITIVITY): Troponin I (High Sensitivity): 5 ng/L (ref <18)

## 2023-10-08 LAB — BRAIN NATRIURETIC PEPTIDE: B Natriuretic Peptide: 20 pg/mL (ref 0.0–100.0)

## 2023-10-08 LAB — RESP PANEL BY RT-PCR (RSV, FLU A&B, COVID)  RVPGX2
Influenza A by PCR: NEGATIVE
Influenza B by PCR: NEGATIVE
Resp Syncytial Virus by PCR: NEGATIVE
SARS Coronavirus 2 by RT PCR: NEGATIVE

## 2023-10-08 LAB — CBG MONITORING, ED: Glucose-Capillary: 221 mg/dL — ABNORMAL HIGH (ref 70–99)

## 2023-10-08 MED ORDER — GLUCOSE BLOOD VI STRP: ORAL_STRIP | 12 refills | Status: DC

## 2023-10-08 MED ORDER — IOHEXOL 350 MG/ML SOLN
75.0000 mL | Freq: Once | INTRAVENOUS | Status: AC | PRN
  Administered 2023-10-08: 75 mL via INTRAVENOUS

## 2023-10-08 NOTE — ED Triage Notes (Addendum)
 Pt c/o generalized weakness and feeling like going to pass out x 1 week, worsening; denies fever; denies N/V; endorses dry cough that started this am and hyperglycemia; evaluated on 1/12 in urgent care for same; also c/o L flank pain; denies urinary symptoms; pain improves with leaning forward; hx DM, HLD, thyroid  disease

## 2023-10-08 NOTE — ED Notes (Signed)
 Pt called for repeat lab work 5x no response.

## 2023-10-08 NOTE — ED Provider Triage Note (Signed)
 Emergency Medicine Provider Triage Evaluation Note  Felicia Garrett , a 68 y.o. female  was evaluated in triage.  Pt complains of weakness and fatigue of 1 week duration.  Patient endorses cough that began today as well.  She is her primary care doctor for this weakness 4 days ago.  She was endorses epigastric pain and flank pain.  Review of Systems    Physical Exam  BP 119/86 (BP Location: Right Arm)   Pulse (!) 124   Temp 98.2 F (36.8 C)   Resp 20   Ht 5' 9 (1.753 m)   Wt 64 kg   SpO2 98%   BMI 20.82 kg/m  Gen:   Awake, no distress   Resp:  Normal effort  MSK:   Moves extremities without difficulty  Other:  Abdomen soft, no distention.  Does endorse tenderness.  Medical Decision Making  Medically screening exam initiated at 11:38 AM.  Appropriate orders placed.  Felicia Garrett was informed that the remainder of the evaluation will be completed by another provider, this initial triage assessment does not replace that evaluation, and the importance of remaining in the ED until their evaluation is complete.  Vague symptoms of weakness.  Patient is tachycardic.  No fever, normotensive.  Broad labs ordered including blood cultures and lactic acid.  CT imaging of the abdomen pelvis ordered.  Chest x-ray, viral swabs ordered.   Mannie Pac T, DO 10/08/23 1142

## 2023-10-08 NOTE — ED Notes (Signed)
 Verbal consent given for MSE

## 2023-10-09 ENCOUNTER — Other Ambulatory Visit (HOSPITAL_COMMUNITY): Payer: Self-pay

## 2023-10-09 ENCOUNTER — Emergency Department (HOSPITAL_COMMUNITY): Payer: Medicare PPO

## 2023-10-09 ENCOUNTER — Encounter (HOSPITAL_COMMUNITY): Payer: Self-pay | Admitting: Internal Medicine

## 2023-10-09 DIAGNOSIS — F32A Depression, unspecified: Secondary | ICD-10-CM | POA: Diagnosis present

## 2023-10-09 DIAGNOSIS — I10 Essential (primary) hypertension: Secondary | ICD-10-CM | POA: Diagnosis present

## 2023-10-09 DIAGNOSIS — K8689 Other specified diseases of pancreas: Secondary | ICD-10-CM | POA: Diagnosis present

## 2023-10-09 DIAGNOSIS — C252 Malignant neoplasm of tail of pancreas: Secondary | ICD-10-CM | POA: Diagnosis present

## 2023-10-09 DIAGNOSIS — E1110 Type 2 diabetes mellitus with ketoacidosis without coma: Secondary | ICD-10-CM | POA: Diagnosis present

## 2023-10-09 DIAGNOSIS — E876 Hypokalemia: Secondary | ICD-10-CM | POA: Diagnosis present

## 2023-10-09 DIAGNOSIS — E890 Postprocedural hypothyroidism: Secondary | ICD-10-CM | POA: Diagnosis present

## 2023-10-09 DIAGNOSIS — E440 Moderate protein-calorie malnutrition: Secondary | ICD-10-CM | POA: Diagnosis present

## 2023-10-09 DIAGNOSIS — I951 Orthostatic hypotension: Secondary | ICD-10-CM | POA: Diagnosis present

## 2023-10-09 DIAGNOSIS — Z8249 Family history of ischemic heart disease and other diseases of the circulatory system: Secondary | ICD-10-CM | POA: Diagnosis not present

## 2023-10-09 DIAGNOSIS — Z79890 Hormone replacement therapy (postmenopausal): Secondary | ICD-10-CM | POA: Diagnosis not present

## 2023-10-09 DIAGNOSIS — Z794 Long term (current) use of insulin: Secondary | ICD-10-CM | POA: Diagnosis not present

## 2023-10-09 DIAGNOSIS — I700 Atherosclerosis of aorta: Secondary | ICD-10-CM | POA: Diagnosis present

## 2023-10-09 DIAGNOSIS — E43 Unspecified severe protein-calorie malnutrition: Secondary | ICD-10-CM | POA: Diagnosis not present

## 2023-10-09 DIAGNOSIS — E785 Hyperlipidemia, unspecified: Secondary | ICD-10-CM | POA: Diagnosis present

## 2023-10-09 DIAGNOSIS — K5909 Other constipation: Secondary | ICD-10-CM | POA: Diagnosis present

## 2023-10-09 DIAGNOSIS — R531 Weakness: Secondary | ICD-10-CM | POA: Diagnosis not present

## 2023-10-09 DIAGNOSIS — E871 Hypo-osmolality and hyponatremia: Secondary | ICD-10-CM | POA: Diagnosis present

## 2023-10-09 DIAGNOSIS — Z1152 Encounter for screening for COVID-19: Secondary | ICD-10-CM | POA: Diagnosis not present

## 2023-10-09 DIAGNOSIS — Z79899 Other long term (current) drug therapy: Secondary | ICD-10-CM | POA: Diagnosis not present

## 2023-10-09 DIAGNOSIS — E119 Type 2 diabetes mellitus without complications: Secondary | ICD-10-CM | POA: Diagnosis not present

## 2023-10-09 DIAGNOSIS — Z7984 Long term (current) use of oral hypoglycemic drugs: Secondary | ICD-10-CM | POA: Diagnosis not present

## 2023-10-09 DIAGNOSIS — E860 Dehydration: Secondary | ICD-10-CM | POA: Diagnosis present

## 2023-10-09 DIAGNOSIS — E872 Acidosis, unspecified: Secondary | ICD-10-CM | POA: Diagnosis present

## 2023-10-09 DIAGNOSIS — Z833 Family history of diabetes mellitus: Secondary | ICD-10-CM | POA: Diagnosis not present

## 2023-10-09 DIAGNOSIS — Z7985 Long-term (current) use of injectable non-insulin antidiabetic drugs: Secondary | ICD-10-CM | POA: Diagnosis not present

## 2023-10-09 DIAGNOSIS — E039 Hypothyroidism, unspecified: Secondary | ICD-10-CM | POA: Diagnosis not present

## 2023-10-09 DIAGNOSIS — K869 Disease of pancreas, unspecified: Secondary | ICD-10-CM | POA: Diagnosis not present

## 2023-10-09 DIAGNOSIS — Z9884 Bariatric surgery status: Secondary | ICD-10-CM | POA: Diagnosis not present

## 2023-10-09 LAB — COMPREHENSIVE METABOLIC PANEL
ALT: 17 U/L (ref 0–44)
AST: 14 U/L — ABNORMAL LOW (ref 15–41)
Albumin: 3.2 g/dL — ABNORMAL LOW (ref 3.5–5.0)
Alkaline Phosphatase: 71 U/L (ref 38–126)
Anion gap: 21 — ABNORMAL HIGH (ref 5–15)
BUN: 13 mg/dL (ref 8–23)
CO2: 12 mmol/L — ABNORMAL LOW (ref 22–32)
Calcium: 8.5 mg/dL — ABNORMAL LOW (ref 8.9–10.3)
Chloride: 103 mmol/L (ref 98–111)
Creatinine, Ser: 0.95 mg/dL (ref 0.44–1.00)
GFR, Estimated: >60 mL/min (ref >60)
Glucose, Bld: 215 mg/dL — ABNORMAL HIGH (ref 70–99)
Potassium: 3.4 mmol/L — ABNORMAL LOW (ref 3.5–5.1)
Sodium: 136 mmol/L (ref 135–145)
Total Bilirubin: 1.3 mg/dL — ABNORMAL HIGH (ref 0.0–1.2)
Total Protein: 6.4 g/dL — ABNORMAL LOW (ref 6.5–8.1)

## 2023-10-09 LAB — GLUCOSE, CAPILLARY
Glucose-Capillary: 207 mg/dL — ABNORMAL HIGH (ref 70–99)
Glucose-Capillary: 172 mg/dL — ABNORMAL HIGH (ref 70–99)
Glucose-Capillary: 170 mg/dL — ABNORMAL HIGH (ref 70–99)
Glucose-Capillary: 168 mg/dL — ABNORMAL HIGH (ref 70–99)
Glucose-Capillary: 159 mg/dL — ABNORMAL HIGH (ref 70–99)
Glucose-Capillary: 143 mg/dL — ABNORMAL HIGH (ref 70–99)

## 2023-10-09 LAB — TROPONIN I (HIGH SENSITIVITY): Troponin I (High Sensitivity): 6 ng/L (ref <18)

## 2023-10-09 LAB — BETA-HYDROXYBUTYRIC ACID: Beta-Hydroxybutyric Acid: >8 mmol/L — ABNORMAL HIGH (ref 0.05–0.27)

## 2023-10-09 LAB — CBC
HCT: 39.3 % (ref 36.0–46.0)
Hemoglobin: 11.6 g/dL — ABNORMAL LOW (ref 12.0–15.0)
MCH: 20.6 pg — ABNORMAL LOW (ref 26.0–34.0)
MCHC: 29.5 g/dL — ABNORMAL LOW (ref 30.0–36.0)
MCV: 69.9 fL — ABNORMAL LOW (ref 80.0–100.0)
Platelets: 264 10*3/uL (ref 150–400)
RBC: 5.62 MIL/uL — ABNORMAL HIGH (ref 3.87–5.11)
RDW: 19.3 % — ABNORMAL HIGH (ref 11.5–15.5)
WBC: 7.2 10*3/uL (ref 4.0–10.5)
nRBC: 0 % (ref 0.0–0.2)

## 2023-10-09 LAB — CBG MONITORING, ED
Glucose-Capillary: 205 mg/dL — ABNORMAL HIGH (ref 70–99)
Glucose-Capillary: 200 mg/dL — ABNORMAL HIGH (ref 70–99)
Glucose-Capillary: 187 mg/dL — ABNORMAL HIGH (ref 70–99)
Glucose-Capillary: 266 mg/dL — ABNORMAL HIGH (ref 70–99)
Glucose-Capillary: 208 mg/dL — ABNORMAL HIGH (ref 70–99)

## 2023-10-09 LAB — I-STAT VENOUS BLOOD GAS, ED
Acid-base deficit: 16 mmol/L — ABNORMAL HIGH (ref 0.0–2.0)
Bicarbonate: 9.6 mmol/L — ABNORMAL LOW (ref 20.0–28.0)
Calcium, Ion: 1.16 mmol/L (ref 1.15–1.40)
HCT: 44 % (ref 36.0–46.0)
Hemoglobin: 15 g/dL (ref 12.0–15.0)
O2 Saturation: 81 %
Potassium: 3.6 mmol/L (ref 3.5–5.1)
Sodium: 134 mmol/L — ABNORMAL LOW (ref 135–145)
TCO2: 10 mmol/L — ABNORMAL LOW (ref 22–32)
pCO2, Ven: 23.5 mm[Hg] — ABNORMAL LOW (ref 44–60)
pH, Ven: 7.22 — ABNORMAL LOW (ref 7.25–7.43)
pO2, Ven: 53 mm[Hg] — ABNORMAL HIGH (ref 32–45)

## 2023-10-09 LAB — MAGNESIUM: Magnesium: 1.9 mg/dL (ref 1.7–2.4)

## 2023-10-09 LAB — PHOSPHORUS: Phosphorus: 1.9 mg/dL — ABNORMAL LOW (ref 2.5–4.6)

## 2023-10-09 LAB — LIPASE, BLOOD: Lipase: 37 U/L (ref 11–51)

## 2023-10-09 MED ORDER — ONDANSETRON HCL 4 MG/2ML IJ SOLN: 4.0000 mg | Freq: Once | INTRAMUSCULAR | Status: DC

## 2023-10-09 MED ORDER — BUPROPION HCL ER (XL) 150 MG PO TB24
150.0000 mg | ORAL_TABLET | Freq: Every day | ORAL | Status: DC
  Administered 2023-10-09 – 2023-10-12 (×4): 150 mg via ORAL
  Filled 2023-10-09 (×5): qty 1

## 2023-10-09 MED ORDER — ALBUTEROL SULFATE (2.5 MG/3ML) 0.083% IN NEBU: 2.5000 mg | INHALATION_SOLUTION | Freq: Four times a day (QID) | RESPIRATORY_TRACT | Status: DC | PRN

## 2023-10-09 MED ORDER — BOOST / RESOURCE BREEZE PO LIQD CUSTOM
1.0000 | Freq: Three times a day (TID) | ORAL | Status: DC
  Administered 2023-10-10: 1 via ORAL
  Administered 2023-10-10: 237 mL via ORAL
  Administered 2023-10-10 – 2023-10-12 (×3): 1 via ORAL

## 2023-10-09 MED ORDER — DEXTROSE 50 % IV SOLN: 0.0000 mL | INTRAVENOUS | Status: DC | PRN

## 2023-10-09 MED ORDER — LACTATED RINGERS IV BOLUS
20.0000 mL/kg | Freq: Once | INTRAVENOUS | Status: AC
  Administered 2023-10-09: 1000 mL via INTRAVENOUS

## 2023-10-09 MED ORDER — HYDRALAZINE HCL 20 MG/ML IJ SOLN: 10.0000 mg | Freq: Four times a day (QID) | INTRAMUSCULAR | Status: DC | PRN

## 2023-10-09 MED ORDER — GUAIFENESIN ER 600 MG PO TB12
600.0000 mg | ORAL_TABLET | Freq: Two times a day (BID) | ORAL | Status: DC
  Administered 2023-10-09 – 2023-10-12 (×3): 600 mg via ORAL
  Filled 2023-10-09 (×8): qty 1

## 2023-10-09 MED ORDER — LEVOTHYROXINE SODIUM 25 MCG PO TABS
137.0000 ug | ORAL_TABLET | Freq: Every day | ORAL | Status: DC
  Administered 2023-10-10 – 2023-10-13 (×4): 137 ug via ORAL
  Filled 2023-10-09 (×4): qty 1

## 2023-10-09 MED ORDER — SODIUM CHLORIDE 0.9 % IV BOLUS
1000.0000 mL | Freq: Once | INTRAVENOUS | Status: AC
  Administered 2023-10-09: 1000 mL via INTRAVENOUS

## 2023-10-09 MED ORDER — POTASSIUM CHLORIDE 10 MEQ/100ML IV SOLN
10.0000 meq | INTRAVENOUS | Status: DC
  Administered 2023-10-09: 10 meq via INTRAVENOUS
  Filled 2023-10-09: qty 100

## 2023-10-09 MED ORDER — POTASSIUM CHLORIDE 10 MEQ/100ML IV SOLN
10.0000 meq | Freq: Once | INTRAVENOUS | Status: AC
  Administered 2023-10-09: 10 meq via INTRAVENOUS
  Filled 2023-10-09: qty 100

## 2023-10-09 MED ORDER — ATORVASTATIN CALCIUM 40 MG PO TABS
40.0000 mg | ORAL_TABLET | Freq: Every evening | ORAL | Status: DC
  Administered 2023-10-09 – 2023-10-12 (×4): 40 mg via ORAL
  Filled 2023-10-09 (×4): qty 1

## 2023-10-09 MED ORDER — POLYETHYLENE GLYCOL 3350 17 G PO PACK: 17.0000 g | PACK | Freq: Every day | ORAL | Status: DC | PRN

## 2023-10-09 MED ORDER — ACETAMINOPHEN 325 MG PO TABS
650.0000 mg | ORAL_TABLET | Freq: Four times a day (QID) | ORAL | Status: DC | PRN
  Administered 2023-10-09: 650 mg via ORAL
  Filled 2023-10-09: qty 2

## 2023-10-09 MED ORDER — ENOXAPARIN SODIUM 40 MG/0.4ML IJ SOSY
40.0000 mg | PREFILLED_SYRINGE | INTRAMUSCULAR | Status: DC
Start: 2023-10-09 — End: 2023-10-10
  Administered 2023-10-09 – 2023-10-10 (×2): 40 mg via SUBCUTANEOUS
  Filled 2023-10-09 (×2): qty 0.4

## 2023-10-09 MED ORDER — LACTATED RINGERS IV SOLN: INTRAVENOUS | Status: DC

## 2023-10-09 MED ORDER — GADOBUTROL 1 MMOL/ML IV SOLN
6.0000 mL | Freq: Once | INTRAVENOUS | Status: AC | PRN
  Administered 2023-10-09: 6 mL via INTRAVENOUS

## 2023-10-09 MED ORDER — ACETAMINOPHEN 650 MG RE SUPP: 650.0000 mg | Freq: Four times a day (QID) | RECTAL | Status: DC | PRN

## 2023-10-09 MED ORDER — HYDROMORPHONE HCL 1 MG/ML IJ SOLN
1.0000 mg | INTRAMUSCULAR | Status: DC | PRN
  Administered 2023-10-10 – 2023-10-13 (×8): 1 mg via INTRAVENOUS
  Filled 2023-10-09 (×9): qty 1

## 2023-10-09 MED ORDER — BISACODYL 5 MG PO TBEC: 5.0000 mg | DELAYED_RELEASE_TABLET | Freq: Every day | ORAL | Status: DC | PRN

## 2023-10-09 MED ORDER — DEXTROSE IN LACTATED RINGERS 5 % IV SOLN: INTRAVENOUS | Status: DC

## 2023-10-09 MED ORDER — INSULIN REGULAR(HUMAN) IN NACL 100-0.9 UT/100ML-% IV SOLN
INTRAVENOUS | Status: DC
  Administered 2023-10-09: 5 [IU]/h via INTRAVENOUS
  Filled 2023-10-09 (×2): qty 100

## 2023-10-09 NOTE — ED Provider Notes (Signed)
 [place] EMERGENCY DEPARTMENT AT [facility] Provider Note   CSN: [id number] Arrival date & time: 10/08/23  1037     History  No chief complaint on file.   Felicia Garrett is a 68 y.o. female.  HPI   Patient has a history of anemia and diabetes quitter hypertension palpitations, fatigue who presents to the ED for evaluation of feeling weak like she is going to pass out for the past week.  Patient's had a bit of a cough.  She has also had some pain in her left flank area but no dysuria.  Patient was seen in urgent care on January 12.  At that time patient mentioned having trouble with fatigue for several months.  She also has been having difficulty with urinary frequency for about a week.  Patient did have a video visit with her primary care doctor on January 9 for similar symptoms.  Patient was noted to have elevated hemoglobin A1c and abnormal TSH and T4.  Patient did go to an urgent care on the 12th for her persistent symptoms.  Patient was treated with a bolus of IV fluids.  Recommendation was to follow-up with a primary care doctor  Home Medications Prior to Admission medications   Medication Sig Start Date End Date Taking? Authorizing Provider  atorvastatin  (LIPITOR) 40 MG tablet TAKE 1 TABLET BY MOUTH EVERY DAY 08/19/23   Cleave Curling, MD  buPROPion  (WELLBUTRIN  XL) 150 MG 24 hr tablet TAKE 1 TABLET BY MOUTH EVERY DAY 08/19/23   Cleave Curling, MD  DRYSOL 20 % external solution Apply topically daily as needed. 11/26/22   Cleave Curling, MD  insulin  glargine (LANTUS ) 100 UNIT/ML Solostar Pen Inject 6 units at bedtime, max titration 60 units 10/07/23   Cleave Curling, MD  Iron-FA-B Cmp-C-Biot-Probiotic (FUSION PLUS) CAPS TAKE 1 CAPSULE BY MOUTH ONCE DAILY BETWEEN MEALS 01/14/23   Cleave Curling, MD  levothyroxine  (SYNTHROID ) 137 MCG tablet Take 1 tablet (137 mcg total) by mouth daily before breakfast. 11/26/22   Cleave Curling, MD  losartan  (COZAAR ) 50 MG tablet TAKE 1 TABLET BY  MOUTH EVERY DAY 07/08/23   Cleave Curling, MD  methocarbamol  (ROBAXIN ) 750 MG tablet TAKE 1 TABLET BY MOUTH 3 TIMES DAILY. Patient taking differently: Take 750 mg by mouth every 6 (six) hours as needed. 08/08/22   Cleave Curling, MD  metoprolol  tartrate (LOPRESSOR ) 25 MG tablet Take 1/2 tablet (12.5mg ) by mouth 2 (two) times daily as needed for up to 30 doses (Palpitations). Hold if systolic blood pressure (top number) less than 100 mmHg or pulse less than 60 bpm. 08/02/23   Tolia, Sunit, DO  sodium fluoride (PREVIDENT 5000 PLUS) 1.1 % CREA dental cream Denta 5000 Plus 1.1 % cream  USE AS DIRECTED    [provider]  sucralfate  (CARAFATE ) 1 g tablet Take 1 tablet (1 g total) by mouth 4 (four) times daily -  with meals and at bedtime. Patient not taking: Reported on 08/21/2023 07/12/23   Burnette, Jennifer M, PA-C  tirzepatide  (MOUNJARO ) 10 MG/0.5ML Pen Inject 10 mg into the skin once a week. 10/03/23   Cleave Curling, MD  XIGDUO  XR 01-999 MG TB24 TAKE 2 TABLETS BY MOUTH EVERY MORNING 08/19/23   Cleave Curling, MD      Allergies    Penicillins    Review of Systems   Review of Systems  Physical Exam Updated Vital Signs BP 123/69 (BP Location: Right Arm)   Pulse (!) 104   Temp 97.6 F (36.4 C) (  Oral)   Resp 18   Ht 1.753 m (5\' 9" )   Wt 64 kg   SpO2 100%   BMI 20.82 kg/m  Physical Exam  ED Results / Procedures / Treatments   Labs (all labs ordered are listed, but only abnormal results are displayed) Labs Reviewed  URINALYSIS, ROUTINE W REFLEX MICROSCOPIC - Abnormal; Notable for the following components:      Result Value   APPearance HAZY (*)    Glucose, UA >=500 (*)    Ketones, ur 80 (*)    Protein, ur 30 (*)    Leukocytes,Ua MODERATE (*)    All other components within normal limits  BASIC METABOLIC PANEL - Abnormal; Notable for the following components:   CO2 14 (*)    Glucose, Bld 186 (*)    Anion gap 21 (*)    All other components within normal limits  CBC -  Abnormal; Notable for the following components:   RBC 6.82 (*)    HCT 48.3 (*)    MCV 70.8 (*)    MCH 19.9 (*)    MCHC 28.2 (*)    RDW 19.9 (*)    All other components within normal limits  CBG MONITORING, ED - Abnormal; Notable for the following components:   Glucose-Capillary 221 (*)    All other components within normal limits  I-STAT CG4 LACTIC ACID, ED - Abnormal; Notable for the following components:   Lactic Acid, Venous 2.2 (*)    All other components within normal limits  I-STAT VENOUS BLOOD GAS, ED - Abnormal; Notable for the following components:   pH, Ven 7.220 (*)    pCO2, Ven 23.5 (*)    pO2, Ven 53 (*)    Bicarbonate 9.6 (*)    TCO2 10 (*)    Acid-base deficit 16.0 (*)    Sodium 134 (*)    All other components within normal limits  RESP PANEL BY RT-PCR (RSV, FLU A&B, COVID)  RVPGX2  CULTURE, BLOOD (ROUTINE X 2)  CULTURE, BLOOD (ROUTINE X 2)  BRAIN NATRIURETIC PEPTIDE  BETA-HYDROXYBUTYRIC ACID  LIPASE, BLOOD  CBC  COMPREHENSIVE METABOLIC PANEL  PHOSPHORUS  MAGNESIUM  CBG MONITORING, ED  I-STAT CG4 LACTIC ACID, ED  TROPONIN I (HIGH SENSITIVITY)  TROPONIN I (HIGH SENSITIVITY)    EKG EKG Interpretation Date/Time:  Tuesday October 08 2023 11:32:29 EST Ventricular Rate:  117 PR Interval:  128 QRS Duration:  68 QT Interval:  312 QTC Calculation: 435 R Axis:   23  Text Interpretation: Sinus tachycardia Otherwise normal ECG When compared with ECG of 23-Oct-2008 04:33, PREVIOUS ECG IS PRESENT Confirmed by Afton Horse [phone]) on 10/08/2023 11:32:59 AM  Radiology CT ABDOMEN PELVIS W CONTRAST Result Date: 10/08/2023 CLINICAL DATA:  Acute abdominal pain.  Weakness. EXAM: CT ABDOMEN AND PELVIS WITH CONTRAST TECHNIQUE: Multidetector CT imaging of the abdomen and pelvis was performed using the standard protocol following bolus administration of intravenous contrast. RADIATION DOSE REDUCTION: This exam was performed according to the departmental dose-optimization  program which includes automated exposure control, adjustment of the mA and/or kV according to patient size and/or use of iterative reconstruction technique. CONTRAST:  75mL OMNIPAQUE  IOHEXOL  350 MG/ML SOLN COMPARISON:  Right upper quadrant ultrasound 08/13/2023 FINDINGS: Lower chest: No acute basilar airspace disease. No pleural effusion. Hepatobiliary: Scattered tiny hepatic hypodensities are too small to characterize. 17 mm hypodense lesion in the posterior right hepatic lobe series 3, image 21. Mild diffuse hepatic steatosis. Possible layering sludge in the gallbladder, no calcified gallstone.  Common bile duct is poorly defined, appears to be upper normal at 7 mm. Pancreas: Abnormal appearance of the pancreas with diffuse soft tissue thickening of the pancreatic body, series 3, image 25 and ill-defined low-density in the region of the pancreatic tail, series 3, image 19. Pancreatic tail abnormality spans approximately 5 cm in length. This abnormal density likely encases branches of the celiac artery, in causes mass a effect and possible invasion on the superior mesenteric vein, series 3, image 26. There is no definite peripancreatic inflammation. Spleen: Normal in size without focal abnormality. Adrenals/Urinary Tract: The adrenal glands are grossly normal, poorly defined. There is no hydronephrosis. No renal calculi. Left renal cyst. No further follow-up imaging is recommended. Partially distended urinary bladder, normal for degree of distension. Stomach/Bowel: Gastric band in place. The catheter of the gastric band is intimately related to the transverse colon, series 3, image 36. This may be within a haustral fold, however it is difficult to exclude erosion of the catheter into the colon. No associated wall thickening. There is no bowel obstruction or inflammation. Small to moderate volume of colonic stool. The appendix is not definitively seen. Vascular/Lymphatic: Aortic atherosclerosis without aneurysm.  There is a filling defect in the extrahepatic portal vein at the SMV portal confluence, series 3, image 25, this is contiguous with pancreatic soft tissue density. This also causes mass effect on the SMV series 3, image 28. There is no bulky abdominopelvic adenopathy. Reproductive: Calcified uterine fibroids.  No adnexal mass. Other: No ascites or free air.  No definite omental thickening. Musculoskeletal: No suspicious bone lesion or acute osseous findings. IMPRESSION: 1. Abnormal appearance of the pancreas which is suspicious for pancreatic neoplasm. Recommend further assessment with pancreatic protocol MRI. This causes mass effect on the superior mesenteric vein. Filling defect within the portal vein at the portal splenic confluence is contiguous with the abnormal pancreas and may represent invasion. 2. Hypodense liver lesions are nonspecific and can be assessed on MRI. 3. Gastric band in place. Portions of the band are intimately related to the transverse colon, presumably within a haustral fold, however difficult to exclude catheter invasion into the lumen. Aortic Atherosclerosis (ICD10-I70.0). Electronically Signed   By: Chadwick Colonel M.D.   On: 10/08/2023 21:53   DG Chest 2 View Result Date: 10/08/2023 CLINICAL DATA:  Cough. EXAM: CHEST - 2 VIEW COMPARISON:  01/31/2018. FINDINGS: Bilateral lung fields are clear. Bilateral costophrenic angles are clear. Normal cardio-mediastinal silhouette. No acute osseous abnormalities. The soft tissues are within normal limits. Gastric lap band is seen. IMPRESSION: No active cardiopulmonary disease. Electronically Signed   By: Beula Brunswick M.D.   On: 10/08/2023 12:09    Procedures Procedures    Medications Ordered in ED Medications  ondansetron  (ZOFRAN ) injection 4 mg (4 mg Intravenous Patient Refused/Not Given 10/09/23 0923)  guaiFENesin  (MUCINEX ) 12 hr tablet 600 mg (has no administration in time range)  lactated ringers  bolus 1,280 mL (has no  administration in time range)  insulin  regular, human (MYXREDLIN ) 100 units/ 100 mL infusion (has no administration in time range)  lactated ringers  infusion (has no administration in time range)  dextrose  5 % in lactated ringers  infusion (has no administration in time range)  dextrose  50 % solution 0-50 mL (has no administration in time range)  potassium chloride  10 mEq in 100 mL IVPB (has no administration in time range)  acetaminophen  (TYLENOL ) tablet 650 mg (has no administration in time range)    Or  acetaminophen  (TYLENOL ) suppository 650 mg (has  no administration in time range)  polyethylene glycol (MIRALAX  / GLYCOLAX ) packet 17 g (has no administration in time range)  bisacodyl  (DULCOLAX) EC tablet 5 mg (has no administration in time range)  albuterol  (PROVENTIL ) (2.5 MG/3ML) 0.083% nebulizer solution 2.5 mg (has no administration in time range)  hydrALAZINE  (APRESOLINE ) injection 10 mg (has no administration in time range)  enoxaparin  (LOVENOX ) injection 40 mg (has no administration in time range)  iohexol  (OMNIPAQUE ) 350 MG/ML injection 75 mL (75 mLs Intravenous Contrast Given 10/08/23 2109)  sodium chloride  0.9 % bolus 1,000 mL (1,000 mLs Intravenous New Bag/Given 10/09/23 0856)  gadobutrol  (GADAVIST ) 1 MMOL/ML injection 6 mL (6 mLs Intravenous Contrast Given 10/09/23 0932)    ED Course/ Medical Decision Making/ A&P Clinical Course as of 10/09/23 1008  Wed Oct 09, 2023  0832 Labs reviewed.  Urinalysis does suggest possibility of infection with leukocyte esterase and increased white blood cells.  Metabolic panel notable for decreased bicarb elevated anion gap.  Blood sugar only mildly elevated [JK]  1008 Case discussed with Dr. Lyndol Santee regarding admission [JK]    Clinical Course User Index [JK] Trish Furl, MD                                 Medical Decision Making Problems Addressed: Metabolic acidosis: acute illness or injury that poses a threat to life or bodily  functions Pancreatic mass: acute illness or injury that poses a threat to life or bodily functions Weakness: acute illness or injury that poses a threat to life or bodily functions  Amount and/or Complexity of Data Reviewed Labs: ordered. Decision-making details documented in ED Course. Radiology: ordered and independent interpretation performed.  Risk OTC drugs. Prescription drug management. Decision regarding hospitalization.   Patient presented to the ED for evaluation of persistent weakness nausea decreased appetite.  Patient's CT scan shows an abnormal appearance of her pancreas concerning for possible pancreatic neoplasm.  Was also question of a filling defect within the portal vein at the portal splenic confluence.  Patient also was noted to have hypodense liver lesions that are nonspecific but can be related further with MRI.  There is also evidence of a gastric band in place where portions are intimately related to the transverse colon presumably within Hellstrom fold although difficult to exclude catheter invasion into the lumen.  Patient's x-rays of her chest does not show any acute abnormality.  Will add a MRI as well as a lipase.  Her laboratory tests are notable for increased hemoglobin suggesting hemoconcentration.  Patient also has evidence of decreased bicarb with mildly elevated blood sugars.  Possible she has euglycemic DKA associated with her SGL 2 inhibitor.  Starvation ketosis is also a concern considering her persistent nausea and decreased appetite.  Will plan on adding a venous blood gas as well as beta hydroxybutyric acid.  Plan on hydrating the patient with 1 L of normal saline.  Hold her SGL 2 inhibitor for now.  With her increasing weakness fatigue abnormal findings I will consult the medical service for admission        Final Clinical Impression(s) / ED Diagnoses Final diagnoses:  Metabolic acidosis  Pancreatic mass  Weakness    Rx / DC Orders ED Discharge  Orders     None         Trish Furl, MD 10/09/23 1008

## 2023-10-09 NOTE — H&P (Signed)
 [facility] History and Physical  Felicia Garrett XBM:[id number] DOB: Jul 12, 1956 DOA: 10/08/2023 PCP: Cleave Curling, MD  Presented from: home Chief Complaint: Abdominal pain, no appetite, generalized weakness  History of Present Illness: Felicia Garrett is a 68 y.o. female with PMH significant for h/o gastric band surgery, DM2, HLD, palpitations, anemia, depression who has been on Mounjaro  for last 1 year and has lost about 100 pound. 1/14, patient presented to the ED with complaint of progressively worsening generalized weakness, abdominal pain Her symptoms started 2 weeks ago with epigastric pain, poor appetite.  Denies any nausea, vomiting, diarrhea, fever.  She completely lost her appetite and generalized weakness worsened.   1/9, she had a televisit with her PCP and went to an urgent care center on 1/12 with persistence of symptoms she was given IV fluid bolus and recommended to follow-up with PCP.  Symptoms worsen and hence presented to the ED yesterday.  In the ED, patient was afebrile, heart rate 124, blood pressure stable Labs showed WBC 8.2, hemoglobin 13.6, glucose elevated to 186, serum bicarb low at 14, troponin not elevated, lactic acid elevated 2.2 Urinalysis showed hazy yellow urine with moderate leukocytes Blood culture sent Chest x-ray unremarkable  respiratory virus panel unremarkable CT abdomen pelvis with contrast showed - Abnormal appearance of the pancreas which is suspicious for pancreatic neoplasm.  Possible mass effect on the superior mesenteric vein. Filling defect within the portal vein at the portal splenic confluence is contiguous with the abnormal pancreas and may represent invasion. - Hypodense liver lesions are nonspecific and can be assessed on MRI.  It seems, she had to wait several hours overnight before she got seen by ED attending this morning. VBG was obtained this morning which showed acidotic pH at 7.22, pCO2 23.5, serum bicarb 9.6,  beta-hydroxybutyrate acid elevated to more than 8  She was started on IV fluid Hospitalist service consulted for inpatient management  MRI abdomen this morning showed -Heterogeneous moderately enhancing mass in the pancreatic body/tail concerning for neoplasm -Abrupt narrowing of the superior mesenteric vein/portal vein  At the time of my evaluation, patient was curled up in bed with abdominal pain and weakness. Looks sick, vital signs stable so far History reviewed and detailed as above Stat labs sent I started the patient on DKA protocol with IV insulin  drip, IV dextrose  Repeat labs ordered   Review of Systems:  All systems were reviewed and were negative unless otherwise mentioned in the HPI   Past medical history: Past Medical History:  Diagnosis Date   Anemia    Arthritis    Atherosclerosis of aorta (HCC)    Depression    Diabetes mellitus    Hyperlipidemia    Thyroid  nodule     Past surgical history: Past Surgical History:  Procedure Laterality Date   LAPAROSCOPIC GASTRIC BANDING  06/14/08   THYROIDECTOMY N/A 10/08/2014   Procedure: NEAR TOTAL THYROIDECTOMY;  Surgeon: Azucena Bollard, MD;  Location: [facility];  Service: General;  Laterality: N/A;   UTERINE FIBROID EMBOLIZATION  10 yrs ago    Social History:  reports that she has never smoked. She has never used smokeless tobacco. She reports that she does not drink alcohol and does not use drugs.  Allergies:  Allergies  Allergen Reactions   Penicillins Shortness Of Breath    Chest pain   Penicillins   Family history:  Family History  Problem Relation Age of Onset   Hypertension Mother    Hyperlipidemia Mother    Hyperthyroidism Mother  Diabetes Father    Thyroid  disease Sister    Hyperlipidemia Brother    Hypertension Brother    Diabetes Brother      Physical Exam: Vitals:   10/08/23 1122 10/08/23 1801 10/09/23 0300 10/09/23 0901  BP:  118/71 121/75 123/69  Pulse:  (!) 106 (!) 101 (!) 104   Resp:  16 17 18   Temp:  97.7 F (36.5 C) 97.8 F (36.6 C) 97.6 F (36.4 C)  TempSrc:  Oral  Oral  SpO2:  94% 98% 100%  Weight: 64 kg     Height: 5\' 9"  (1.753 m)      Wt Readings from Last 3 Encounters:  10/08/23 64 kg  10/03/23 64 kg  08/21/23 70.5 kg   Body mass index is 20.82 kg/m.  General exam: Pleasant, elderly African-American female.  Calm up in bed because of pain Skin: No rashes, lesions or ulcers. HEENT: Atraumatic, normocephalic, no obvious bleeding Lungs: Clear to auscultation bilaterally CVS: S1, S2, no murmur,   GI/Abd: Soft, epigastric tenderness present, nondistended, bowel sound present,   CNS: Alert, awake, oriented x 3 Psychiatry: Looks very depressed Extremities: No pedal edema, no calf tenderness,    ----------------------------------------------------------------------------------------------------------------------------------------- ----------------------------------------------------------------------------------------------------------------------------------------- -----------------------------------------------------------------------------------------------------------------------------------------  Assessment/Plan: Acute metabolic acidosis Euglycemic DKA Starvation ketoacidosis Acute acidosis is likely because of the combination of euglycemic DKA due to dapagliflozin.  Also has a component of starvation ketoacidosis.   VBG with pH 7.22, serum bicarb level low at 9.6, beta-hydroxybutyrate level elevated to 8, anion gap 22 Started on DKA protocol with insulin  drip, IV fluids, frequent BMP Patient does not have nausea vomiting but says she has no appetite at all.  I have ordered for clear liquid diet.  If she is able to tolerate and labs improve, can switch to subcu insulin .  Type 2 diabetes mellitus uncontrolled A1c 9.9 on 10/04/2023 PTA meds-Mounjaro  12.5 mg subcu weekly, dapagliflozin 5 mg and metformin 1000 mg daily.  Meds on  hold Currently on insulin  drip.   Recent Labs  Lab 10/08/23 1135 10/09/23 1053 10/09/23 1210  GLUCAP 221* 205* 200*   Suspected pancreatic neoplasm Based on CT scan and MRI finding as above, patient could be having pancreatic neoplasm.   Probably because of complete loss of appetite. Lipase level ordered. May need EUS and endoscopic biopsy.  Discussed with GI Dr. Tova Fresh for consult.  Depression Wellbutrin .   Hypothyroidism Synthroid  137 mcg daily.    Mobility: Encourage ambulation.  Feels weak.  PT eval  Goals of care:   Code Status: Full Code    DVT prophylaxis:  enoxaparin  (LOVENOX ) injection 40 mg Start: 10/09/23 0945   Antimicrobials: None Fluid: Per DKA protocol Consultants: GI Family Communication: None at bedside  Dispo: The patient is from: Home              Anticipated d/c is to: Home ultimately  Diet: Diet Order             Diet clear liquid Room service appropriate? Yes; Fluid consistency: Thin  Diet effective now                    ------------------------------------------------------------------------------------- Severity of Illness: The appropriate patient status for this patient is INPATIENT. Inpatient status is judged to be reasonable and necessary in order to provide the required intensity of service to ensure the patient's safety. The patient's presenting symptoms, physical exam findings, and initial radiographic and laboratory data in the context of their chronic comorbidities is felt to place them at  high risk for further clinical deterioration. Furthermore, it is not anticipated that the patient will be medically stable for discharge from the hospital within 2 midnights of admission.   * I certify that at the point of admission it is my clinical judgment that the patient will require inpatient hospital care spanning beyond 2 midnights from the point of admission due to high intensity of service, high risk for further deterioration and high  frequency of surveillance required.* -------------------------------------------------------------------------------------   Home Meds: Prior to Admission medications   Medication Sig Start Date End Date Taking? Authorizing Provider  atorvastatin  (LIPITOR) 40 MG tablet TAKE 1 TABLET BY MOUTH EVERY DAY 08/19/23   Cleave Curling, MD  buPROPion  (WELLBUTRIN  XL) 150 MG 24 hr tablet TAKE 1 TABLET BY MOUTH EVERY DAY 08/19/23   Cleave Curling, MD  DRYSOL 20 % external solution Apply topically daily as needed. 11/26/22   Cleave Curling, MD  insulin  glargine (LANTUS ) 100 UNIT/ML Solostar Pen Inject 6 units at bedtime, max titration 60 units 10/07/23   Cleave Curling, MD  Iron-FA-B Cmp-C-Biot-Probiotic (FUSION PLUS) CAPS TAKE 1 CAPSULE BY MOUTH ONCE DAILY BETWEEN MEALS 01/14/23   Cleave Curling, MD  levothyroxine  (SYNTHROID ) 137 MCG tablet Take 1 tablet (137 mcg total) by mouth daily before breakfast. 11/26/22   Cleave Curling, MD  losartan  (COZAAR ) 50 MG tablet TAKE 1 TABLET BY MOUTH EVERY DAY 07/08/23   Cleave Curling, MD  methocarbamol  (ROBAXIN ) 750 MG tablet TAKE 1 TABLET BY MOUTH 3 TIMES DAILY. Patient taking differently: Take 750 mg by mouth every 6 (six) hours as needed. 08/08/22   Cleave Curling, MD  metoprolol  tartrate (LOPRESSOR ) 25 MG tablet Take 1/2 tablet (12.5mg ) by mouth 2 (two) times daily as needed for up to 30 doses (Palpitations). Hold if systolic blood pressure (top number) less than 100 mmHg or pulse less than 60 bpm. 08/02/23   Tolia, Sunit, DO  sodium fluoride (PREVIDENT 5000 PLUS) 1.1 % CREA dental cream Denta 5000 Plus 1.1 % cream  USE AS DIRECTED    [provider]  sucralfate  (CARAFATE ) 1 g tablet Take 1 tablet (1 g total) by mouth 4 (four) times daily -  with meals and at bedtime. Patient not taking: Reported on 08/21/2023 07/12/23   Angelia Kelp, PA-C  tirzepatide  (MOUNJARO ) 10 MG/0.5ML Pen Inject 10 mg into the skin once a week. 10/03/23   Cleave Curling, MD  XIGDUO   XR 01-999 MG TB24 TAKE 2 TABLETS BY MOUTH EVERY MORNING 08/19/23   Cleave Curling, MD    Labs on Admission:   CBC: Recent Labs  Lab 10/04/23 1102 10/08/23 1128 10/09/23 0858  WBC 4.9 8.2  --   HGB 12.2 13.6 15.0  HCT 42.8 48.3* 44.0  MCV 72* 70.8*  --   PLT 301 327  --     Basic Metabolic Panel: Recent Labs  Lab 10/04/23 1102 10/08/23 1128 10/09/23 0858  NA 138 136 134*  K 4.5 4.0 3.6  CL 94* 101  --   CO2 22 14*  --   GLUCOSE 221* 186*  --   BUN 10 13  --   CREATININE 0.73 0.98  --   CALCIUM  10.2 9.2  --     Liver Function Tests: Recent Labs  Lab 10/04/23 1102  AST 16  ALT 19  ALKPHOS 105  BILITOT 0.6  PROT 6.8  ALBUMIN  4.3   No results for input(s): "LIPASE", "AMYLASE" in the last 168 hours. No results for input(s): "AMMONIA" in the last  168 hours.  Cardiac Enzymes: No results for input(s): "CKTOTAL", "CKMB", "CKMBINDEX", "TROPONINI" in the last 168 hours.  BNP (last 3 results) Recent Labs    10/08/23 1200  BNP 20.0    ProBNP (last 3 results) No results for input(s): "PROBNP" in the last 8760 hours.  CBG: Recent Labs  Lab 10/08/23 1135 10/09/23 1053 10/09/23 1210  GLUCAP 221* 205* 200*    Lipase     Component Value Date/Time   LIPASE 19 10/23/2008 0500     Urinalysis    Component Value Date/Time   COLORURINE YELLOW 10/08/2023 1141   APPEARANCEUR HAZY (A) 10/08/2023 1141   LABSPEC 1.028 10/08/2023 1141   PHURINE 5.0 10/08/2023 1141   GLUCOSEU >=500 (A) 10/08/2023 1141   HGBUR NEGATIVE 10/08/2023 1141   BILIRUBINUR NEGATIVE 10/08/2023 1141   BILIRUBINUR small (A) 10/06/2023 1507   BILIRUBINUR negative 08/23/2021 1627   KETONESUR 80 (A) 10/08/2023 1141   PROTEINUR 30 (A) 10/08/2023 1141   UROBILINOGEN 0.2 10/06/2023 1507   UROBILINOGEN 0.2 10/23/2008 0448   NITRITE NEGATIVE 10/08/2023 1141   LEUKOCYTESUR MODERATE (A) 10/08/2023 1141     Drugs of Abuse  No results found for: "LABOPIA", "COCAINSCRNUR", "LABBENZ",  "AMPHETMU", "THCU", "LABBARB"    Radiological Exams on Admission: MR Abdomen W or Wo Contrast Result Date: 10/09/2023 CLINICAL DATA:  Abdominal mass, intra-abdominal neoplasm suspected;. Abdominal pain. EXAM: MRI ABDOMEN WITHOUT AND WITH CONTRAST TECHNIQUE: Multiplanar multisequence MR imaging of the abdomen was performed both before and after the administration of intravenous contrast. CONTRAST:  6mL GADAVIST  GADOBUTROL  1 MMOL/ML IV SOLN COMPARISON:  CT scan abdomen and pelvis from yesterday. FINDINGS: Lower chest: Unremarkable MR appearance to the lung bases. No pleural effusion. No pericardial effusion. Normal heart size. Hepatobiliary: The liver is normal in size and configuration. There is a 1.5 x 1.9 cm simple cyst in the right hepatic lobe, segment 6. There are additional several subcentimeter sized cysts scattered throughout the liver. No intrahepatic or extrahepatic bile duct dilatation. No choledocholithiasis. Unremarkable gallbladder. Pancreas: There is heterogeneous, mildly T2 hyperintense, moderately enhancing mass lesion completely occupying the pancreatic body/tail. The lesion measures at least 3.5 x 7.9 cm orthogonally on axial plane. The pancreatic head and uncinate process are relatively spared in appear grossly within normal limits. The lesion causes abrupt narrowing of the superior mesenteric vein and portal vein at the level of confluence. However, postcontrast images are markedly motion degraded and finding of nonocclusive thrombus in the portal vein at the confluence can not be confirmed on this exam. Spleen:  Within normal limits in size and appearance. No focal mass. Adrenals/Urinary Tract: Unremarkable adrenal glands. No hydroureteronephrosis. No suspicious renal mass. There is a 3.9 x 4.0 cm simple cyst in the left kidney. There is a sinus cyst in the right kidney lower pole. Stomach/Bowel: Note is made of gastric lap band. Visualized portions within the abdomen are unremarkable. No  disproportionate dilation of bowel loops. Vascular/Lymphatic: No pathologically enlarged lymph nodes identified. No abdominal aortic aneurysm demonstrated. No ascites. Other:  None. Musculoskeletal: There are multiple, T2 hyperintense lesions in the lumbar spine (L2, L4 and L5 vertebrae) and sacrum (marked with electronic arrow sign on series 11). These are favored to represent hemangioma/bone cysts. Attention on follow-up examination is recommended. IMPRESSION: 1. Heterogeneous moderately enhancing mass centered in the pancreatic body/tail, is highly concerning for neoplastic process. Differential diagnosis also includes mass forming chronic pancreatitis, however, preferred less likely. Correlate clinically along with tumor markers and tissue sampling. 2. There is  abrupt narrowing of the superior mesenteric vein/portal vein at the level of confluence, as discussed above. However, postcontrast images are markedly motion degraded and the finding of nonocclusive thrombus in the portal vein can not be confirmed on this exam. 3. No metastatic disease identified within the abdomen. 4. Multiple other nonacute observations, as described above. Electronically Signed   By: Beula Brunswick M.D.   On: 10/09/2023 12:26   MR 3D Recon At Scanner Result Date: 10/09/2023 CLINICAL DATA:  Abdominal mass, intra-abdominal neoplasm suspected;. Abdominal pain. EXAM: MRI ABDOMEN WITHOUT AND WITH CONTRAST TECHNIQUE: Multiplanar multisequence MR imaging of the abdomen was performed both before and after the administration of intravenous contrast. CONTRAST:  6mL GADAVIST  GADOBUTROL  1 MMOL/ML IV SOLN COMPARISON:  CT scan abdomen and pelvis from yesterday. FINDINGS: Lower chest: Unremarkable MR appearance to the lung bases. No pleural effusion. No pericardial effusion. Normal heart size. Hepatobiliary: The liver is normal in size and configuration. There is a 1.5 x 1.9 cm simple cyst in the right hepatic lobe, segment 6. There are additional  several subcentimeter sized cysts scattered throughout the liver. No intrahepatic or extrahepatic bile duct dilatation. No choledocholithiasis. Unremarkable gallbladder. Pancreas: There is heterogeneous, mildly T2 hyperintense, moderately enhancing mass lesion completely occupying the pancreatic body/tail. The lesion measures at least 3.5 x 7.9 cm orthogonally on axial plane. The pancreatic head and uncinate process are relatively spared in appear grossly within normal limits. The lesion causes abrupt narrowing of the superior mesenteric vein and portal vein at the level of confluence. However, postcontrast images are markedly motion degraded and finding of nonocclusive thrombus in the portal vein at the confluence can not be confirmed on this exam. Spleen:  Within normal limits in size and appearance. No focal mass. Adrenals/Urinary Tract: Unremarkable adrenal glands. No hydroureteronephrosis. No suspicious renal mass. There is a 3.9 x 4.0 cm simple cyst in the left kidney. There is a sinus cyst in the right kidney lower pole. Stomach/Bowel: Note is made of gastric lap band. Visualized portions within the abdomen are unremarkable. No disproportionate dilation of bowel loops. Vascular/Lymphatic: No pathologically enlarged lymph nodes identified. No abdominal aortic aneurysm demonstrated. No ascites. Other:  None. Musculoskeletal: There are multiple, T2 hyperintense lesions in the lumbar spine (L2, L4 and L5 vertebrae) and sacrum (marked with electronic arrow sign on series 11). These are favored to represent hemangioma/bone cysts. Attention on follow-up examination is recommended. IMPRESSION: 1. Heterogeneous moderately enhancing mass centered in the pancreatic body/tail, is highly concerning for neoplastic process. Differential diagnosis also includes mass forming chronic pancreatitis, however, preferred less likely. Correlate clinically along with tumor markers and tissue sampling. 2. There is abrupt narrowing of  the superior mesenteric vein/portal vein at the level of confluence, as discussed above. However, postcontrast images are markedly motion degraded and the finding of nonocclusive thrombus in the portal vein can not be confirmed on this exam. 3. No metastatic disease identified within the abdomen. 4. Multiple other nonacute observations, as described above. Electronically Signed   By: Beula Brunswick M.D.   On: 10/09/2023 12:26   CT ABDOMEN PELVIS W CONTRAST Result Date: 10/08/2023 CLINICAL DATA:  Acute abdominal pain.  Weakness. EXAM: CT ABDOMEN AND PELVIS WITH CONTRAST TECHNIQUE: Multidetector CT imaging of the abdomen and pelvis was performed using the standard protocol following bolus administration of intravenous contrast. RADIATION DOSE REDUCTION: This exam was performed according to the departmental dose-optimization program which includes automated exposure control, adjustment of the mA and/or kV according to patient size and/or  use of iterative reconstruction technique. CONTRAST:  75mL OMNIPAQUE  IOHEXOL  350 MG/ML SOLN COMPARISON:  Right upper quadrant ultrasound 08/13/2023 FINDINGS: Lower chest: No acute basilar airspace disease. No pleural effusion. Hepatobiliary: Scattered tiny hepatic hypodensities are too small to characterize. 17 mm hypodense lesion in the posterior right hepatic lobe series 3, image 21. Mild diffuse hepatic steatosis. Possible layering sludge in the gallbladder, no calcified gallstone. Common bile duct is poorly defined, appears to be upper normal at 7 mm. Pancreas: Abnormal appearance of the pancreas with diffuse soft tissue thickening of the pancreatic body, series 3, image 25 and ill-defined low-density in the region of the pancreatic tail, series 3, image 19. Pancreatic tail abnormality spans approximately 5 cm in length. This abnormal density likely encases branches of the celiac artery, in causes mass a effect and possible invasion on the superior mesenteric vein, series 3,  image 26. There is no definite peripancreatic inflammation. Spleen: Normal in size without focal abnormality. Adrenals/Urinary Tract: The adrenal glands are grossly normal, poorly defined. There is no hydronephrosis. No renal calculi. Left renal cyst. No further follow-up imaging is recommended. Partially distended urinary bladder, normal for degree of distension. Stomach/Bowel: Gastric band in place. The catheter of the gastric band is intimately related to the transverse colon, series 3, image 36. This may be within a haustral fold, however it is difficult to exclude erosion of the catheter into the colon. No associated wall thickening. There is no bowel obstruction or inflammation. Small to moderate volume of colonic stool. The appendix is not definitively seen. Vascular/Lymphatic: Aortic atherosclerosis without aneurysm. There is a filling defect in the extrahepatic portal vein at the SMV portal confluence, series 3, image 25, this is contiguous with pancreatic soft tissue density. This also causes mass effect on the SMV series 3, image 28. There is no bulky abdominopelvic adenopathy. Reproductive: Calcified uterine fibroids.  No adnexal mass. Other: No ascites or free air.  No definite omental thickening. Musculoskeletal: No suspicious bone lesion or acute osseous findings. IMPRESSION: 1. Abnormal appearance of the pancreas which is suspicious for pancreatic neoplasm. Recommend further assessment with pancreatic protocol MRI. This causes mass effect on the superior mesenteric vein. Filling defect within the portal vein at the portal splenic confluence is contiguous with the abnormal pancreas and may represent invasion. 2. Hypodense liver lesions are nonspecific and can be assessed on MRI. 3. Gastric band in place. Portions of the band are intimately related to the transverse colon, presumably within a haustral fold, however difficult to exclude catheter invasion into the lumen. Aortic Atherosclerosis  (ICD10-I70.0). Electronically Signed   By: Chadwick Colonel M.D.   On: 10/08/2023 21:53   DG Chest 2 View Result Date: 10/08/2023 CLINICAL DATA:  Cough. EXAM: CHEST - 2 VIEW COMPARISON:  01/31/2018. FINDINGS: Bilateral lung fields are clear. Bilateral costophrenic angles are clear. Normal cardio-mediastinal silhouette. No acute osseous abnormalities. The soft tissues are within normal limits. Gastric lap band is seen. IMPRESSION: No active cardiopulmonary disease. Electronically Signed   By: Beula Brunswick M.D.   On: 10/08/2023 12:09     Signed, Hoyt Macleod, MD [facility] 10/09/2023

## 2023-10-09 NOTE — ED Notes (Signed)
 ED TO INPATIENT HANDOFF REPORT  ED Nurse Name and Phone #: Ethyl Hering [phone]  S Name/Age/Gender Felicia Garrett 68 y.o. female Room/Bed: 012C/012C  Code Status   Code Status: Full Code  Home/SNF/Other Home Patient oriented to: self, place, time, and situation Is this baseline? Yes   Triage Complete: Triage complete  Chief Complaint DKA (diabetic ketoacidosis) (HCC) [E11.10]  Triage Note Pt c/o generalized weakness and feeling like going to pass out x 1 week, worsening; denies fever; denies N/V; endorses dry cough that started this am and hyperglycemia; evaluated on 1/12 in urgent care for same; also c/o L flank pain; denies urinary symptoms; pain improves with leaning forward; hx DM, HLD, thyroid  disease   Allergies Allergies  Allergen Reactions   Penicillins Shortness Of Breath    Chest pain    Level of Care/Admitting Diagnosis ED Disposition     ED Disposition  Admit   Condition  --   Comment  Hospital Area: [facility] [[id number]]  Level of Care: Progressive [102]  Admit to Progressive based on following criteria: MULTISYSTEM THREATS such as stable sepsis, metabolic/electrolyte imbalance with or without encephalopathy that is responding to early treatment.  May admit patient to [facility] or [facility] if equivalent level of care is available:: Yes  Covid Evaluation: Asymptomatic - no recent exposure (last 10 days) testing not required  Diagnosis: DKA (diabetic ketoacidosis) [facility]) [[id number]]  Admitting Physician: DAHAL, BINAYA [[id number]]  Attending Physician: DAHAL, BINAYA [[id number]]  Certification:: I certify this patient will need inpatient services for at least 2 midnights  Expected Medical Readiness: 10/12/2023          B Medical/Surgery History Past Medical History:  Diagnosis Date   Anemia    Arthritis    Atherosclerosis of aorta (HCC)    Depression    Diabetes mellitus    Hyperlipidemia    Thyroid  nodule    Past Surgical History:   Procedure Laterality Date   LAPAROSCOPIC GASTRIC BANDING  06/14/08   THYROIDECTOMY N/A 10/08/2014   Procedure: NEAR TOTAL THYROIDECTOMY;  Surgeon: Azucena Bollard, MD;  Location: [facility];  Service: General;  Laterality: N/A;   UTERINE FIBROID EMBOLIZATION  10 yrs ago     A IV Location/Drains/Wounds Patient Lines/Drains/Airways Status     Active Line/Drains/Airways     Name Placement date Placement time Site Days   Peripheral IV 10/08/23 22 G 1" Left Antecubital 10/08/23  2100  Antecubital  1            Intake/Output Last 24 hours No intake or output data in the 24 hours ending 10/09/23 1708  Labs/Imaging Results for orders placed or performed during the hospital encounter of 10/08/23 (from the past 48 hours)  Basic metabolic panel     Status: Abnormal   Collection Time: 10/08/23 11:28 AM  Result Value Ref Range   Sodium 136 135 - 145 mmol/L   Potassium 4.0 3.5 - 5.1 mmol/L   Chloride 101 98 - 111 mmol/L   CO2 14 (L) 22 - 32 mmol/L   Glucose, Bld 186 (H) 70 - 99 mg/dL    Comment: Glucose reference range applies only to samples taken after fasting for at least 8 hours.   BUN 13 8 - 23 mg/dL   Creatinine, Ser 4.69 0.44 - 1.00 mg/dL   Calcium  9.2 8.9 - 10.3 mg/dL   GFR, Estimated >62 >95 mL/min    Comment: (NOTE) Calculated using the CKD-EPI Creatinine Equation (2021)    Anion gap  21 (H) 5 - 15    Comment: ELECTROLYTES REPEATED TO VERIFY Performed at [facility], [address] [place], [place] [address]   CBC     Status: Abnormal   Collection Time: 10/08/23 11:28 AM  Result Value Ref Range   WBC 8.2 4.0 - 10.5 K/uL   RBC 6.82 (H) 3.87 - 5.11 MIL/uL   Hemoglobin 13.6 12.0 - 15.0 g/dL   HCT 60.4 (H) 54.0 - 98.1 %   MCV 70.8 (L) 80.0 - 100.0 fL   MCH 19.9 (L) 26.0 - 34.0 pg   MCHC 28.2 (L) 30.0 - 36.0 g/dL   RDW 19.1 (H) 47.8 - 29.5 %   Platelets 327 150 - 400 K/uL    Comment: REPEATED TO VERIFY   nRBC 0.0 0.0 - 0.2 %    Comment: Performed at [facility], [address], [place], [place] [address]  Troponin I (High Sensitivity)     Status: None   Collection Time: 10/08/23 11:28 AM  Result Value Ref Range   Troponin I (High Sensitivity) 5 <18 ng/L    Comment: (NOTE) Elevated high sensitivity troponin I (hsTnI) values and significant  changes across serial measurements may suggest ACS but many other  chronic and acute conditions are known to elevate hsTnI results.  Refer to the "Links" section for chest pain algorithms and additional  guidance. Performed at [facility], [address], [place], [place] [address]   POC CBG, ED     Status: Abnormal   Collection Time: 10/08/23 11:35 AM  Result Value Ref Range   Glucose-Capillary 221 (H) 70 - 99 mg/dL    Comment: Glucose reference range applies only to samples taken after fasting for at least 8 hours.  Urinalysis, Routine w reflex microscopic -Urine, Clean Catch     Status: Abnormal   Collection Time: 10/08/23 11:41 AM  Result Value Ref Range   Color, Urine YELLOW YELLOW   APPearance HAZY (A) CLEAR   Specific Gravity, Urine 1.028 1.005 - 1.030   pH 5.0 5.0 - 8.0   Glucose, UA >=500 (A) NEGATIVE mg/dL   Hgb urine dipstick NEGATIVE NEGATIVE   Bilirubin Urine NEGATIVE NEGATIVE   Ketones, ur 80 (A) NEGATIVE mg/dL   Protein, ur 30 (A) NEGATIVE mg/dL   Nitrite NEGATIVE NEGATIVE   Leukocytes,Ua MODERATE (A) NEGATIVE   RBC / HPF 0-5 0 - 5 RBC/hpf   WBC, UA 6-10 0 - 5 WBC/hpf   Bacteria, UA NONE SEEN NONE SEEN   Squamous Epithelial / HPF 0-5 0 - 5 /HPF   Mucus PRESENT     Comment: Performed at [facility], [address] [place], [place] [address]  Blood culture (routine x 2)     Status: None (Preliminary result)   Collection Time: 10/08/23 11:43 AM   Specimen: BLOOD  Result Value Ref Range   Specimen Description BLOOD LEFT ANTECUBITAL    Special Requests      BOTTLES DRAWN AEROBIC AND ANAEROBIC Blood Culture adequate volume   Culture      NO GROWTH < 24  HOURS Performed at [facility], [address] [place], [place] [address]    Report Status PENDING   Blood culture (routine x 2)     Status: None (Preliminary result)   Collection Time: 10/08/23 11:48 AM   Specimen: BLOOD  Result Value Ref Range   Specimen Description BLOOD RIGHT ANTECUBITAL    Special Requests  BOTTLES DRAWN AEROBIC AND ANAEROBIC Blood Culture adequate volume   Culture      NO GROWTH < 24 HOURS Performed at [facility], [address], [place], [place] [address]    Report Status PENDING   Brain natriuretic peptide     Status: None   Collection Time: 10/08/23 12:00 PM  Result Value Ref Range   B Natriuretic Peptide 20.0 0.0 - 100.0 pg/mL    Comment: Performed at [facility], [address] [place], [place] [address]  I-Stat CG4 Lactic Acid     Status: Abnormal   Collection Time: 10/08/23 12:32 PM  Result Value Ref Range   Lactic Acid, Venous 2.2 (HH) 0.5 - 1.9 mmol/L   Comment NOTIFIED PHYSICIAN   Resp panel by RT-PCR (RSV, Flu A&B, Covid) Peripheral     Status: None   Collection Time: 10/08/23  1:43 PM   Specimen: Peripheral; Nasal Swab  Result Value Ref Range   SARS Coronavirus 2 by RT PCR NEGATIVE NEGATIVE   Influenza A by PCR NEGATIVE NEGATIVE   Influenza B by PCR NEGATIVE NEGATIVE    Comment: (NOTE) The Xpert Xpress SARS-CoV-2/FLU/RSV plus assay is intended as an aid in the diagnosis of influenza from Nasopharyngeal swab specimens and should not be used as a sole basis for treatment. Nasal washings and aspirates are unacceptable for Xpert Xpress SARS-CoV-2/FLU/RSV testing.  Fact Sheet for Patients: BloggerCourse.com  Fact Sheet for Healthcare Providers: SeriousBroker.it  This test is not yet approved or cleared by the [place]  FDA and has been authorized for detection and/or diagnosis of SARS-CoV-2 by FDA under an Emergency Use Authorization (EUA). This EUA will  remain in effect (meaning this test can be used) for the duration of the COVID-19 declaration under Section 564(b)(1) of the Act, 21 U.S.C. section 360bbb-3(b)(1), unless the authorization is terminated or revoked.     Resp Syncytial Virus by PCR NEGATIVE NEGATIVE    Comment: (NOTE) Fact Sheet for Patients: BloggerCourse.com  Fact Sheet for Healthcare Providers: SeriousBroker.it  This test is not yet approved or cleared by the [place]  FDA and has been authorized for detection and/or diagnosis of SARS-CoV-2 by FDA under an Emergency Use Authorization (EUA). This EUA will remain in effect (meaning this test can be used) for the duration of the COVID-19 declaration under Section 564(b)(1) of the Act, 21 U.S.C. section 360bbb-3(b)(1), unless the authorization is terminated or revoked.  Performed at [facility] Lab, [address] [place], [place] [address]   Troponin I (High Sensitivity)     Status: None   Collection Time: 10/09/23  3:52 AM  Result Value Ref Range   Troponin I (High Sensitivity) 6 <18 ng/L    Comment: (NOTE) Elevated high sensitivity troponin I (hsTnI) values and significant  changes across serial measurements may suggest ACS but many other  chronic and acute conditions are known to elevate hsTnI results.  Refer to the "Links" section for chest pain algorithms and additional  guidance. Performed at [facility], [address] [place], [place] [address]   Beta-hydroxybutyric acid     Status: Abnormal   Collection Time: 10/09/23  8:49 AM  Result Value Ref Range   Beta-Hydroxybutyric Acid >8.00 (H) 0.05 - 0.27 mmol/L    Comment: RESULT CONFIRMED BY MANUAL DILUTION Performed at [facility], [address] [place], [place] [address]   I-Stat venous blood gas, [facility] ED, [facility], [facility])     Status: Abnormal  Collection Time: 10/09/23  8:58 AM  Result Value Ref Range   pH, Ven 7.220 (L) 7.25 -  7.43   pCO2, Ven 23.5 (L) 44 - 60 mmHg   pO2, Ven 53 (H) 32 - 45 mmHg   Bicarbonate 9.6 (L) 20.0 - 28.0 mmol/L   TCO2 10 (L) 22 - 32 mmol/L   O2 Saturation 81 %   Acid-base deficit 16.0 (H) 0.0 - 2.0 mmol/L   Sodium 134 (L) 135 - 145 mmol/L   Potassium 3.6 3.5 - 5.1 mmol/L   Calcium , Ion 1.16 1.15 - 1.40 mmol/L   HCT 44.0 36.0 - 46.0 %   Hemoglobin 15.0 12.0 - 15.0 g/dL   Sample type VENOUS   CBG monitoring, ED     Status: Abnormal   Collection Time: 10/09/23 10:53 AM  Result Value Ref Range   Glucose-Capillary 205 (H) 70 - 99 mg/dL    Comment: Glucose reference range applies only to samples taken after fasting for at least 8 hours.  CBG monitoring, ED     Status: Abnormal   Collection Time: 10/09/23 12:10 PM  Result Value Ref Range   Glucose-Capillary 200 (H) 70 - 99 mg/dL    Comment: Glucose reference range applies only to samples taken after fasting for at least 8 hours.  Lipase, blood     Status: None   Collection Time: 10/09/23 12:46 PM  Result Value Ref Range   Lipase 37 11 - 51 U/L    Comment: Performed at [facility], [address] [place], [place] [address]  CBC     Status: Abnormal   Collection Time: 10/09/23 12:46 PM  Result Value Ref Range   WBC 7.2 4.0 - 10.5 K/uL   RBC 5.62 (H) 3.87 - 5.11 MIL/uL   Hemoglobin 11.6 (L) 12.0 - 15.0 g/dL   HCT 60.4 54.0 - 98.1 %   MCV 69.9 (L) 80.0 - 100.0 fL   MCH 20.6 (L) 26.0 - 34.0 pg   MCHC 29.5 (L) 30.0 - 36.0 g/dL   RDW 19.1 (H) 47.8 - 29.5 %   Platelets 264 150 - 400 K/uL    Comment: REPEATED TO VERIFY   nRBC 0.0 0.0 - 0.2 %    Comment: Performed at [facility], [address], [place], [place] [address]  Comprehensive metabolic panel     Status: Abnormal   Collection Time: 10/09/23 12:46 PM  Result Value Ref Range   Sodium 136 135 - 145 mmol/L   Potassium 3.4 (L) 3.5 - 5.1 mmol/L   Chloride 103 98 - 111 mmol/L   CO2 12 (L) 22 - 32 mmol/L   Glucose, Bld 215 (H) 70 - 99 mg/dL    Comment: Glucose  reference range applies only to samples taken after fasting for at least 8 hours.   BUN 13 8 - 23 mg/dL   Creatinine, Ser 8.65 0.44 - 1.00 mg/dL   Calcium  8.5 (L) 8.9 - 10.3 mg/dL   Total Protein 6.4 (L) 6.5 - 8.1 g/dL   Albumin  3.2 (L) 3.5 - 5.0 g/dL   AST 14 (L) 15 - 41 U/L   ALT 17 0 - 44 U/L   Alkaline Phosphatase 71 38 - 126 U/L   Total Bilirubin 1.3 (H) 0.0 - 1.2 mg/dL   GFR, Estimated >78 >46 mL/min    Comment: (NOTE) Calculated using the CKD-EPI Creatinine Equation (2021)    Anion gap 21 (H) 5 - 15    Comment: ELECTROLYTES  REPEATED TO VERIFY Performed at [facility], [address] [place], [place] [address]   Phosphorus     Status: Abnormal   Collection Time: 10/09/23 12:46 PM  Result Value Ref Range   Phosphorus 1.9 (L) 2.5 - 4.6 mg/dL    Comment: Performed at [facility], [address], [place], [place] [address]  Magnesium     Status: None   Collection Time: 10/09/23 12:46 PM  Result Value Ref Range   Magnesium 1.9 1.7 - 2.4 mg/dL    Comment: Performed at [facility], [address] [place], [place] [address]  CBG monitoring, ED     Status: Abnormal   Collection Time: 10/09/23  1:30 PM  Result Value Ref Range   Glucose-Capillary 187 (H) 70 - 99 mg/dL    Comment: Glucose reference range applies only to samples taken after fasting for at least 8 hours.  CBG monitoring, ED     Status: Abnormal   Collection Time: 10/09/23  3:51 PM  Result Value Ref Range   Glucose-Capillary 266 (H) 70 - 99 mg/dL    Comment: Glucose reference range applies only to samples taken after fasting for at least 8 hours.  CBG monitoring, ED     Status: Abnormal   Collection Time: 10/09/23  5:00 PM  Result Value Ref Range   Glucose-Capillary 208 (H) 70 - 99 mg/dL    Comment: Glucose reference range applies only to samples taken after fasting for at least 8 hours.   MR Abdomen W or Wo Contrast Result Date: 10/09/2023 CLINICAL DATA:  Abdominal mass, intra-abdominal  neoplasm suspected;. Abdominal pain. EXAM: MRI ABDOMEN WITHOUT AND WITH CONTRAST TECHNIQUE: Multiplanar multisequence MR imaging of the abdomen was performed both before and after the administration of intravenous contrast. CONTRAST:  6mL GADAVIST  GADOBUTROL  1 MMOL/ML IV SOLN COMPARISON:  CT scan abdomen and pelvis from yesterday. FINDINGS: Lower chest: Unremarkable MR appearance to the lung bases. No pleural effusion. No pericardial effusion. Normal heart size. Hepatobiliary: The liver is normal in size and configuration. There is a 1.5 x 1.9 cm simple cyst in the right hepatic lobe, segment 6. There are additional several subcentimeter sized cysts scattered throughout the liver. No intrahepatic or extrahepatic bile duct dilatation. No choledocholithiasis. Unremarkable gallbladder. Pancreas: There is heterogeneous, mildly T2 hyperintense, moderately enhancing mass lesion completely occupying the pancreatic body/tail. The lesion measures at least 3.5 x 7.9 cm orthogonally on axial plane. The pancreatic head and uncinate process are relatively spared in appear grossly within normal limits. The lesion causes abrupt narrowing of the superior mesenteric vein and portal vein at the level of confluence. However, postcontrast images are markedly motion degraded and finding of nonocclusive thrombus in the portal vein at the confluence can not be confirmed on this exam. Spleen:  Within normal limits in size and appearance. No focal mass. Adrenals/Urinary Tract: Unremarkable adrenal glands. No hydroureteronephrosis. No suspicious renal mass. There is a 3.9 x 4.0 cm simple cyst in the left kidney. There is a sinus cyst in the right kidney lower pole. Stomach/Bowel: Note is made of gastric lap band. Visualized portions within the abdomen are unremarkable. No disproportionate dilation of bowel loops. Vascular/Lymphatic: No pathologically enlarged lymph nodes identified. No abdominal aortic aneurysm demonstrated. No ascites.  Other:  None. Musculoskeletal: There are multiple, T2 hyperintense lesions in the lumbar spine (L2, L4 and L5 vertebrae) and sacrum (marked with electronic arrow sign on series 11). These are favored to represent hemangioma/bone cysts.  Attention on follow-up examination is recommended. IMPRESSION: 1. Heterogeneous moderately enhancing mass centered in the pancreatic body/tail, is highly concerning for neoplastic process. Differential diagnosis also includes mass forming chronic pancreatitis, however, preferred less likely. Correlate clinically along with tumor markers and tissue sampling. 2. There is abrupt narrowing of the superior mesenteric vein/portal vein at the level of confluence, as discussed above. However, postcontrast images are markedly motion degraded and the finding of nonocclusive thrombus in the portal vein can not be confirmed on this exam. 3. No metastatic disease identified within the abdomen. 4. Multiple other nonacute observations, as described above. Electronically Signed   By: Beula Brunswick M.D.   On: 10/09/2023 12:26   MR 3D Recon At Scanner Result Date: 10/09/2023 CLINICAL DATA:  Abdominal mass, intra-abdominal neoplasm suspected;. Abdominal pain. EXAM: MRI ABDOMEN WITHOUT AND WITH CONTRAST TECHNIQUE: Multiplanar multisequence MR imaging of the abdomen was performed both before and after the administration of intravenous contrast. CONTRAST:  6mL GADAVIST  GADOBUTROL  1 MMOL/ML IV SOLN COMPARISON:  CT scan abdomen and pelvis from yesterday. FINDINGS: Lower chest: Unremarkable MR appearance to the lung bases. No pleural effusion. No pericardial effusion. Normal heart size. Hepatobiliary: The liver is normal in size and configuration. There is a 1.5 x 1.9 cm simple cyst in the right hepatic lobe, segment 6. There are additional several subcentimeter sized cysts scattered throughout the liver. No intrahepatic or extrahepatic bile duct dilatation. No choledocholithiasis. Unremarkable  gallbladder. Pancreas: There is heterogeneous, mildly T2 hyperintense, moderately enhancing mass lesion completely occupying the pancreatic body/tail. The lesion measures at least 3.5 x 7.9 cm orthogonally on axial plane. The pancreatic head and uncinate process are relatively spared in appear grossly within normal limits. The lesion causes abrupt narrowing of the superior mesenteric vein and portal vein at the level of confluence. However, postcontrast images are markedly motion degraded and finding of nonocclusive thrombus in the portal vein at the confluence can not be confirmed on this exam. Spleen:  Within normal limits in size and appearance. No focal mass. Adrenals/Urinary Tract: Unremarkable adrenal glands. No hydroureteronephrosis. No suspicious renal mass. There is a 3.9 x 4.0 cm simple cyst in the left kidney. There is a sinus cyst in the right kidney lower pole. Stomach/Bowel: Note is made of gastric lap band. Visualized portions within the abdomen are unremarkable. No disproportionate dilation of bowel loops. Vascular/Lymphatic: No pathologically enlarged lymph nodes identified. No abdominal aortic aneurysm demonstrated. No ascites. Other:  None. Musculoskeletal: There are multiple, T2 hyperintense lesions in the lumbar spine (L2, L4 and L5 vertebrae) and sacrum (marked with electronic arrow sign on series 11). These are favored to represent hemangioma/bone cysts. Attention on follow-up examination is recommended. IMPRESSION: 1. Heterogeneous moderately enhancing mass centered in the pancreatic body/tail, is highly concerning for neoplastic process. Differential diagnosis also includes mass forming chronic pancreatitis, however, preferred less likely. Correlate clinically along with tumor markers and tissue sampling. 2. There is abrupt narrowing of the superior mesenteric vein/portal vein at the level of confluence, as discussed above. However, postcontrast images are markedly motion degraded and the  finding of nonocclusive thrombus in the portal vein can not be confirmed on this exam. 3. No metastatic disease identified within the abdomen. 4. Multiple other nonacute observations, as described above. Electronically Signed   By: Beula Brunswick M.D.   On: 10/09/2023 12:26   CT ABDOMEN PELVIS W CONTRAST Result Date: 10/08/2023 CLINICAL DATA:  Acute abdominal pain.  Weakness. EXAM: CT ABDOMEN AND PELVIS WITH CONTRAST TECHNIQUE: Multidetector CT  imaging of the abdomen and pelvis was performed using the standard protocol following bolus administration of intravenous contrast. RADIATION DOSE REDUCTION: This exam was performed according to the departmental dose-optimization program which includes automated exposure control, adjustment of the mA and/or kV according to patient size and/or use of iterative reconstruction technique. CONTRAST:  75mL OMNIPAQUE  IOHEXOL  350 MG/ML SOLN COMPARISON:  Right upper quadrant ultrasound 08/13/2023 FINDINGS: Lower chest: No acute basilar airspace disease. No pleural effusion. Hepatobiliary: Scattered tiny hepatic hypodensities are too small to characterize. 17 mm hypodense lesion in the posterior right hepatic lobe series 3, image 21. Mild diffuse hepatic steatosis. Possible layering sludge in the gallbladder, no calcified gallstone. Common bile duct is poorly defined, appears to be upper normal at 7 mm. Pancreas: Abnormal appearance of the pancreas with diffuse soft tissue thickening of the pancreatic body, series 3, image 25 and ill-defined low-density in the region of the pancreatic tail, series 3, image 19. Pancreatic tail abnormality spans approximately 5 cm in length. This abnormal density likely encases branches of the celiac artery, in causes mass a effect and possible invasion on the superior mesenteric vein, series 3, image 26. There is no definite peripancreatic inflammation. Spleen: Normal in size without focal abnormality. Adrenals/Urinary Tract: The adrenal glands are  grossly normal, poorly defined. There is no hydronephrosis. No renal calculi. Left renal cyst. No further follow-up imaging is recommended. Partially distended urinary bladder, normal for degree of distension. Stomach/Bowel: Gastric band in place. The catheter of the gastric band is intimately related to the transverse colon, series 3, image 36. This may be within a haustral fold, however it is difficult to exclude erosion of the catheter into the colon. No associated wall thickening. There is no bowel obstruction or inflammation. Small to moderate volume of colonic stool. The appendix is not definitively seen. Vascular/Lymphatic: Aortic atherosclerosis without aneurysm. There is a filling defect in the extrahepatic portal vein at the SMV portal confluence, series 3, image 25, this is contiguous with pancreatic soft tissue density. This also causes mass effect on the SMV series 3, image 28. There is no bulky abdominopelvic adenopathy. Reproductive: Calcified uterine fibroids.  No adnexal mass. Other: No ascites or free air.  No definite omental thickening. Musculoskeletal: No suspicious bone lesion or acute osseous findings. IMPRESSION: 1. Abnormal appearance of the pancreas which is suspicious for pancreatic neoplasm. Recommend further assessment with pancreatic protocol MRI. This causes mass effect on the superior mesenteric vein. Filling defect within the portal vein at the portal splenic confluence is contiguous with the abnormal pancreas and may represent invasion. 2. Hypodense liver lesions are nonspecific and can be assessed on MRI. 3. Gastric band in place. Portions of the band are intimately related to the transverse colon, presumably within a haustral fold, however difficult to exclude catheter invasion into the lumen. Aortic Atherosclerosis (ICD10-I70.0). Electronically Signed   By: Chadwick Colonel M.D.   On: 10/08/2023 21:53   DG Chest 2 View Result Date: 10/08/2023 CLINICAL DATA:  Cough. EXAM: CHEST  - 2 VIEW COMPARISON:  01/31/2018. FINDINGS: Bilateral lung fields are clear. Bilateral costophrenic angles are clear. Normal cardio-mediastinal silhouette. No acute osseous abnormalities. The soft tissues are within normal limits. Gastric lap band is seen. IMPRESSION: No active cardiopulmonary disease. Electronically Signed   By: Beula Brunswick M.D.   On: 10/08/2023 12:09    Pending Labs Unresulted Labs (From admission, onward)     Start     Ordered   10/10/23 1600  Basic metabolic panel  Now then every 4 hours,   R      10/09/23 1254   10/10/23 0500  HIV Antibody (routine testing w rflx)  (HIV Antibody (Routine testing w reflex) panel)  Tomorrow morning,   R        10/09/23 0930   10/10/23 0500  Basic metabolic panel  Tomorrow morning,   R        10/09/23 0930   10/10/23 0500  CBC  Tomorrow morning,   R        10/09/23 0930   10/10/23 0500  Lactic acid, plasma  (Lactic Acid)  Tomorrow morning,   R        10/09/23 1258   10/09/23 1458  Cancer antigen 19-9  Once,   R        10/09/23 1457            Vitals/Pain Today's Vitals   10/09/23 1330 10/09/23 1430 10/09/23 1500 10/09/23 1700  BP: 127/86 134/78 127/75   Pulse:  96  81  Resp: 17 (!) 22 20 14   Temp:      TempSrc:      SpO2:  100%  100%  Weight:      Height:      PainSc:        Isolation Precautions No active isolations  Medications Medications  ondansetron  (ZOFRAN ) injection 4 mg (4 mg Intravenous Patient Refused/Not Given 10/09/23 0923)  guaiFENesin  (MUCINEX ) 12 hr tablet 600 mg (600 mg Oral Given 10/09/23 1107)  insulin  regular, human (MYXREDLIN ) 100 units/ 100 mL infusion (4.4 Units/hr Intravenous Rate/Dose Change 10/09/23 1703)  lactated ringers  infusion ( Intravenous New Bag/Given 10/09/23 1347)  dextrose  5 % in lactated ringers  infusion ( Intravenous New Bag/Given 10/09/23 1102)  dextrose  50 % solution 0-50 mL (has no administration in time range)  acetaminophen  (TYLENOL ) tablet 650 mg (has no administration in  time range)    Or  acetaminophen  (TYLENOL ) suppository 650 mg (has no administration in time range)  polyethylene glycol (MIRALAX  / GLYCOLAX ) packet 17 g (has no administration in time range)  bisacodyl  (DULCOLAX) EC tablet 5 mg (has no administration in time range)  albuterol  (PROVENTIL ) (2.5 MG/3ML) 0.083% nebulizer solution 2.5 mg (has no administration in time range)  hydrALAZINE  (APRESOLINE ) injection 10 mg (has no administration in time range)  enoxaparin  (LOVENOX ) injection 40 mg (40 mg Subcutaneous Given 10/09/23 1107)  HYDROmorphone  (DILAUDID ) injection 1 mg (has no administration in time range)  atorvastatin  (LIPITOR) tablet 40 mg (has no administration in time range)  buPROPion  (WELLBUTRIN  XL) 24 hr tablet 150 mg (150 mg Oral Given 10/09/23 1339)  levothyroxine  (SYNTHROID ) tablet 137 mcg (has no administration in time range)  iohexol  (OMNIPAQUE ) 350 MG/ML injection 75 mL (75 mLs Intravenous Contrast Given 10/08/23 2109)  sodium chloride  0.9 % bolus 1,000 mL (0 mLs Intravenous Stopped 10/09/23 1051)  lactated ringers  bolus 1,280 mL (0 mLs Intravenous Stopped 10/09/23 1304)  gadobutrol  (GADAVIST ) 1 MMOL/ML injection 6 mL (6 mLs Intravenous Contrast Given 10/09/23 0932)  potassium chloride  10 mEq in 100 mL IVPB (0 mEq Intravenous Stopped 10/09/23 1502)    Mobility walks     Focused Assessments Endo tool / renal   R Recommendations: See Admitting Provider Note  Report given to:   Additional Notes: PT is AOX4, walky/talky, next CBG is at 1800, she is lovely, but you can tell she doesn't feel good

## 2023-10-09 NOTE — ED Notes (Signed)
 Pt left the floor in stable condition, AOX4, with her belongings and staff.

## 2023-10-09 NOTE — Consult Note (Signed)
 Reason for Consult:Pancreatic mass-needs an EUS. Referring Physician: THP.  Felicia Garrett is an 68 y.o. female.  HPI: Felicia Garrett is a 68 year old black female with multiple medical problems listed below presented to the emergency room yesterday with fatigue and weakness has been worsening over the last 1 week she also had some left flank pain with urinary frequency but no dysuria. She has been on Ozempic  1 year followed by [facility]  for another year and has lost over 100 pounds over the last couple of years with a 50 pound weight loss in the last few months.  Also complains of generalized fatigue weakness and anorexia.  She had epigastric pain that radiates to her back.  She suffers from chronic constipation and has been morbidly obese most of her life till she started the weight loss drugs. A CT scan of the abdomen pelvis in the emergency room revealed abnormal appearing pancreas suspicious for pancreatic neoplasm; mass effect was noted on the superior mesenteric vein filling defect within the portal vein at the portal splenic confluence contiguous with abnormal pancreatic mass representing possible invasion marked.  Multiple hypodense liver lesions were also noted along with a gastric band that was not placed.  Aortic atherosclerosis was described as well.  A follow-up MRI was done that revealed heterogeneous moderately enhancing mass centered in the pancreatic body and tail, 3.2 x 7.9 cm on the axial plane, highly concerning for neoplastic process. Abrupt narrowing of the superior mesenteric vein/portal vein at the level of the confluence was noted; there is a possibility of a nonocclusive thrombus in the portal vein; a 1.5 x 1.9 cm hepatic cyst was noted in the right lobe along with multiple low-density lesions in the liver.  She had an EGD and a colonoscopy done on 08/14/2023 that was essentially normal.  On labs she was noted to be in diabetic ketoacidosis with elevated lactic acid level of 2.2 and a  bicarb of 14.  She was started on IV insulin  and dextrose .  Past Medical History:  Diagnosis Date   Anemia    Arthritis    Atherosclerosis of aorta (HCC)    Depression    Diabetes mellitus    Hyperlipidemia    Thyroid  nodule    Past Surgical History:  Procedure Laterality Date   LAPAROSCOPIC GASTRIC BANDING  06/14/08   THYROIDECTOMY N/A 10/08/2014   Procedure: NEAR TOTAL THYROIDECTOMY;  Surgeon: Azucena Bollard, MD;  Location: [facility];  Service: General;  Laterality: N/A;   UTERINE FIBROID EMBOLIZATION  10 yrs ago   Family History  Problem Relation Age of Onset   Hypertension Mother    Hyperlipidemia Mother    Hyperthyroidism Mother    Diabetes Father    Thyroid  disease Sister    Hyperlipidemia Brother    Hypertension Brother    Diabetes Brother    Social History:  reports that she has never smoked. She has never used smokeless tobacco. She reports that she does not drink alcohol and does not use drugs.  Allergies:  Allergies  Allergen Reactions   Penicillins Shortness Of Breath    Chest pain   Medications: I have reviewed the patient's current medications. Prior to Admission: (Not in a hospital admission)  Scheduled:  atorvastatin   40 mg Oral QPM   buPROPion   150 mg Oral Daily   enoxaparin  (LOVENOX ) injection  40 mg Subcutaneous Q24H   guaiFENesin   600 mg Oral BID   [START ON 10/10/2023] levothyroxine   137 mcg Oral Q0600   ondansetron   4 mg Intravenous Once   Continuous:  dextrose  5% lactated ringers  125 mL/hr at 10/09/23 1102   insulin  3.8 Units/hr (10/09/23 1332)   lactated ringers  125 mL/hr at 10/09/23 1347   potassium chloride  10 mEq (10/09/23 1348)   PRN:acetaminophen  **OR** acetaminophen , albuterol , bisacodyl , dextrose , hydrALAZINE , HYDROmorphone  (DILAUDID ) injection, polyethylene glycol  Results for orders placed or performed during the hospital encounter of 10/08/23 (from the past 48 hours)  Basic metabolic panel     Status: Abnormal   Collection Time:  10/08/23 11:28 AM  Result Value Ref Range   Sodium 136 135 - 145 mmol/L   Potassium 4.0 3.5 - 5.1 mmol/L   Chloride 101 98 - 111 mmol/L   CO2 14 (L) 22 - 32 mmol/L   Glucose, Bld 186 (H) 70 - 99 mg/dL    Comment: Glucose reference range applies only to samples taken after fasting for at least 8 hours.   BUN 13 8 - 23 mg/dL   Creatinine, Ser 1.61 0.44 - 1.00 mg/dL   Calcium  9.2 8.9 - 10.3 mg/dL   GFR, Estimated >09 >60 mL/min    Comment: (NOTE) Calculated using the CKD-EPI Creatinine Equation (2021)    Anion gap 21 (H) 5 - 15    Comment: ELECTROLYTES REPEATED TO VERIFY Performed at [facility], [address] [place], [place] [address]   CBC     Status: Abnormal   Collection Time: 10/08/23 11:28 AM  Result Value Ref Range   WBC 8.2 4.0 - 10.5 K/uL   RBC 6.82 (H) 3.87 - 5.11 MIL/uL   Hemoglobin 13.6 12.0 - 15.0 g/dL   HCT 81.1 (H) 91.4 - 78.2 %   MCV 70.8 (L) 80.0 - 100.0 fL   MCH 19.9 (L) 26.0 - 34.0 pg   MCHC 28.2 (L) 30.0 - 36.0 g/dL   RDW 95.6 (H) 21.3 - 08.6 %   Platelets 327 150 - 400 K/uL    Comment: REPEATED TO VERIFY   nRBC 0.0 0.0 - 0.2 %    Comment: Performed at [facility], [address] [place], [place] [address]  Troponin I (High Sensitivity)     Status: None   Collection Time: 10/08/23 11:28 AM  Result Value Ref Range   Troponin I (High Sensitivity) 5 <18 ng/L    Comment: (NOTE) Elevated high sensitivity troponin I (hsTnI) values and significant  changes across serial measurements may suggest ACS but many other  chronic and acute conditions are known to elevate hsTnI results.  Refer to the "Links" section for chest pain algorithms and additional  guidance. Performed at [facility], [address] [place], [place] [address]   POC CBG, ED     Status: Abnormal   Collection Time: 10/08/23 11:35 AM  Result Value Ref Range   Glucose-Capillary 221 (H) 70 - 99 mg/dL    Comment: Glucose reference range applies only to samples taken after  fasting for at least 8 hours.  Urinalysis, Routine w reflex microscopic -Urine, Clean Catch     Status: Abnormal   Collection Time: 10/08/23 11:41 AM  Result Value Ref Range   Color, Urine YELLOW YELLOW   APPearance HAZY (A) CLEAR   Specific Gravity, Urine 1.028 1.005 - 1.030   pH 5.0 5.0 - 8.0   Glucose, UA >=500 (A) NEGATIVE mg/dL   Hgb urine dipstick NEGATIVE NEGATIVE   Bilirubin Urine NEGATIVE NEGATIVE   Ketones, ur 80 (A) NEGATIVE mg/dL   Protein, ur 30 (  A) NEGATIVE mg/dL   Nitrite NEGATIVE NEGATIVE   Leukocytes,Ua MODERATE (A) NEGATIVE   RBC / HPF 0-5 0 - 5 RBC/hpf   WBC, UA 6-10 0 - 5 WBC/hpf   Bacteria, UA NONE SEEN NONE SEEN   Squamous Epithelial / HPF 0-5 0 - 5 /HPF   Mucus PRESENT     Comment: Performed at [facility], [address], [place], [place] [address]  Blood culture (routine x 2)     Status: None (Preliminary result)   Collection Time: 10/08/23 11:43 AM   Specimen: BLOOD  Result Value Ref Range   Specimen Description BLOOD LEFT ANTECUBITAL    Special Requests      BOTTLES DRAWN AEROBIC AND ANAEROBIC Blood Culture adequate volume   Culture      NO GROWTH < 24 HOURS Performed at [facility], [address], [place], [place] [address]    Report Status PENDING   Blood culture (routine x 2)     Status: None (Preliminary result)   Collection Time: 10/08/23 11:48 AM   Specimen: BLOOD  Result Value Ref Range   Specimen Description BLOOD RIGHT ANTECUBITAL    Special Requests      BOTTLES DRAWN AEROBIC AND ANAEROBIC Blood Culture adequate volume   Culture      NO GROWTH < 24 HOURS Performed at [facility], [address] [place], [place] [address]    Report Status PENDING   Brain natriuretic peptide     Status: None   Collection Time: 10/08/23 12:00 PM  Result Value Ref Range   B Natriuretic Peptide 20.0 0.0 - 100.0 pg/mL    Comment: Performed at [facility], [address], [place], [place] [address]  I-Stat CG4 Lactic Acid      Status: Abnormal   Collection Time: 10/08/23 12:32 PM  Result Value Ref Range   Lactic Acid, Venous 2.2 (HH) 0.5 - 1.9 mmol/L   Comment NOTIFIED PHYSICIAN   Resp panel by RT-PCR (RSV, Flu A&B, Covid) Peripheral     Status: None   Collection Time: 10/08/23  1:43 PM   Specimen: Peripheral; Nasal Swab  Result Value Ref Range   SARS Coronavirus 2 by RT PCR NEGATIVE NEGATIVE   Influenza A by PCR NEGATIVE NEGATIVE   Influenza B by PCR NEGATIVE NEGATIVE    Comment: (NOTE) The Xpert Xpress SARS-CoV-2/FLU/RSV plus assay is intended as an aid in the diagnosis of influenza from Nasopharyngeal swab specimens and should not be used as a sole basis for treatment. Nasal washings and aspirates are unacceptable for Xpert Xpress SARS-CoV-2/FLU/RSV testing.  Fact Sheet for Patients: BloggerCourse.com  Fact Sheet for Healthcare Providers: SeriousBroker.it  This test is not yet approved or cleared by the [place]  FDA and has been authorized for detection and/or diagnosis of SARS-CoV-2 by FDA under an Emergency Use Authorization (EUA). This EUA will remain in effect (meaning this test can be used) for the duration of the COVID-19 declaration under Section 564(b)(1) of the Act, 21 U.S.C. section 360bbb-3(b)(1), unless the authorization is terminated or revoked.     Resp Syncytial Virus by PCR NEGATIVE NEGATIVE    Comment: (NOTE) Fact Sheet for Patients: BloggerCourse.com  Fact Sheet for Healthcare Providers: SeriousBroker.it  This test is not yet approved or cleared by the [place]  FDA and has been authorized for detection and/or diagnosis of SARS-CoV-2 by FDA under an Emergency Use Authorization (EUA). This EUA will remain in effect (meaning this test  can be used) for the duration of the COVID-19 declaration under Section 564(b)(1) of the Act, 21 U.S.C. section 360bbb-3(b)(1), unless  the authorization is terminated or revoked.  Performed at [facility], [address], [place], [place] [address]   Troponin I (High Sensitivity)     Status: None   Collection Time: 10/09/23  3:52 AM  Result Value Ref Range   Troponin I (High Sensitivity) 6 <18 ng/L    Comment: (NOTE) Elevated high sensitivity troponin I (hsTnI) values and significant  changes across serial measurements may suggest ACS but many other  chronic and acute conditions are known to elevate hsTnI results.  Refer to the "Links" section for chest pain algorithms and additional  guidance. Performed at [facility], [address], [place], [place] [address]   Beta-hydroxybutyric acid     Status: Abnormal   Collection Time: 10/09/23  8:49 AM  Result Value Ref Range   Beta-Hydroxybutyric Acid >8.00 (H) 0.05 - 0.27 mmol/L    Comment: RESULT CONFIRMED BY MANUAL DILUTION Performed at [facility], [address], [place], [place] [address]   I-Stat venous blood gas, [facility], [facility], [facility])     Status: Abnormal   Collection Time: 10/09/23  8:58 AM  Result Value Ref Range   pH, Ven 7.220 (L) 7.25 - 7.43   pCO2, Ven 23.5 (L) 44 - 60 mmHg   pO2, Ven 53 (H) 32 - 45 mmHg   Bicarbonate 9.6 (L) 20.0 - 28.0 mmol/L   TCO2 10 (L) 22 - 32 mmol/L   O2 Saturation 81 %   Acid-base deficit 16.0 (H) 0.0 - 2.0 mmol/L   Sodium 134 (L) 135 - 145 mmol/L   Potassium 3.6 3.5 - 5.1 mmol/L   Calcium , Ion 1.16 1.15 - 1.40 mmol/L   HCT 44.0 36.0 - 46.0 %   Hemoglobin 15.0 12.0 - 15.0 g/dL   Sample type VENOUS   CBG monitoring, ED     Status: Abnormal   Collection Time: 10/09/23 10:53 AM  Result Value Ref Range   Glucose-Capillary 205 (H) 70 - 99 mg/dL    Comment: Glucose reference range applies only to samples taken after fasting for at least 8 hours.  CBG monitoring, ED     Status: Abnormal   Collection Time: 10/09/23 12:10 PM  Result Value Ref Range   Glucose-Capillary 200 (H) 70 - 99 mg/dL    Comment:  Glucose reference range applies only to samples taken after fasting for at least 8 hours.  CBG monitoring, ED     Status: Abnormal   Collection Time: 10/09/23  1:30 PM  Result Value Ref Range   Glucose-Capillary 187 (H) 70 - 99 mg/dL    Comment: Glucose reference range applies only to samples taken after fasting for at least 8 hours.   MR Abdomen W or Wo Contrast Result Date: 10/09/2023 CLINICAL DATA:  Abdominal mass, intra-abdominal neoplasm suspected;. Abdominal pain. EXAM: MRI ABDOMEN WITHOUT AND WITH CONTRAST TECHNIQUE: Multiplanar multisequence MR imaging of the abdomen was performed both before and after the administration of intravenous contrast. CONTRAST:  6mL GADAVIST  GADOBUTROL  1 MMOL/ML IV SOLN COMPARISON:  CT scan abdomen and pelvis from yesterday. FINDINGS: Lower chest: Unremarkable MR appearance to the lung bases. No pleural effusion. No pericardial effusion. Normal heart size. Hepatobiliary: The liver is normal in size and configuration. There is a 1.5 x 1.9 cm simple cyst in the right hepatic lobe, segment 6. There are additional several subcentimeter  sized cysts scattered throughout the liver. No intrahepatic or extrahepatic bile duct dilatation. No choledocholithiasis. Unremarkable gallbladder. Pancreas: There is heterogeneous, mildly T2 hyperintense, moderately enhancing mass lesion completely occupying the pancreatic body/tail. The lesion measures at least 3.5 x 7.9 cm orthogonally on axial plane. The pancreatic head and uncinate process are relatively spared in appear grossly within normal limits. The lesion causes abrupt narrowing of the superior mesenteric vein and portal vein at the level of confluence. However, postcontrast images are markedly motion degraded and finding of nonocclusive thrombus in the portal vein at the confluence can not be confirmed on this exam. Spleen:  Within normal limits in size and appearance. No focal mass. Adrenals/Urinary Tract: Unremarkable adrenal  glands. No hydroureteronephrosis. No suspicious renal mass. There is a 3.9 x 4.0 cm simple cyst in the left kidney. There is a sinus cyst in the right kidney lower pole. Stomach/Bowel: Note is made of gastric lap band. Visualized portions within the abdomen are unremarkable. No disproportionate dilation of bowel loops. Vascular/Lymphatic: No pathologically enlarged lymph nodes identified. No abdominal aortic aneurysm demonstrated. No ascites. Other:  None. Musculoskeletal: There are multiple, T2 hyperintense lesions in the lumbar spine (L2, L4 and L5 vertebrae) and sacrum (marked with electronic arrow sign on series 11). These are favored to represent hemangioma/bone cysts. Attention on follow-up examination is recommended. IMPRESSION: 1. Heterogeneous moderately enhancing mass centered in the pancreatic body/tail, is highly concerning for neoplastic process. Differential diagnosis also includes mass forming chronic pancreatitis, however, preferred less likely. Correlate clinically along with tumor markers and tissue sampling. 2. There is abrupt narrowing of the superior mesenteric vein/portal vein at the level of confluence, as discussed above. However, postcontrast images are markedly motion degraded and the finding of nonocclusive thrombus in the portal vein can not be confirmed on this exam. 3. No metastatic disease identified within the abdomen. 4. Multiple other nonacute observations, as described above. Electronically Signed   By: Beula Brunswick M.D.   On: 10/09/2023 12:26   MR 3D Recon At Scanner Result Date: 10/09/2023 CLINICAL DATA:  Abdominal mass, intra-abdominal neoplasm suspected;. Abdominal pain. EXAM: MRI ABDOMEN WITHOUT AND WITH CONTRAST TECHNIQUE: Multiplanar multisequence MR imaging of the abdomen was performed both before and after the administration of intravenous contrast. CONTRAST:  6mL GADAVIST  GADOBUTROL  1 MMOL/ML IV SOLN COMPARISON:  CT scan abdomen and pelvis from yesterday. FINDINGS:  Lower chest: Unremarkable MR appearance to the lung bases. No pleural effusion. No pericardial effusion. Normal heart size. Hepatobiliary: The liver is normal in size and configuration. There is a 1.5 x 1.9 cm simple cyst in the right hepatic lobe, segment 6. There are additional several subcentimeter sized cysts scattered throughout the liver. No intrahepatic or extrahepatic bile duct dilatation. No choledocholithiasis. Unremarkable gallbladder. Pancreas: There is heterogeneous, mildly T2 hyperintense, moderately enhancing mass lesion completely occupying the pancreatic body/tail. The lesion measures at least 3.5 x 7.9 cm orthogonally on axial plane. The pancreatic head and uncinate process are relatively spared in appear grossly within normal limits. The lesion causes abrupt narrowing of the superior mesenteric vein and portal vein at the level of confluence. However, postcontrast images are markedly motion degraded and finding of nonocclusive thrombus in the portal vein at the confluence can not be confirmed on this exam. Spleen:  Within normal limits in size and appearance. No focal mass. Adrenals/Urinary Tract: Unremarkable adrenal glands. No hydroureteronephrosis. No suspicious renal mass. There is a 3.9 x 4.0 cm simple cyst in the left kidney. There is a sinus  cyst in the right kidney lower pole. Stomach/Bowel: Note is made of gastric lap band. Visualized portions within the abdomen are unremarkable. No disproportionate dilation of bowel loops. Vascular/Lymphatic: No pathologically enlarged lymph nodes identified. No abdominal aortic aneurysm demonstrated. No ascites. Other:  None. Musculoskeletal: There are multiple, T2 hyperintense lesions in the lumbar spine (L2, L4 and L5 vertebrae) and sacrum (marked with electronic arrow sign on series 11). These are favored to represent hemangioma/bone cysts. Attention on follow-up examination is recommended. IMPRESSION: 1. Heterogeneous moderately enhancing mass  centered in the pancreatic body/tail, is highly concerning for neoplastic process. Differential diagnosis also includes mass forming chronic pancreatitis, however, preferred less likely. Correlate clinically along with tumor markers and tissue sampling. 2. There is abrupt narrowing of the superior mesenteric vein/portal vein at the level of confluence, as discussed above. However, postcontrast images are markedly motion degraded and the finding of nonocclusive thrombus in the portal vein can not be confirmed on this exam. 3. No metastatic disease identified within the abdomen. 4. Multiple other nonacute observations, as described above. Electronically Signed   By: Beula Brunswick M.D.   On: 10/09/2023 12:26   CT ABDOMEN PELVIS W CONTRAST Result Date: 10/08/2023 CLINICAL DATA:  Acute abdominal pain.  Weakness. EXAM: CT ABDOMEN AND PELVIS WITH CONTRAST TECHNIQUE: Multidetector CT imaging of the abdomen and pelvis was performed using the standard protocol following bolus administration of intravenous contrast. RADIATION DOSE REDUCTION: This exam was performed according to the departmental dose-optimization program which includes automated exposure control, adjustment of the mA and/or kV according to patient size and/or use of iterative reconstruction technique. CONTRAST:  75mL OMNIPAQUE  IOHEXOL  350 MG/ML SOLN COMPARISON:  Right upper quadrant ultrasound 08/13/2023 FINDINGS: Lower chest: No acute basilar airspace disease. No pleural effusion. Hepatobiliary: Scattered tiny hepatic hypodensities are too small to characterize. 17 mm hypodense lesion in the posterior right hepatic lobe series 3, image 21. Mild diffuse hepatic steatosis. Possible layering sludge in the gallbladder, no calcified gallstone. Common bile duct is poorly defined, appears to be upper normal at 7 mm. Pancreas: Abnormal appearance of the pancreas with diffuse soft tissue thickening of the pancreatic body, series 3, image 25 and ill-defined  low-density in the region of the pancreatic tail, series 3, image 19. Pancreatic tail abnormality spans approximately 5 cm in length. This abnormal density likely encases branches of the celiac artery, in causes mass a effect and possible invasion on the superior mesenteric vein, series 3, image 26. There is no definite peripancreatic inflammation. Spleen: Normal in size without focal abnormality. Adrenals/Urinary Tract: The adrenal glands are grossly normal, poorly defined. There is no hydronephrosis. No renal calculi. Left renal cyst. No further follow-up imaging is recommended. Partially distended urinary bladder, normal for degree of distension. Stomach/Bowel: Gastric band in place. The catheter of the gastric band is intimately related to the transverse colon, series 3, image 36. This may be within a haustral fold, however it is difficult to exclude erosion of the catheter into the colon. No associated wall thickening. There is no bowel obstruction or inflammation. Small to moderate volume of colonic stool. The appendix is not definitively seen. Vascular/Lymphatic: Aortic atherosclerosis without aneurysm. There is a filling defect in the extrahepatic portal vein at the SMV portal confluence, series 3, image 25, this is contiguous with pancreatic soft tissue density. This also causes mass effect on the SMV series 3, image 28. There is no bulky abdominopelvic adenopathy. Reproductive: Calcified uterine fibroids.  No adnexal mass. Other: No ascites or free  air.  No definite omental thickening. Musculoskeletal: No suspicious bone lesion or acute osseous findings. IMPRESSION: 1. Abnormal appearance of the pancreas which is suspicious for pancreatic neoplasm. Recommend further assessment with pancreatic protocol MRI. This causes mass effect on the superior mesenteric vein. Filling defect within the portal vein at the portal splenic confluence is contiguous with the abnormal pancreas and may represent invasion. 2.  Hypodense liver lesions are nonspecific and can be assessed on MRI. 3. Gastric band in place. Portions of the band are intimately related to the transverse colon, presumably within a haustral fold, however difficult to exclude catheter invasion into the lumen. Aortic Atherosclerosis (ICD10-I70.0). Electronically Signed   By: Chadwick Colonel M.D.   On: 10/08/2023 21:53   DG Chest 2 View Result Date: 10/08/2023 CLINICAL DATA:  Cough. EXAM: CHEST - 2 VIEW COMPARISON:  01/31/2018. FINDINGS: Bilateral lung fields are clear. Bilateral costophrenic angles are clear. Normal cardio-mediastinal silhouette. No acute osseous abnormalities. The soft tissues are within normal limits. Gastric lap band is seen. IMPRESSION: No active cardiopulmonary disease. Electronically Signed   By: Beula Brunswick M.D.   On: 10/08/2023 12:09    Review of Systems  Constitutional:  Positive for activity change, appetite change, fatigue and unexpected weight change. Negative for chills, diaphoresis and fever.  HENT: Negative.    Eyes: Negative.   Respiratory: Negative.    Cardiovascular: Negative.   Gastrointestinal:  Positive for abdominal pain, constipation and nausea. Negative for anal bleeding, blood in stool, rectal pain and vomiting.  Endocrine: Negative.   Genitourinary:  Positive for flank pain and frequency. Negative for decreased urine volume, difficulty urinating, dyspareunia, dysuria, enuresis, genital sores, hematuria, menstrual problem, pelvic pain, urgency, vaginal bleeding, vaginal discharge and vaginal pain.  Musculoskeletal:  Positive for arthralgias.  Skin: Negative.   Allergic/Immunologic: Negative.   Neurological:  Positive for weakness. Negative for dizziness, tremors, seizures, syncope, facial asymmetry, speech difficulty, light-headedness, numbness and headaches.  Hematological: Negative.   Psychiatric/Behavioral: Negative.     Blood pressure 123/69, pulse (!) 104, temperature 97.6 F (36.4 C),  temperature source Oral, resp. rate 18, height 5\' 9"  (1.753 m), weight 64 kg, SpO2 100%. Physical Exam Constitutional:      General: She is not in acute distress.    Appearance: Normal appearance.  HENT:     Head: Normocephalic and atraumatic.     Mouth/Throat:     Mouth: Mucous membranes are dry.  Eyes:     Extraocular Movements: Extraocular movements intact.     Pupils: Pupils are equal, round, and reactive to light.  Cardiovascular:     Rate and Rhythm: Normal rate and regular rhythm.  Abdominal:     Palpations: Abdomen is soft.     Tenderness: There is abdominal tenderness in the epigastric area. There is guarding.  Musculoskeletal:     Cervical back: Normal range of motion and neck supple.  Skin:    General: Skin is warm and dry.     Capillary Refill: Capillary refill takes 2 to 3 seconds.  Neurological:     General: No focal deficit present.     Mental Status: She is alert and oriented to person, place, and time.  Psychiatric:        Mood and Affect: Mood normal.        Behavior: Behavior normal.        Thought Content: Thought content normal.        Judgment: Judgment normal.   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 ERCP/EUS planned once she is stable with regards to her ketoacidosis. 2) Acute metabolic acidosis with diabetic ketoacidosis on IV fluids and insulin  drip. 3) Uncontrolled Type 2 diabetes mellitus Hb A1c is 9 9 on 10/04/2023. 4) Depression. 5) Hypothyroidism. 6) Hyperlipidemia. Tami Falcon 10/09/2023, 1:33 PM

## 2023-10-09 NOTE — Inpatient Diabetes Management (Signed)
 Inpatient Diabetes Program Recommendations  AACE/ADA: New Consensus Statement on Inpatient Glycemic Control (2015)  Target Ranges:  Prepandial:   less than 140 mg/dL      Peak postprandial:   less than 180 mg/dL (1-2 hours)      Critically ill patients:  140 - 180 mg/dL   Lab Results  Component Value Date   GLUCAP 187 (H) 10/09/2023   HGBA1C 9.9 (H) 10/04/2023    Review of Glycemic Control  Latest Reference Range & Units 10/08/23 11:35 10/09/23 10:53 10/09/23 12:10 10/09/23 13:30  Glucose-Capillary 70 - 99 mg/dL 161 (H) 096 (H) 045 (H) 187 (H)   Diabetes history: DM 2 Outpatient Diabetes medications:  Lantus  6 units daily-NOT TAKING Mounjaro  12.5 mg weekly Xigduo  XR 01-999 mg daily Current orders for Inpatient glycemic control:  IV insulin -DKA orders  Inpatient Diabetes Program Recommendations:    Note acidosis and elevated beta-hydroxybutyrate.  Patient was taking Xigduo  prior to admit which can increase risk of euglycemic DKA?  Will follow.   Thanks,   Josefa Ni, RN, BC-ADM Inpatient Diabetes Coordinator Pager [phone]  (8a-5p)

## 2023-10-09 NOTE — ED Notes (Signed)
 Pt in MRI at this time

## 2023-10-10 ENCOUNTER — Other Ambulatory Visit (HOSPITAL_COMMUNITY): Payer: Self-pay

## 2023-10-10 ENCOUNTER — Telehealth: Payer: Medicare PPO | Admitting: Internal Medicine

## 2023-10-10 DIAGNOSIS — R531 Weakness: Secondary | ICD-10-CM | POA: Diagnosis not present

## 2023-10-10 DIAGNOSIS — E872 Acidosis, unspecified: Secondary | ICD-10-CM

## 2023-10-10 DIAGNOSIS — K8689 Other specified diseases of pancreas: Secondary | ICD-10-CM | POA: Diagnosis not present

## 2023-10-10 DIAGNOSIS — K869 Disease of pancreas, unspecified: Secondary | ICD-10-CM | POA: Diagnosis present

## 2023-10-10 LAB — GLUCOSE, CAPILLARY
Glucose-Capillary: 144 mg/dL — ABNORMAL HIGH (ref 70–99)
Glucose-Capillary: 154 mg/dL — ABNORMAL HIGH (ref 70–99)
Glucose-Capillary: 159 mg/dL — ABNORMAL HIGH (ref 70–99)
Glucose-Capillary: 168 mg/dL — ABNORMAL HIGH (ref 70–99)
Glucose-Capillary: 174 mg/dL — ABNORMAL HIGH (ref 70–99)
Glucose-Capillary: 213 mg/dL — ABNORMAL HIGH (ref 70–99)
Glucose-Capillary: 244 mg/dL — ABNORMAL HIGH (ref 70–99)
Glucose-Capillary: 313 mg/dL — ABNORMAL HIGH (ref 70–99)
Glucose-Capillary: 335 mg/dL — ABNORMAL HIGH (ref 70–99)

## 2023-10-10 LAB — BASIC METABOLIC PANEL
Anion gap: 8 (ref 5–15)
BUN: 8 mg/dL (ref 8–23)
CO2: 22 mmol/L (ref 22–32)
Calcium: 8.6 mg/dL — ABNORMAL LOW (ref 8.9–10.3)
Chloride: 106 mmol/L (ref 98–111)
Creatinine, Ser: 0.66 mg/dL (ref 0.44–1.00)
GFR, Estimated: >60 mL/min (ref >60)
Glucose, Bld: 161 mg/dL — ABNORMAL HIGH (ref 70–99)
Potassium: 2.8 mmol/L — ABNORMAL LOW (ref 3.5–5.1)
Sodium: 136 mmol/L (ref 135–145)

## 2023-10-10 LAB — HEPATIC FUNCTION PANEL
ALT: 15 U/L (ref 0–44)
AST: 14 U/L — ABNORMAL LOW (ref 15–41)
Albumin: 2.7 g/dL — ABNORMAL LOW (ref 3.5–5.0)
Alkaline Phosphatase: 59 U/L (ref 38–126)
Bilirubin, Direct: 0.2 mg/dL (ref 0.0–0.2)
Indirect Bilirubin: 0.7 mg/dL (ref 0.3–0.9)
Total Bilirubin: 0.9 mg/dL (ref 0.0–1.2)
Total Protein: 5.6 g/dL — ABNORMAL LOW (ref 6.5–8.1)

## 2023-10-10 LAB — LACTIC ACID, PLASMA: Lactic Acid, Venous: 1.6 mmol/L (ref 0.5–1.9)

## 2023-10-10 LAB — CBC
HCT: 34.7 % — ABNORMAL LOW (ref 36.0–46.0)
Hemoglobin: 10.8 g/dL — ABNORMAL LOW (ref 12.0–15.0)
MCH: 20.4 pg — ABNORMAL LOW (ref 26.0–34.0)
MCHC: 31.1 g/dL (ref 30.0–36.0)
MCV: 65.6 fL — ABNORMAL LOW (ref 80.0–100.0)
Platelets: 238 10*3/uL (ref 150–400)
RBC: 5.29 MIL/uL — ABNORMAL HIGH (ref 3.87–5.11)
RDW: 17.7 % — ABNORMAL HIGH (ref 11.5–15.5)
WBC: 5.3 10*3/uL (ref 4.0–10.5)
nRBC: 0 % (ref 0.0–0.2)

## 2023-10-10 LAB — HIV ANTIBODY (ROUTINE TESTING W REFLEX): HIV Screen 4th Generation wRfx: NONREACTIVE

## 2023-10-10 LAB — BETA-HYDROXYBUTYRIC ACID: Beta-Hydroxybutyric Acid: 0.24 mmol/L (ref 0.05–0.27)

## 2023-10-10 LAB — MAGNESIUM: Magnesium: 1.9 mg/dL (ref 1.7–2.4)

## 2023-10-10 MED ORDER — POTASSIUM CHLORIDE CRYS ER 20 MEQ PO TBCR
40.0000 meq | EXTENDED_RELEASE_TABLET | ORAL | Status: AC
  Administered 2023-10-10 (×2): 40 meq via ORAL
  Filled 2023-10-10 (×2): qty 2

## 2023-10-10 MED ORDER — INSULIN GLARGINE-YFGN 100 UNIT/ML ~~LOC~~ SOLN
5.0000 [IU] | Freq: Every day | SUBCUTANEOUS | Status: DC
Start: 2023-10-10 — End: 2023-10-11
  Administered 2023-10-10: 5 [IU] via SUBCUTANEOUS
  Filled 2023-10-10 (×2): qty 0.05

## 2023-10-10 MED ORDER — SODIUM CHLORIDE 0.9 % IV BOLUS
1000.0000 mL | Freq: Once | INTRAVENOUS | Status: AC
  Administered 2023-10-10: 1000 mL via INTRAVENOUS

## 2023-10-10 MED ORDER — INSULIN ASPART 100 UNIT/ML IJ SOLN
0.0000 [IU] | Freq: Every day | INTRAMUSCULAR | Status: DC
  Administered 2023-10-10: 4 [IU] via SUBCUTANEOUS
  Administered 2023-10-11: 3 [IU] via SUBCUTANEOUS

## 2023-10-10 MED ORDER — POTASSIUM CHLORIDE 10 MEQ/100ML IV SOLN
10.0000 meq | INTRAVENOUS | Status: AC
  Administered 2023-10-10 (×4): 10 meq via INTRAVENOUS
  Filled 2023-10-10 (×4): qty 100

## 2023-10-10 MED ORDER — K PHOS MONO-SOD PHOS DI & MONO 155-852-130 MG PO TABS
500.0000 mg | ORAL_TABLET | Freq: Every day | ORAL | Status: DC
  Administered 2023-10-10: 500 mg via ORAL
  Filled 2023-10-10 (×2): qty 2

## 2023-10-10 MED ORDER — INSULIN GLARGINE-YFGN 100 UNIT/ML ~~LOC~~ SOLN: 5.0000 [IU] | Freq: Every day | SUBCUTANEOUS | Status: DC

## 2023-10-10 MED ORDER — INSULIN ASPART 100 UNIT/ML IJ SOLN
0.0000 [IU] | Freq: Three times a day (TID) | INTRAMUSCULAR | Status: DC
  Administered 2023-10-10: 3 [IU] via SUBCUTANEOUS
  Administered 2023-10-10: 7 [IU] via SUBCUTANEOUS
  Administered 2023-10-10 – 2023-10-11 (×3): 3 [IU] via SUBCUTANEOUS
  Administered 2023-10-12: 7 [IU] via SUBCUTANEOUS
  Administered 2023-10-12: 3 [IU] via SUBCUTANEOUS
  Administered 2023-10-12 – 2023-10-13 (×2): 2 [IU] via SUBCUTANEOUS

## 2023-10-10 NOTE — Inpatient Diabetes Management (Signed)
Inpatient Diabetes Program Recommendations  AACE/ADA: New Consensus Statement on Inpatient Glycemic Control (2015)  Target Ranges:  Prepandial:   less than 140 mg/dL      Peak postprandial:   less than 180 mg/dL (1-2 hours)      Critically ill patients:  140 - 180 mg/dL   Lab Results  Component Value Date   GLUCAP 244 (H) 10/10/2023   HGBA1C 9.9 (H) 10/04/2023    Review of Glycemic Control  Latest Reference Range & Units 10/10/23 01:28 10/10/23 01:55 10/10/23 03:06 10/10/23 05:15 10/10/23 08:15 10/10/23 11:51  Glucose-Capillary 70 - 99 mg/dL 161 (H) 096 (H) 045 (H) 144 (H) 213 (H) 244 (H)  Diabetes history: DM 2 Outpatient Diabetes medications:  Lantus 6 units daily-NOT TAKING Mounjaro 12.5 mg weekly Xigduo XR 01-999 mg daily Current orders for Inpatient glycemic control:  Novolog 0-9 units tid with meals and HS Semglee 5 units daily Inpatient Diabetes Program Recommendations:    Spoke with patient regarding elevated A1C of 9.9%- she reports that this is highest it has ever been.   Discussed potential need for insulin at discharge and close monitoring.  She is interested in CGM if she needs to go home on insulin.  Review of history noted.  Patient states "I wonder if this is from the lesion on pancreas".  Will follow and begin to teach insulin with her.  Also will request benefits check for both insulins and CGM.  Patient states that she was ordered a meter last week by PCP.    Thanks,  Lorenza Cambridge, RN, BC-ADM Inpatient Diabetes Coordinator Pager [phone]  (8a-5p)

## 2023-10-10 NOTE — Evaluation (Signed)
Physical Therapy Evaluation Patient Details Name: Felicia Garrett MRN: [id number] DOB: 08-02-1956 Today's Date: 10/10/2023  History of Present Illness  Pt is a 68 y.o. female who presented 10/08/23 with generalized weakness, abdominal pain, and dizziness. Admitted with euglycemic DKA, lactic acidosis, hypokalemia, and pancreatic lesion. PMH includes DM2, HLD, anemia, diabetic ketoacidosis, and postsurgical hypothyroidism.   Clinical Impression  Pt presents with condition above and deficits mentioned below, see PT Problem List. PTA, she was independent without DME, living alone in a 1-level house with 1 STE. Currently, pt is limited by symptomatic orthostatic hypotension, see below. RN and MD aware. She currently demonstrates deficits in gross strength, balance, and activity tolerance, which may be impacted by her orthostatic hypotension. At this time, she is able to ambulate bedroom distances without UE support with CGA for safety due to noted balance deficits that place her at risk for falls. Recommending follow-up with HHPT to ensure her return to her baseline, but pt expressed she did not desire to have any post-acute PT follow-up. Will continue to follow acutely to maximize her return to baseline prior to d/c home.     BP -  93/69 sitting EOB (lightheaded) 113/67 sitting EOB several minutes while performing AROM of legs (symptoms improving) 106/68 sitting after transfer from EOB to commode 89/67 standing (lightheaded) 115/72 sitting after second gait bout 105/76 sitting end of session after final gait bout    If plan is discharge home, recommend the following: Assistance with cooking/housework   Can travel by Nurse, mental health (4 wheels);BSC/3in1  Recommendations for Other Services  OT consult    Functional Status Assessment Patient has had a recent decline in their functional status and demonstrates the ability to make significant improvements  in function in a reasonable and predictable amount of time.     Precautions / Restrictions Precautions Precautions: Fall Precaution Comments: watch BP (orthostatic 1/16) Restrictions Weight Bearing Restrictions Per Provider Order: No      Mobility  Bed Mobility Overal bed mobility: Modified Independent             General bed mobility comments: Pt able to perform supine to sitting EOB mod I with HOB elevated. Upon sitting EOB complained of dizziness.    Transfers Overall transfer level: Needs assistance Equipment used: None Transfers: Sit to/from Stand Sit to Stand: Contact guard assist           General transfer comment: CGA for safety due to reports of dizziness, mild unsteadiness noted but no LOB, slow to rise to stand, x1 rep from EOB, x1 rep from commode, x1 rep from recliner    Ambulation/Gait Ambulation/Gait assistance: Contact guard assist Gait Distance (Feet): 40 Feet (x3 bouts of ~6 ft > ~20 ft >  ~40 ft) Assistive device: None Gait Pattern/deviations: Decreased step length - right, Decreased step length - left, Trunk flexed, Step-through pattern, Drifts right/left, Shuffle Gait velocity: Reduced Gait velocity interpretation: <1.31 ft/sec, indicative of household ambulator   General Gait Details: Pt walked using short shuffling steps secondary to increased fatigue and dizziness. CGA provided for safety secondary to dizziness and lateral sway noted, but no LOB noted.  Stairs            Wheelchair Mobility     Tilt Bed    Modified Rankin (Stroke Patients Only)       Balance  Pertinent Vitals/Pain Pain Assessment Pain Assessment: Faces Faces Pain Scale: Hurts a little bit Pain Location: abdomen Pain Descriptors / Indicators: Discomfort Pain Intervention(s): Limited activity within patient's tolerance, Premedicated before session, Monitored during session, Repositioned     Home Living Family/patient expects to be discharged to:: Private residence Living Arrangements: Alone (has a dog) Available Help at Discharge: Friend(s);Available PRN/intermittently Type of Home: House Home Access: Stairs to enter Entrance Stairs-Rails: None Entrance Stairs-Number of Steps: 1   Home Layout: One level Home Equipment: Toilet riser;Cane - single point;Electric scooter      Prior Function Prior Level of Function : Independent/Modified Independent;Driving             Mobility Comments: No AD ADLs Comments: Has a home cleaner who comes every other week.     Extremity/Trunk Assessment   Upper Extremity Assessment Upper Extremity Assessment: Defer to OT evaluation    Lower Extremity Assessment Lower Extremity Assessment: Generalized weakness (noted with functional mobility)    Cervical / Trunk Assessment Cervical / Trunk Assessment: Normal  Communication   Communication Communication: No apparent difficulties Cueing Techniques: Verbal cues  Cognition Arousal: Alert Behavior During Therapy: WFL for tasks assessed/performed Overall Cognitive Status: Within Functional Limits for tasks assessed                                          General Comments General comments (skin integrity, edema, etc.): BP 93/69 sitting EOB (lightheaded), 113/67 sitting EOB several minutes while performing AROM of legs (symptoms improving), 106/68 sitting after transfer from EOB to commode, 89/67 standing (lightheaded), 115/72 sitting after second gait bout, 105/76 sitting end of session after final gait bout    Exercises General Exercises - Lower Extremity Ankle Circles/Pumps: AROM, Both, 10 reps, Seated Long Arc Quad: AROM, Both, 5 reps, Seated   Assessment/Plan    PT Assessment Patient needs continued PT services  PT Problem List Decreased strength;Decreased activity tolerance;Decreased balance;Decreased mobility;Cardiopulmonary status limiting  activity       PT Treatment Interventions DME instruction;Gait training;Stair training;Functional mobility training;Therapeutic activities;Therapeutic exercise;Balance training;Neuromuscular re-education;Patient/family education    PT Goals (Current goals can be found in the Care Plan section)  Acute Rehab PT Goals Patient Stated Goal: to get better PT Goal Formulation: With patient Time For Goal Achievement: 10/24/23 Potential to Achieve Goals: Good    Frequency Min 1X/week     Co-evaluation               AM-PAC PT "6 Clicks" Mobility  Outcome Measure Help needed turning from your back to your side while in a flat bed without using bedrails?: None Help needed moving from lying on your back to sitting on the side of a flat bed without using bedrails?: None Help needed moving to and from a bed to a chair (including a wheelchair)?: A Little Help needed standing up from a chair using your arms (e.g., wheelchair or bedside chair)?: A Little Help needed to walk in hospital room?: A Little Help needed climbing 3-5 steps with a railing? : A Little 6 Click Score: 20    End of Session Equipment Utilized During Treatment: Gait belt Activity Tolerance: Patient tolerated treatment well;Other (comment) (limited by drop in BP) Patient left: in chair;with call bell/phone within reach;with chair alarm set Nurse Communication: Mobility status;Other (comment) (BP - notified MD) PT Visit Diagnosis: Unsteadiness on feet (R26.81);Other abnormalities of gait and  mobility (R26.89);Muscle weakness (generalized) (M62.81);Difficulty in walking, not elsewhere classified (R26.2);Dizziness and giddiness (R42)    Time: 7846-9629 PT Time Calculation (min) (ACUTE ONLY): 20 min   Charges:   PT Evaluation $PT Eval Moderate Complexity: 1 Mod   PT General Charges $$ ACUTE PT VISIT: 1 Visit         Virgil Benedict, PT, DPT Acute Rehabilitation Services  Office: [phone]   Bettina Gavia 10/10/2023, 6:38 PM

## 2023-10-10 NOTE — Progress Notes (Signed)
[facility] paged regarding no BMET since 10/09/23 @ 12:46. Order for urgent BMET & Beta-hydroxybutyric acid placed & Rathore paged.

## 2023-10-10 NOTE — Progress Notes (Addendum)
Subjective: No new complaints.  Objective: Vital signs in last 24 hours: Temp:  [97.5 F (36.4 C)-98.3 F (36.8 C)] 98 F (36.7 C) (01/16 1153) Pulse Rate:  [76-96] 89 (01/16 1153) Resp:  [14-22] 17 (01/16 1153) BP: (111-134)/(61-86) 111/68 (01/16 0732) SpO2:  [99 %-100 %] 100 % (01/16 0732) Weight:  [61.7 kg] 61.7 kg (01/15 1731) Last BM Date : 10/07/23  Intake/Output from previous day: 01/15 0701 - 01/16 0700 In: 180 [P.O.:180] Out: 200 [Urine:200] Intake/Output this shift: Total I/O In: -  Out: 400 [Urine:400]  General appearance: alert and no distress GI: epigastric discomfort  Lab Results: Recent Labs    10/08/23 1128 10/09/23 0858 10/09/23 1246 10/10/23 0216  WBC 8.2  --  7.2 5.3  HGB 13.6 15.0 11.6* 10.8*  HCT 48.3* 44.0 39.3 34.7*  PLT 327  --  264 238   BMET Recent Labs    10/08/23 1128 10/09/23 0858 10/09/23 1246 10/10/23 0216  NA 136 134* 136 136  K 4.0 3.6 3.4* 2.8*  CL 101  --  103 106  CO2 14*  --  12* 22  GLUCOSE 186*  --  215* 161*  BUN 13  --  13 8  CREATININE 0.98  --  0.95 0.66  CALCIUM 9.2  --  8.5* 8.6*   LFT Recent Labs    10/10/23 0759  PROT 5.6*  ALBUMIN 2.7*  AST 14*  ALT 15  ALKPHOS 59  BILITOT 0.9  BILIDIR 0.2  IBILI 0.7   PT/INR No results for input(s): "LABPROT", "INR" in the last 72 hours. Hepatitis Panel No results for input(s): "HEPBSAG", "HCVAB", "HEPAIGM", "HEPBIGM" in the last 72 hours. C-Diff No results for input(s): "CDIFFTOX" in the last 72 hours. Fecal Lactopherrin No results for input(s): "FECLLACTOFRN" in the last 72 hours.  Studies/Results: MR Abdomen W or Wo Contrast Result Date: 10/09/2023 CLINICAL DATA:  Abdominal mass, intra-abdominal neoplasm suspected;. Abdominal pain. EXAM: MRI ABDOMEN WITHOUT AND WITH CONTRAST TECHNIQUE: Multiplanar multisequence MR imaging of the abdomen was performed both before and after the administration of intravenous contrast. CONTRAST:  6mL GADAVIST GADOBUTROL 1  MMOL/ML IV SOLN COMPARISON:  CT scan abdomen and pelvis from yesterday. FINDINGS: Lower chest: Unremarkable MR appearance to the lung bases. No pleural effusion. No pericardial effusion. Normal heart size. Hepatobiliary: The liver is normal in size and configuration. There is a 1.5 x 1.9 cm simple cyst in the right hepatic lobe, segment 6. There are additional several subcentimeter sized cysts scattered throughout the liver. No intrahepatic or extrahepatic bile duct dilatation. No choledocholithiasis. Unremarkable gallbladder. Pancreas: There is heterogeneous, mildly T2 hyperintense, moderately enhancing mass lesion completely occupying the pancreatic body/tail. The lesion measures at least 3.5 x 7.9 cm orthogonally on axial plane. The pancreatic head and uncinate process are relatively spared in appear grossly within normal limits. The lesion causes abrupt narrowing of the superior mesenteric vein and portal vein at the level of confluence. However, postcontrast images are markedly motion degraded and finding of nonocclusive thrombus in the portal vein at the confluence can not be confirmed on this exam. Spleen:  Within normal limits in size and appearance. No focal mass. Adrenals/Urinary Tract: Unremarkable adrenal glands. No hydroureteronephrosis. No suspicious renal mass. There is a 3.9 x 4.0 cm simple cyst in the left kidney. There is a sinus cyst in the right kidney lower pole. Stomach/Bowel: Note is made of gastric lap band. Visualized portions within the abdomen are unremarkable. No disproportionate dilation of bowel loops. Vascular/Lymphatic: No  pathologically enlarged lymph nodes identified. No abdominal aortic aneurysm demonstrated. No ascites. Other:  None. Musculoskeletal: There are multiple, T2 hyperintense lesions in the lumbar spine (L2, L4 and L5 vertebrae) and sacrum (marked with electronic arrow sign on series 11). These are favored to represent hemangioma/bone cysts. Attention on follow-up  examination is recommended. IMPRESSION: 1. Heterogeneous moderately enhancing mass centered in the pancreatic body/tail, is highly concerning for neoplastic process. Differential diagnosis also includes mass forming chronic pancreatitis, however, preferred less likely. Correlate clinically along with tumor markers and tissue sampling. 2. There is abrupt narrowing of the superior mesenteric vein/portal vein at the level of confluence, as discussed above. However, postcontrast images are markedly motion degraded and the finding of nonocclusive thrombus in the portal vein can not be confirmed on this exam. 3. No metastatic disease identified within the abdomen. 4. Multiple other nonacute observations, as described above. Electronically Signed   By: Jules Schick M.D.   On: 10/09/2023 12:26   MR 3D Recon At Scanner Result Date: 10/09/2023 CLINICAL DATA:  Abdominal mass, intra-abdominal neoplasm suspected;. Abdominal pain. EXAM: MRI ABDOMEN WITHOUT AND WITH CONTRAST TECHNIQUE: Multiplanar multisequence MR imaging of the abdomen was performed both before and after the administration of intravenous contrast. CONTRAST:  6mL GADAVIST GADOBUTROL 1 MMOL/ML IV SOLN COMPARISON:  CT scan abdomen and pelvis from yesterday. FINDINGS: Lower chest: Unremarkable MR appearance to the lung bases. No pleural effusion. No pericardial effusion. Normal heart size. Hepatobiliary: The liver is normal in size and configuration. There is a 1.5 x 1.9 cm simple cyst in the right hepatic lobe, segment 6. There are additional several subcentimeter sized cysts scattered throughout the liver. No intrahepatic or extrahepatic bile duct dilatation. No choledocholithiasis. Unremarkable gallbladder. Pancreas: There is heterogeneous, mildly T2 hyperintense, moderately enhancing mass lesion completely occupying the pancreatic body/tail. The lesion measures at least 3.5 x 7.9 cm orthogonally on axial plane. The pancreatic head and uncinate process are  relatively spared in appear grossly within normal limits. The lesion causes abrupt narrowing of the superior mesenteric vein and portal vein at the level of confluence. However, postcontrast images are markedly motion degraded and finding of nonocclusive thrombus in the portal vein at the confluence can not be confirmed on this exam. Spleen:  Within normal limits in size and appearance. No focal mass. Adrenals/Urinary Tract: Unremarkable adrenal glands. No hydroureteronephrosis. No suspicious renal mass. There is a 3.9 x 4.0 cm simple cyst in the left kidney. There is a sinus cyst in the right kidney lower pole. Stomach/Bowel: Note is made of gastric lap band. Visualized portions within the abdomen are unremarkable. No disproportionate dilation of bowel loops. Vascular/Lymphatic: No pathologically enlarged lymph nodes identified. No abdominal aortic aneurysm demonstrated. No ascites. Other:  None. Musculoskeletal: There are multiple, T2 hyperintense lesions in the lumbar spine (L2, L4 and L5 vertebrae) and sacrum (marked with electronic arrow sign on series 11). These are favored to represent hemangioma/bone cysts. Attention on follow-up examination is recommended. IMPRESSION: 1. Heterogeneous moderately enhancing mass centered in the pancreatic body/tail, is highly concerning for neoplastic process. Differential diagnosis also includes mass forming chronic pancreatitis, however, preferred less likely. Correlate clinically along with tumor markers and tissue sampling. 2. There is abrupt narrowing of the superior mesenteric vein/portal vein at the level of confluence, as discussed above. However, postcontrast images are markedly motion degraded and the finding of nonocclusive thrombus in the portal vein can not be confirmed on this exam. 3. No metastatic disease identified within the abdomen. 4. Multiple  other nonacute observations, as described above. Electronically Signed   By: Jules Schick M.D.   On: 10/09/2023  12:26   CT ABDOMEN PELVIS W CONTRAST Result Date: 10/08/2023 CLINICAL DATA:  Acute abdominal pain.  Weakness. EXAM: CT ABDOMEN AND PELVIS WITH CONTRAST TECHNIQUE: Multidetector CT imaging of the abdomen and pelvis was performed using the standard protocol following bolus administration of intravenous contrast. RADIATION DOSE REDUCTION: This exam was performed according to the departmental dose-optimization program which includes automated exposure control, adjustment of the mA and/or kV according to patient size and/or use of iterative reconstruction technique. CONTRAST:  75mL OMNIPAQUE IOHEXOL 350 MG/ML SOLN COMPARISON:  Right upper quadrant ultrasound 08/13/2023 FINDINGS: Lower chest: No acute basilar airspace disease. No pleural effusion. Hepatobiliary: Scattered tiny hepatic hypodensities are too small to characterize. 17 mm hypodense lesion in the posterior right hepatic lobe series 3, image 21. Mild diffuse hepatic steatosis. Possible layering sludge in the gallbladder, no calcified gallstone. Common bile duct is poorly defined, appears to be upper normal at 7 mm. Pancreas: Abnormal appearance of the pancreas with diffuse soft tissue thickening of the pancreatic body, series 3, image 25 and ill-defined low-density in the region of the pancreatic tail, series 3, image 19. Pancreatic tail abnormality spans approximately 5 cm in length. This abnormal density likely encases branches of the celiac artery, in causes mass a effect and possible invasion on the superior mesenteric vein, series 3, image 26. There is no definite peripancreatic inflammation. Spleen: Normal in size without focal abnormality. Adrenals/Urinary Tract: The adrenal glands are grossly normal, poorly defined. There is no hydronephrosis. No renal calculi. Left renal cyst. No further follow-up imaging is recommended. Partially distended urinary bladder, normal for degree of distension. Stomach/Bowel: Gastric band in place. The catheter of the  gastric band is intimately related to the transverse colon, series 3, image 36. This may be within a haustral fold, however it is difficult to exclude erosion of the catheter into the colon. No associated wall thickening. There is no bowel obstruction or inflammation. Small to moderate volume of colonic stool. The appendix is not definitively seen. Vascular/Lymphatic: Aortic atherosclerosis without aneurysm. There is a filling defect in the extrahepatic portal vein at the SMV portal confluence, series 3, image 25, this is contiguous with pancreatic soft tissue density. This also causes mass effect on the SMV series 3, image 28. There is no bulky abdominopelvic adenopathy. Reproductive: Calcified uterine fibroids.  No adnexal mass. Other: No ascites or free air.  No definite omental thickening. Musculoskeletal: No suspicious bone lesion or acute osseous findings. IMPRESSION: 1. Abnormal appearance of the pancreas which is suspicious for pancreatic neoplasm. Recommend further assessment with pancreatic protocol MRI. This causes mass effect on the superior mesenteric vein. Filling defect within the portal vein at the portal splenic confluence is contiguous with the abnormal pancreas and may represent invasion. 2. Hypodense liver lesions are nonspecific and can be assessed on MRI. 3. Gastric band in place. Portions of the band are intimately related to the transverse colon, presumably within a haustral fold, however difficult to exclude catheter invasion into the lumen. Aortic Atherosclerosis (ICD10-I70.0). Electronically Signed   By: Narda Rutherford M.D.   On: 10/08/2023 21:53    Medications: Scheduled:  atorvastatin  40 mg Oral QPM   buPROPion  150 mg Oral Daily   enoxaparin (LOVENOX) injection  40 mg Subcutaneous Q24H   feeding supplement  1 Container Oral TID BM   guaiFENesin  600 mg Oral BID   insulin  aspart  0-5 Units Subcutaneous QHS   insulin aspart  0-9 Units Subcutaneous TID WC   insulin  glargine-yfgn  5 Units Subcutaneous Daily   levothyroxine  137 mcg Oral Q0600   ondansetron  4 mg Intravenous Once   phosphorus  500 mg Oral Daily   Continuous:  Assessment/Plan: 1) Pancreatic mass - EUS with FNA tomorrow.  Hold anticoagulation and correct electrolytes.  LOS: 1 day   Jearld Hemp D 10/10/2023, 12:38 PM

## 2023-10-10 NOTE — H&P (View-Only) (Signed)
 Subjective: No new complaints.  Objective: Vital signs in last 24 hours: Temp:  [97.5 F (36.4 C)-98.3 F (36.8 C)] 98 F (36.7 C) (01/16 1153) Pulse Rate:  [76-96] 89 (01/16 1153) Resp:  [14-22] 17 (01/16 1153) BP: (111-134)/(61-86) 111/68 (01/16 0732) SpO2:  [99 %-100 %] 100 % (01/16 0732) Weight:  [61.7 kg] 61.7 kg (01/15 1731) Last BM Date : 10/07/23  Intake/Output from previous day: 01/15 0701 - 01/16 0700 In: 180 [P.O.:180] Out: 200 [Urine:200] Intake/Output this shift: Total I/O In: -  Out: 400 [Urine:400]  General appearance: alert and no distress GI: epigastric discomfort  Lab Results: Recent Labs    10/08/23 1128 10/09/23 0858 10/09/23 1246 10/10/23 0216  WBC 8.2  --  7.2 5.3  HGB 13.6 15.0 11.6* 10.8*  HCT 48.3* 44.0 39.3 34.7*  PLT 327  --  264 238   BMET Recent Labs    10/08/23 1128 10/09/23 0858 10/09/23 1246 10/10/23 0216  NA 136 134* 136 136  K 4.0 3.6 3.4* 2.8*  CL 101  --  103 106  CO2 14*  --  12* 22  GLUCOSE 186*  --  215* 161*  BUN 13  --  13 8  CREATININE 0.98  --  0.95 0.66  CALCIUM 9.2  --  8.5* 8.6*   LFT Recent Labs    10/10/23 0759  PROT 5.6*  ALBUMIN 2.7*  AST 14*  ALT 15  ALKPHOS 59  BILITOT 0.9  BILIDIR 0.2  IBILI 0.7   PT/INR No results for input(s): "LABPROT", "INR" in the last 72 hours. Hepatitis Panel No results for input(s): "HEPBSAG", "HCVAB", "HEPAIGM", "HEPBIGM" in the last 72 hours. C-Diff No results for input(s): "CDIFFTOX" in the last 72 hours. Fecal Lactopherrin No results for input(s): "FECLLACTOFRN" in the last 72 hours.  Studies/Results: MR Abdomen W or Wo Contrast Result Date: 10/09/2023 CLINICAL DATA:  Abdominal mass, intra-abdominal neoplasm suspected;. Abdominal pain. EXAM: MRI ABDOMEN WITHOUT AND WITH CONTRAST TECHNIQUE: Multiplanar multisequence MR imaging of the abdomen was performed both before and after the administration of intravenous contrast. CONTRAST:  6mL GADAVIST GADOBUTROL 1  MMOL/ML IV SOLN COMPARISON:  CT scan abdomen and pelvis from yesterday. FINDINGS: Lower chest: Unremarkable MR appearance to the lung bases. No pleural effusion. No pericardial effusion. Normal heart size. Hepatobiliary: The liver is normal in size and configuration. There is a 1.5 x 1.9 cm simple cyst in the right hepatic lobe, segment 6. There are additional several subcentimeter sized cysts scattered throughout the liver. No intrahepatic or extrahepatic bile duct dilatation. No choledocholithiasis. Unremarkable gallbladder. Pancreas: There is heterogeneous, mildly T2 hyperintense, moderately enhancing mass lesion completely occupying the pancreatic body/tail. The lesion measures at least 3.5 x 7.9 cm orthogonally on axial plane. The pancreatic head and uncinate process are relatively spared in appear grossly within normal limits. The lesion causes abrupt narrowing of the superior mesenteric vein and portal vein at the level of confluence. However, postcontrast images are markedly motion degraded and finding of nonocclusive thrombus in the portal vein at the confluence can not be confirmed on this exam. Spleen:  Within normal limits in size and appearance. No focal mass. Adrenals/Urinary Tract: Unremarkable adrenal glands. No hydroureteronephrosis. No suspicious renal mass. There is a 3.9 x 4.0 cm simple cyst in the left kidney. There is a sinus cyst in the right kidney lower pole. Stomach/Bowel: Note is made of gastric lap band. Visualized portions within the abdomen are unremarkable. No disproportionate dilation of bowel loops. Vascular/Lymphatic: No  pathologically enlarged lymph nodes identified. No abdominal aortic aneurysm demonstrated. No ascites. Other:  None. Musculoskeletal: There are multiple, T2 hyperintense lesions in the lumbar spine (L2, L4 and L5 vertebrae) and sacrum (marked with electronic arrow sign on series 11). These are favored to represent hemangioma/bone cysts. Attention on follow-up  examination is recommended. IMPRESSION: 1. Heterogeneous moderately enhancing mass centered in the pancreatic body/tail, is highly concerning for neoplastic process. Differential diagnosis also includes mass forming chronic pancreatitis, however, preferred less likely. Correlate clinically along with tumor markers and tissue sampling. 2. There is abrupt narrowing of the superior mesenteric vein/portal vein at the level of confluence, as discussed above. However, postcontrast images are markedly motion degraded and the finding of nonocclusive thrombus in the portal vein can not be confirmed on this exam. 3. No metastatic disease identified within the abdomen. 4. Multiple other nonacute observations, as described above. Electronically Signed   By: Jules Schick M.D.   On: 10/09/2023 12:26   MR 3D Recon At Scanner Result Date: 10/09/2023 CLINICAL DATA:  Abdominal mass, intra-abdominal neoplasm suspected;. Abdominal pain. EXAM: MRI ABDOMEN WITHOUT AND WITH CONTRAST TECHNIQUE: Multiplanar multisequence MR imaging of the abdomen was performed both before and after the administration of intravenous contrast. CONTRAST:  6mL GADAVIST GADOBUTROL 1 MMOL/ML IV SOLN COMPARISON:  CT scan abdomen and pelvis from yesterday. FINDINGS: Lower chest: Unremarkable MR appearance to the lung bases. No pleural effusion. No pericardial effusion. Normal heart size. Hepatobiliary: The liver is normal in size and configuration. There is a 1.5 x 1.9 cm simple cyst in the right hepatic lobe, segment 6. There are additional several subcentimeter sized cysts scattered throughout the liver. No intrahepatic or extrahepatic bile duct dilatation. No choledocholithiasis. Unremarkable gallbladder. Pancreas: There is heterogeneous, mildly T2 hyperintense, moderately enhancing mass lesion completely occupying the pancreatic body/tail. The lesion measures at least 3.5 x 7.9 cm orthogonally on axial plane. The pancreatic head and uncinate process are  relatively spared in appear grossly within normal limits. The lesion causes abrupt narrowing of the superior mesenteric vein and portal vein at the level of confluence. However, postcontrast images are markedly motion degraded and finding of nonocclusive thrombus in the portal vein at the confluence can not be confirmed on this exam. Spleen:  Within normal limits in size and appearance. No focal mass. Adrenals/Urinary Tract: Unremarkable adrenal glands. No hydroureteronephrosis. No suspicious renal mass. There is a 3.9 x 4.0 cm simple cyst in the left kidney. There is a sinus cyst in the right kidney lower pole. Stomach/Bowel: Note is made of gastric lap band. Visualized portions within the abdomen are unremarkable. No disproportionate dilation of bowel loops. Vascular/Lymphatic: No pathologically enlarged lymph nodes identified. No abdominal aortic aneurysm demonstrated. No ascites. Other:  None. Musculoskeletal: There are multiple, T2 hyperintense lesions in the lumbar spine (L2, L4 and L5 vertebrae) and sacrum (marked with electronic arrow sign on series 11). These are favored to represent hemangioma/bone cysts. Attention on follow-up examination is recommended. IMPRESSION: 1. Heterogeneous moderately enhancing mass centered in the pancreatic body/tail, is highly concerning for neoplastic process. Differential diagnosis also includes mass forming chronic pancreatitis, however, preferred less likely. Correlate clinically along with tumor markers and tissue sampling. 2. There is abrupt narrowing of the superior mesenteric vein/portal vein at the level of confluence, as discussed above. However, postcontrast images are markedly motion degraded and the finding of nonocclusive thrombus in the portal vein can not be confirmed on this exam. 3. No metastatic disease identified within the abdomen. 4. Multiple  other nonacute observations, as described above. Electronically Signed   By: Jules Schick M.D.   On: 10/09/2023  12:26   CT ABDOMEN PELVIS W CONTRAST Result Date: 10/08/2023 CLINICAL DATA:  Acute abdominal pain.  Weakness. EXAM: CT ABDOMEN AND PELVIS WITH CONTRAST TECHNIQUE: Multidetector CT imaging of the abdomen and pelvis was performed using the standard protocol following bolus administration of intravenous contrast. RADIATION DOSE REDUCTION: This exam was performed according to the departmental dose-optimization program which includes automated exposure control, adjustment of the mA and/or kV according to patient size and/or use of iterative reconstruction technique. CONTRAST:  75mL OMNIPAQUE IOHEXOL 350 MG/ML SOLN COMPARISON:  Right upper quadrant ultrasound 08/13/2023 FINDINGS: Lower chest: No acute basilar airspace disease. No pleural effusion. Hepatobiliary: Scattered tiny hepatic hypodensities are too small to characterize. 17 mm hypodense lesion in the posterior right hepatic lobe series 3, image 21. Mild diffuse hepatic steatosis. Possible layering sludge in the gallbladder, no calcified gallstone. Common bile duct is poorly defined, appears to be upper normal at 7 mm. Pancreas: Abnormal appearance of the pancreas with diffuse soft tissue thickening of the pancreatic body, series 3, image 25 and ill-defined low-density in the region of the pancreatic tail, series 3, image 19. Pancreatic tail abnormality spans approximately 5 cm in length. This abnormal density likely encases branches of the celiac artery, in causes mass a effect and possible invasion on the superior mesenteric vein, series 3, image 26. There is no definite peripancreatic inflammation. Spleen: Normal in size without focal abnormality. Adrenals/Urinary Tract: The adrenal glands are grossly normal, poorly defined. There is no hydronephrosis. No renal calculi. Left renal cyst. No further follow-up imaging is recommended. Partially distended urinary bladder, normal for degree of distension. Stomach/Bowel: Gastric band in place. The catheter of the  gastric band is intimately related to the transverse colon, series 3, image 36. This may be within a haustral fold, however it is difficult to exclude erosion of the catheter into the colon. No associated wall thickening. There is no bowel obstruction or inflammation. Small to moderate volume of colonic stool. The appendix is not definitively seen. Vascular/Lymphatic: Aortic atherosclerosis without aneurysm. There is a filling defect in the extrahepatic portal vein at the SMV portal confluence, series 3, image 25, this is contiguous with pancreatic soft tissue density. This also causes mass effect on the SMV series 3, image 28. There is no bulky abdominopelvic adenopathy. Reproductive: Calcified uterine fibroids.  No adnexal mass. Other: No ascites or free air.  No definite omental thickening. Musculoskeletal: No suspicious bone lesion or acute osseous findings. IMPRESSION: 1. Abnormal appearance of the pancreas which is suspicious for pancreatic neoplasm. Recommend further assessment with pancreatic protocol MRI. This causes mass effect on the superior mesenteric vein. Filling defect within the portal vein at the portal splenic confluence is contiguous with the abnormal pancreas and may represent invasion. 2. Hypodense liver lesions are nonspecific and can be assessed on MRI. 3. Gastric band in place. Portions of the band are intimately related to the transverse colon, presumably within a haustral fold, however difficult to exclude catheter invasion into the lumen. Aortic Atherosclerosis (ICD10-I70.0). Electronically Signed   By: Narda Rutherford M.D.   On: 10/08/2023 21:53    Medications: Scheduled:  atorvastatin  40 mg Oral QPM   buPROPion  150 mg Oral Daily   enoxaparin (LOVENOX) injection  40 mg Subcutaneous Q24H   feeding supplement  1 Container Oral TID BM   guaiFENesin  600 mg Oral BID   insulin  aspart  0-5 Units Subcutaneous QHS   insulin aspart  0-9 Units Subcutaneous TID WC   insulin  glargine-yfgn  5 Units Subcutaneous Daily   levothyroxine  137 mcg Oral Q0600   ondansetron  4 mg Intravenous Once   phosphorus  500 mg Oral Daily   Continuous:  Assessment/Plan: 1) Pancreatic mass - EUS with FNA tomorrow.  Hold anticoagulation and correct electrolytes.  LOS: 1 day   Jearld Hemp D 10/10/2023, 12:38 PM

## 2023-10-10 NOTE — Progress Notes (Signed)
Anion gap is now 8. On call [facility] provider, Rathore, MD paged to change insulin orders per endotool. Rathore also made aware of K+ 2.8. Awaiting call back and/or orders

## 2023-10-10 NOTE — Progress Notes (Addendum)
[facility]                                                                              [name], is a 68 y.o. female, DOB - Mar 31, 1956, NFA:[id number] Admit date - 10/08/2023    Outpatient Primary MD for the patient is Felicia Peng, MD  LOS - 1  days  No chief complaint on file.      Brief summary   Patient is a 68 year old female with history of gastric band surgery, DM type II, HLD, palpitations, anemia, depression, has been on Mounjaro for >2 years (started in 07/2021 per Dr Zella Ball office note), had been doing well, lost 100lbs per patient.  Prior to that was briefly on semaglutide before switch to [facility] and dapagliflozin-metformin.  She had noticed symptoms of GI upset in November 2024 while in [place] on a mission trip, underwent EGD and colonoscopy on 08/14/2023 that was negative.   She had noted epigastric pain, poor appetite, worsening generalized weakness in the last 2 weeks, feeling dizzy otherwise no nausea vomiting diarrhea or fevers. On 1/9, had a televisit with her PCP, urgent care visit on 1/12 and presented to ED on 1/14. In ED, tachycardiac with HR 124, glucose 186, serum bicarb low at 14, lactic acid elevated 2.2 During ED visit, VBG showed acidotic pH at 7.2, pCO2 23.5, serum bicarb 9.6, BHB> 8  CT abdomen showed abnormal appearance of the pancreas suspicious for pancreatic neoplasm, possible mass effect of the superior mesenteric vein, hypodense liver lesions, recommended MRCP.  GI consulted, patient admitted for further workup.  Assessment & Plan    Principal Problem: Euglycemic DKA (diabetic ketoacidosis) (HCC), lactic acidosis -In the setting of diabetes mellitus type 2, NIDDM, uncontrolled with hyperglycemia -Likely acute acidosis from starvation ketoacidosis and combination of euglycemic DKA due todapagliflozin-metformin -DKA resolved, patient now transitioned to subcutaneous Semglee 5 units daily, sliding scale insulin,  sensitive -For now continue clear liquid diet, awaiting further GI workup/EUS -Hemoglobin A1c 9.9 on 10/04/2023 CBG (last 3)  Recent Labs    10/10/23 0306 10/10/23 0515 10/10/23 0815  GLUCAP 154* 144* 213*     Active Problems: Hypokalemia -Potassium 2.8, follow magnesium level -Placed on Kdur 40mq x 2 and IV replacement  Pancreatic lesion -MRCP showed heterogeneous moderately enhancing mass in the pancreatic body/tail concerning for neoplastic process, abrupt narrowing of the superior mesenteric vein/portal vein at the level of confluence.  No metastatic disease identified in the abdomen. -GI consulted, Dr Loreta Ave following, plan for EUS and biopsy, continue clear liquid diet   hypothyroidism -Continue Synthroid  Depression -Continue Wellbutrin  Generalized debility -PT OT evaluation  Mild hyponatremia -Likely due to dehydration, poor p.o. intake.  Sodium 134 on admission -Resolved  Hypophosphatemia -Placed on Neutra-Phos x 3days     Essential hypertension, benign -BP currently soft, hold losartan, metoprolol  Hyperlipidemia -Continue Lipitor  Estimated body mass index is 20.08 kg/m as calculated from the following:   Height as of this encounter: 5\' 9"  (1.753 m).   Weight as of this encounter: 61.7 kg.  Code Status: Full CODE STATUS DVT Prophylaxis:  enoxaparin (LOVENOX) injection 40 mg Start: 10/09/23  0945   Level of Care: Level of care: Progressive Family Communication: Updated patient Disposition Plan:      Remains inpatient appropriate:      Procedures:  MRCP  Consultants:   GI  Antimicrobials:   Anti-infectives (From admission, onward)    None          Medications  atorvastatin  40 mg Oral QPM   buPROPion  150 mg Oral Daily   enoxaparin (LOVENOX) injection  40 mg Subcutaneous Q24H   feeding supplement  1 Container Oral TID BM   guaiFENesin  600 mg Oral BID   insulin aspart  0-5 Units Subcutaneous QHS   insulin aspart  0-9 Units  Subcutaneous TID WC   insulin glargine-yfgn  5 Units Subcutaneous Daily   levothyroxine  137 mcg Oral Q0600   ondansetron  4 mg Intravenous Once   potassium chloride  40 mEq Oral Q4H      Subjective:   Felicia Garrett was seen and examined today.  No acute complaints this morning.  No active nausea vomiting, chest pain or shortness of breath.  + Generalized weakness, fatigue.   No acute events overnight.    Objective:   Vitals:   10/09/23 1910 10/09/23 2004 10/10/23 0340 10/10/23 0732  BP: 114/61  116/66 111/68  Pulse: 90  95 89  Resp: 18  17 17   Temp: (!) 97.5 F (36.4 C) 97.8 F (36.6 C) 97.7 F (36.5 C) 98.3 F (36.8 C)  TempSrc: Oral  Oral Oral  SpO2: 100%  99% 100%  Weight:      Height:        Intake/Output Summary (Last 24 hours) at 10/10/2023 1004 Last data filed at 10/10/2023 0253 Gross per 24 hour  Intake 180 ml  Output 200 ml  Net -20 ml     Wt Readings from Last 3 Encounters:  10/09/23 61.7 kg  10/03/23 64 kg  08/21/23 70.5 kg     Exam General: Alert and oriented x 3, NAD Cardiovascular: S1 S2 auscultated,  RRR Respiratory: Clear to auscultation bilaterally, no wheezing, rales or rhonchi Gastrointestinal: Soft, nontender, nondistended, + bowel sounds Ext: no pedal edema bilaterally Neuro: Strength 5/5 upper and lower extremities bilaterally Skin: No rashes Psych: Normal affect     Data Reviewed:  I have personally reviewed following labs    CBC Lab Results  Component Value Date   WBC 5.3 10/10/2023   RBC 5.29 (H) 10/10/2023   HGB 10.8 (L) 10/10/2023   HCT 34.7 (L) 10/10/2023   MCV 65.6 (L) 10/10/2023   MCH 20.4 (L) 10/10/2023   PLT 238 10/10/2023   MCHC 31.1 10/10/2023   RDW 17.7 (H) 10/10/2023   LYMPHSABS 0.7 04/25/2023   MONOABS 0.3 04/25/2023   EOSABS 0.0 04/25/2023   BASOSABS 0.0 04/25/2023     Last metabolic panel Lab Results  Component Value Date   NA 136 10/10/2023   K 2.8 (L) 10/10/2023   CL 106 10/10/2023   CO2 22  10/10/2023   BUN 8 10/10/2023   CREATININE 0.66 10/10/2023   GLUCOSE 161 (H) 10/10/2023   GFRNONAA >60 10/10/2023   GFRAA 85 08/04/2020   CALCIUM 8.6 (L) 10/10/2023   PHOS 1.9 (L) 10/09/2023   PROT 6.4 (L) 10/09/2023   ALBUMIN 3.2 (L) 10/09/2023   LABGLOB 2.5 10/04/2023   AGRATIO 1.8 11/26/2022   BILITOT 1.3 (H) 10/09/2023   ALKPHOS 71 10/09/2023   AST 14 (L) 10/09/2023   ALT 17 10/09/2023   ANIONGAP  8 10/10/2023    CBG (last 3)  Recent Labs    10/10/23 0306 10/10/23 0515 10/10/23 0815  GLUCAP 154* 144* 213*      Coagulation Profile: No results for input(s): "INR", "PROTIME" in the last 168 hours.   Radiology Studies: I have personally reviewed the imaging studies  MR Abdomen W or Wo Contrast Result Date: 10/09/2023 CLINICAL DATA:  Abdominal mass, intra-abdominal neoplasm suspected;. Abdominal pain. EXAM: MRI ABDOMEN WITHOUT AND WITH CONTRAST TECHNIQUE: Multiplanar multisequence MR imaging of the abdomen was performed both before and after the administration of intravenous contrast. CONTRAST:  6mL GADAVIST GADOBUTROL 1 MMOL/ML IV SOLN COMPARISON:  CT scan abdomen and pelvis from yesterday. FINDINGS: Lower chest: Unremarkable MR appearance to the lung bases. No pleural effusion. No pericardial effusion. Normal heart size. Hepatobiliary: The liver is normal in size and configuration. There is a 1.5 x 1.9 cm simple cyst in the right hepatic lobe, segment 6. There are additional several subcentimeter sized cysts scattered throughout the liver. No intrahepatic or extrahepatic bile duct dilatation. No choledocholithiasis. Unremarkable gallbladder. Pancreas: There is heterogeneous, mildly T2 hyperintense, moderately enhancing mass lesion completely occupying the pancreatic body/tail. The lesion measures at least 3.5 x 7.9 cm orthogonally on axial plane. The pancreatic head and uncinate process are relatively spared in appear grossly within normal limits. The lesion causes abrupt narrowing  of the superior mesenteric vein and portal vein at the level of confluence. However, postcontrast images are markedly motion degraded and finding of nonocclusive thrombus in the portal vein at the confluence can not be confirmed on this exam. Spleen:  Within normal limits in size and appearance. No focal mass. Adrenals/Urinary Tract: Unremarkable adrenal glands. No hydroureteronephrosis. No suspicious renal mass. There is a 3.9 x 4.0 cm simple cyst in the left kidney. There is a sinus cyst in the right kidney lower pole. Stomach/Bowel: Note is made of gastric lap band. Visualized portions within the abdomen are unremarkable. No disproportionate dilation of bowel loops. Vascular/Lymphatic: No pathologically enlarged lymph nodes identified. No abdominal aortic aneurysm demonstrated. No ascites. Other:  None. Musculoskeletal: There are multiple, T2 hyperintense lesions in the lumbar spine (L2, L4 and L5 vertebrae) and sacrum (marked with electronic arrow sign on series 11). These are favored to represent hemangioma/bone cysts. Attention on follow-up examination is recommended. IMPRESSION: 1. Heterogeneous moderately enhancing mass centered in the pancreatic body/tail, is highly concerning for neoplastic process. Differential diagnosis also includes mass forming chronic pancreatitis, however, preferred less likely. Correlate clinically along with tumor markers and tissue sampling. 2. There is abrupt narrowing of the superior mesenteric vein/portal vein at the level of confluence, as discussed above. However, postcontrast images are markedly motion degraded and the finding of nonocclusive thrombus in the portal vein can not be confirmed on this exam. 3. No metastatic disease identified within the abdomen. 4. Multiple other nonacute observations, as described above. Electronically Signed   By: Jules Schick M.D.   On: 10/09/2023 12:26   MR 3D Recon At Scanner Result Date: 10/09/2023 CLINICAL DATA:  Abdominal mass,  intra-abdominal neoplasm suspected;. Abdominal pain. EXAM: MRI ABDOMEN WITHOUT AND WITH CONTRAST TECHNIQUE: Multiplanar multisequence MR imaging of the abdomen was performed both before and after the administration of intravenous contrast. CONTRAST:  6mL GADAVIST GADOBUTROL 1 MMOL/ML IV SOLN COMPARISON:  CT scan abdomen and pelvis from yesterday. FINDINGS: Lower chest: Unremarkable MR appearance to the lung bases. No pleural effusion. No pericardial effusion. Normal heart size. Hepatobiliary: The liver is normal in size and  configuration. There is a 1.5 x 1.9 cm simple cyst in the right hepatic lobe, segment 6. There are additional several subcentimeter sized cysts scattered throughout the liver. No intrahepatic or extrahepatic bile duct dilatation. No choledocholithiasis. Unremarkable gallbladder. Pancreas: There is heterogeneous, mildly T2 hyperintense, moderately enhancing mass lesion completely occupying the pancreatic body/tail. The lesion measures at least 3.5 x 7.9 cm orthogonally on axial plane. The pancreatic head and uncinate process are relatively spared in appear grossly within normal limits. The lesion causes abrupt narrowing of the superior mesenteric vein and portal vein at the level of confluence. However, postcontrast images are markedly motion degraded and finding of nonocclusive thrombus in the portal vein at the confluence can not be confirmed on this exam. Spleen:  Within normal limits in size and appearance. No focal mass. Adrenals/Urinary Tract: Unremarkable adrenal glands. No hydroureteronephrosis. No suspicious renal mass. There is a 3.9 x 4.0 cm simple cyst in the left kidney. There is a sinus cyst in the right kidney lower pole. Stomach/Bowel: Note is made of gastric lap band. Visualized portions within the abdomen are unremarkable. No disproportionate dilation of bowel loops. Vascular/Lymphatic: No pathologically enlarged lymph nodes identified. No abdominal aortic aneurysm demonstrated.  No ascites. Other:  None. Musculoskeletal: There are multiple, T2 hyperintense lesions in the lumbar spine (L2, L4 and L5 vertebrae) and sacrum (marked with electronic arrow sign on series 11). These are favored to represent hemangioma/bone cysts. Attention on follow-up examination is recommended. IMPRESSION: 1. Heterogeneous moderately enhancing mass centered in the pancreatic body/tail, is highly concerning for neoplastic process. Differential diagnosis also includes mass forming chronic pancreatitis, however, preferred less likely. Correlate clinically along with tumor markers and tissue sampling. 2. There is abrupt narrowing of the superior mesenteric vein/portal vein at the level of confluence, as discussed above. However, postcontrast images are markedly motion degraded and the finding of nonocclusive thrombus in the portal vein can not be confirmed on this exam. 3. No metastatic disease identified within the abdomen. 4. Multiple other nonacute observations, as described above. Electronically Signed   By: Jules Schick M.D.   On: 10/09/2023 12:26   CT ABDOMEN PELVIS W CONTRAST Result Date: 10/08/2023 CLINICAL DATA:  Acute abdominal pain.  Weakness. EXAM: CT ABDOMEN AND PELVIS WITH CONTRAST TECHNIQUE: Multidetector CT imaging of the abdomen and pelvis was performed using the standard protocol following bolus administration of intravenous contrast. RADIATION DOSE REDUCTION: This exam was performed according to the departmental dose-optimization program which includes automated exposure control, adjustment of the mA and/or kV according to patient size and/or use of iterative reconstruction technique. CONTRAST:  75mL OMNIPAQUE IOHEXOL 350 MG/ML SOLN COMPARISON:  Right upper quadrant ultrasound 08/13/2023 FINDINGS: Lower chest: No acute basilar airspace disease. No pleural effusion. Hepatobiliary: Scattered tiny hepatic hypodensities are too small to characterize. 17 mm hypodense lesion in the posterior right  hepatic lobe series 3, image 21. Mild diffuse hepatic steatosis. Possible layering sludge in the gallbladder, no calcified gallstone. Common bile duct is poorly defined, appears to be upper normal at 7 mm. Pancreas: Abnormal appearance of the pancreas with diffuse soft tissue thickening of the pancreatic body, series 3, image 25 and ill-defined low-density in the region of the pancreatic tail, series 3, image 19. Pancreatic tail abnormality spans approximately 5 cm in length. This abnormal density likely encases branches of the celiac artery, in causes mass a effect and possible invasion on the superior mesenteric vein, series 3, image 26. There is no definite peripancreatic inflammation. Spleen: Normal in size  without focal abnormality. Adrenals/Urinary Tract: The adrenal glands are grossly normal, poorly defined. There is no hydronephrosis. No renal calculi. Left renal cyst. No further follow-up imaging is recommended. Partially distended urinary bladder, normal for degree of distension. Stomach/Bowel: Gastric band in place. The catheter of the gastric band is intimately related to the transverse colon, series 3, image 36. This may be within a haustral fold, however it is difficult to exclude erosion of the catheter into the colon. No associated wall thickening. There is no bowel obstruction or inflammation. Small to moderate volume of colonic stool. The appendix is not definitively seen. Vascular/Lymphatic: Aortic atherosclerosis without aneurysm. There is a filling defect in the extrahepatic portal vein at the SMV portal confluence, series 3, image 25, this is contiguous with pancreatic soft tissue density. This also causes mass effect on the SMV series 3, image 28. There is no bulky abdominopelvic adenopathy. Reproductive: Calcified uterine fibroids.  No adnexal mass. Other: No ascites or free air.  No definite omental thickening. Musculoskeletal: No suspicious bone lesion or acute osseous findings. IMPRESSION:  1. Abnormal appearance of the pancreas which is suspicious for pancreatic neoplasm. Recommend further assessment with pancreatic protocol MRI. This causes mass effect on the superior mesenteric vein. Filling defect within the portal vein at the portal splenic confluence is contiguous with the abnormal pancreas and may represent invasion. 2. Hypodense liver lesions are nonspecific and can be assessed on MRI. 3. Gastric band in place. Portions of the band are intimately related to the transverse colon, presumably within a haustral fold, however difficult to exclude catheter invasion into the lumen. Aortic Atherosclerosis (ICD10-I70.0). Electronically Signed   By: Narda Rutherford M.D.   On: 10/08/2023 21:53   DG Chest 2 View Result Date: 10/08/2023 CLINICAL DATA:  Cough. EXAM: CHEST - 2 VIEW COMPARISON:  01/31/2018. FINDINGS: Bilateral lung fields are clear. Bilateral costophrenic angles are clear. Normal cardio-mediastinal silhouette. No acute osseous abnormalities. The soft tissues are within normal limits. Gastric lap band is seen. IMPRESSION: No active cardiopulmonary disease. Electronically Signed   By: Jules Schick M.D.   On: 10/08/2023 12:09       Dirk Vanaman M.D. [facility] 10/10/2023, 10:04 AM  Available via Epic secure chat 7am-7pm After 7 pm, please refer to night coverage provider listed on amion.

## 2023-10-11 ENCOUNTER — Inpatient Hospital Stay (HOSPITAL_COMMUNITY): Payer: Medicare PPO | Admitting: Certified Registered Nurse Anesthetist

## 2023-10-11 ENCOUNTER — Encounter (HOSPITAL_COMMUNITY)
Admission: EM | Disposition: A | Discharge status: Home / Self Care | Payer: Self-pay | Source: Home / Self Care | Attending: Internal Medicine

## 2023-10-11 ENCOUNTER — Encounter (HOSPITAL_COMMUNITY): Payer: Self-pay | Admitting: Internal Medicine

## 2023-10-11 DIAGNOSIS — E119 Type 2 diabetes mellitus without complications: Secondary | ICD-10-CM

## 2023-10-11 DIAGNOSIS — R630 Anorexia: Secondary | ICD-10-CM

## 2023-10-11 DIAGNOSIS — C252 Malignant neoplasm of tail of pancreas: Secondary | ICD-10-CM | POA: Diagnosis not present

## 2023-10-11 DIAGNOSIS — E43 Unspecified severe protein-calorie malnutrition: Secondary | ICD-10-CM | POA: Diagnosis not present

## 2023-10-11 DIAGNOSIS — K869 Disease of pancreas, unspecified: Secondary | ICD-10-CM

## 2023-10-11 DIAGNOSIS — E872 Acidosis, unspecified: Secondary | ICD-10-CM | POA: Diagnosis not present

## 2023-10-11 DIAGNOSIS — R531 Weakness: Secondary | ICD-10-CM | POA: Diagnosis not present

## 2023-10-11 DIAGNOSIS — I10 Essential (primary) hypertension: Secondary | ICD-10-CM

## 2023-10-11 DIAGNOSIS — R634 Abnormal weight loss: Secondary | ICD-10-CM

## 2023-10-11 DIAGNOSIS — K8689 Other specified diseases of pancreas: Secondary | ICD-10-CM | POA: Diagnosis not present

## 2023-10-11 DIAGNOSIS — E039 Hypothyroidism, unspecified: Secondary | ICD-10-CM

## 2023-10-11 HISTORY — PX: EUS: SHX5427

## 2023-10-11 HISTORY — PX: ESOPHAGOGASTRODUODENOSCOPY (EGD) WITH PROPOFOL: SHX5813

## 2023-10-11 HISTORY — PX: FINE NEEDLE ASPIRATION: SHX5430

## 2023-10-11 LAB — BASIC METABOLIC PANEL
Anion gap: 7 (ref 5–15)
BUN: 5 mg/dL — ABNORMAL LOW (ref 8–23)
CO2: 24 mmol/L (ref 22–32)
Calcium: 8.2 mg/dL — ABNORMAL LOW (ref 8.9–10.3)
Chloride: 106 mmol/L (ref 98–111)
Creatinine, Ser: 0.57 mg/dL (ref 0.44–1.00)
GFR, Estimated: >60 mL/min (ref >60)
Glucose, Bld: 274 mg/dL — ABNORMAL HIGH (ref 70–99)
Potassium: 3.8 mmol/L (ref 3.5–5.1)
Sodium: 137 mmol/L (ref 135–145)

## 2023-10-11 LAB — CANCER ANTIGEN 19-9
CA 19-9: 88 U/mL — ABNORMAL HIGH (ref 0–35)
CA 19-9: 76 U/mL — ABNORMAL HIGH (ref 0–35)

## 2023-10-11 LAB — CBC
HCT: 31.9 % — ABNORMAL LOW (ref 36.0–46.0)
Hemoglobin: 10 g/dL — ABNORMAL LOW (ref 12.0–15.0)
MCH: 20.9 pg — ABNORMAL LOW (ref 26.0–34.0)
MCHC: 31.3 g/dL (ref 30.0–36.0)
MCV: 66.6 fL — ABNORMAL LOW (ref 80.0–100.0)
Platelets: 202 10*3/uL (ref 150–400)
RBC: 4.79 MIL/uL (ref 3.87–5.11)
RDW: 18 % — ABNORMAL HIGH (ref 11.5–15.5)
WBC: 4 10*3/uL (ref 4.0–10.5)
nRBC: 0 % (ref 0.0–0.2)

## 2023-10-11 LAB — GLUCOSE, CAPILLARY
Glucose-Capillary: 244 mg/dL — ABNORMAL HIGH (ref 70–99)
Glucose-Capillary: 174 mg/dL — ABNORMAL HIGH (ref 70–99)
Glucose-Capillary: 204 mg/dL — ABNORMAL HIGH (ref 70–99)
Glucose-Capillary: 300 mg/dL — ABNORMAL HIGH (ref 70–99)

## 2023-10-11 LAB — PHOSPHORUS: Phosphorus: 1.2 mg/dL — ABNORMAL LOW (ref 2.5–4.6)

## 2023-10-11 LAB — MAGNESIUM: Magnesium: 1.8 mg/dL (ref 1.7–2.4)

## 2023-10-11 SURGERY — ESOPHAGOGASTRODUODENOSCOPY (EGD) WITH PROPOFOL
Anesthesia: Monitor Anesthesia Care

## 2023-10-11 MED ORDER — K PHOS MONO-SOD PHOS DI & MONO 155-852-130 MG PO TABS
500.0000 mg | ORAL_TABLET | Freq: Two times a day (BID) | ORAL | Status: AC
  Administered 2023-10-11 – 2023-10-12 (×3): 500 mg via ORAL
  Filled 2023-10-11 (×4): qty 2

## 2023-10-11 MED ORDER — PROPOFOL 10 MG/ML IV BOLUS
INTRAVENOUS | Status: DC | PRN
  Administered 2023-10-11 (×2): 100 mg via INTRAVENOUS
  Administered 2023-10-11: 50 mg via INTRAVENOUS

## 2023-10-11 MED ORDER — PHENYLEPHRINE HCL-NACL 20-0.9 MG/250ML-% IV SOLN
INTRAVENOUS | Status: DC | PRN
  Administered 2023-10-11 (×2): 120 ug via INTRAVENOUS

## 2023-10-11 MED ORDER — INSULIN GLARGINE-YFGN 100 UNIT/ML ~~LOC~~ SOLN
10.0000 [IU] | Freq: Every day | SUBCUTANEOUS | Status: DC
  Administered 2023-10-12: 10 [IU] via SUBCUTANEOUS
  Filled 2023-10-11: qty 0.1

## 2023-10-11 MED ORDER — SODIUM CHLORIDE 0.9 % IV SOLN: INTRAVENOUS | Status: DC | PRN

## 2023-10-11 MED ORDER — INSULIN GLARGINE-YFGN 100 UNIT/ML ~~LOC~~ SOLN
8.0000 [IU] | Freq: Every day | SUBCUTANEOUS | Status: DC
  Administered 2023-10-11: 8 [IU] via SUBCUTANEOUS
  Filled 2023-10-11: qty 0.08

## 2023-10-11 SURGICAL SUPPLY — 14 items

## 2023-10-11 NOTE — TOC CM/SW Note (Signed)
Transition of Care [facility]) - Inpatient Brief Assessment   Patient Details  Name: Felicia Garrett MRN: [id number] Date of Birth: 03-Mar-1956  [facility] CM/SW Contact:    Gala Lewandowsky, RN Phone Number: 10/11/2023, 12:02 PM   Clinical Narrative: Patient presented for abdominal pain and generalized weakness. PTA patient reports that she is independent from home alone. Patient states her sister calls and checks on her daily. Patient still drives and gets to her appointments without any issues. Case Manager discussed PT/OT recommendations for home and the patient declined home health PT/OT at this time. Patient also declined DME rollator and bedside commode. Patient states she has high toilets and should regain her strength once she returns home. Patient states her housekeeper or Benedetto Goad can transport home once stable.   Transition of Care Asessment: Insurance and Status: Insurance coverage has been reviewed Patient has primary care physician: Yes Home environment has been reviewed: reviewed Prior level of function:: independent Prior/Current Home Services: No current home services Social Drivers of Health Review: SDOH reviewed no interventions necessary Readmission risk has been reviewed: Yes Transition of care needs: no transition of care needs at this time

## 2023-10-11 NOTE — Inpatient Diabetes Management (Signed)
Inpatient Diabetes Program Recommendations  AACE/ADA: New Consensus Statement on Inpatient Glycemic Control (2015)  Target Ranges:  Prepandial:   less than 140 mg/dL      Peak postprandial:   less than 180 mg/dL (1-2 hours)      Critically ill patients:  140 - 180 mg/dL   Lab Results  Component Value Date   GLUCAP 174 (H) 10/11/2023   HGBA1C 9.9 (H) 10/04/2023    Review of Glycemic Control  Latest Reference Range & Units 10/10/23 05:15 10/10/23 08:15 10/10/23 11:51 10/10/23 16:46 10/10/23 22:04 10/11/23 07:38 10/11/23 11:58  Glucose-Capillary 70 - 99 mg/dL 638 (H) 756 (H) 433 (H) 335 (H) 313 (H) 244 (H) 174 (H)   Diabetes history: DM  Outpatient Diabetes medications:  Mounjaro 12.5 mg weekly Xigduo XR 01-999 mg q AM Current orders for Inpatient glycemic control:  Novolog 0-9 units tid with meals and HS Semglee 8 units daily  Inpatient Diabetes Program Recommendations:    Patient just got back from procedure.  Briefly discussed plan with patient to stop Mounjaro (per MD) and add insulin.  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  Offered to place CGM prior to the weekend and patient was agreeable.  Assisted in download of [employer] app.  MD ordered application of Freestyle CGM at discharge for patient. Education done regarding application and changing CGM sensor (alternate every 15 days on back of arms), 1 hour warm-up, use of glucometer when alert displays, how to scan CGM for glucose reading and information for PCP. Patient has also been given educational packet regarding use CGM sensor including the 1-800 toll free number for any questions, problems or needs related to the [facility] sensors or reader.    Sensor applied by DM coord. to (R Arm at ([phone]).  Explained that glucose readings will not be available until 1 hour after application. Reviewed use of CGM including how to scan, changing Sensor, Vitamin C warning,  arrows with glucose readings, and Freestyle app.  Patient appreciative. On call DM coord. Will follow for discharge recommendations over the weekend.   Thanks,  Lorenza Cambridge, RN, BC-ADM Inpatient Diabetes Coordinator Pager [phone]  (8a-5p)

## 2023-10-11 NOTE — Progress Notes (Signed)
[facility]                                                                              Felicia Garrett, is a 68 y.o. female, DOB - 09-06-1956, ION:[id number] Admit date - 10/08/2023    Outpatient Primary MD for the patient is [name], MD  LOS - 2  days  No chief complaint on file.      Brief summary   Patient is a 68 year old female with history of gastric band surgery, DM type II, HLD, palpitations, anemia, depression, has been on Mounjaro for >2 years (started in 07/2021 per Dr Zella Ball office note), had been doing well, lost 100lbs per patient.  Prior to that was briefly on semaglutide before switch to [facility] and dapagliflozin-metformin.  She had noticed symptoms of GI upset in November 2024 while in [place] on a mission trip, underwent EGD and colonoscopy on 08/14/2023 that was negative.   She had noted epigastric pain, poor appetite, worsening generalized weakness in the last 2 weeks, feeling dizzy otherwise no nausea vomiting diarrhea or fevers. On 1/9, had a televisit with her PCP, urgent care visit on 1/12 and presented to ED on 1/14. In ED, tachycardiac with HR 124, glucose 186, serum bicarb low at 14, lactic acid elevated 2.2 During ED visit, VBG showed acidotic pH at 7.2, pCO2 23.5, serum bicarb 9.6, BHB> 8  CT abdomen showed abnormal appearance of the pancreas suspicious for pancreatic neoplasm, possible mass effect of the superior mesenteric vein, hypodense liver lesions, recommended MRCP.  GI consulted, patient admitted for further workup.  Assessment & Plan    Principal Problem: Euglycemic DKA (diabetic ketoacidosis) (HCC), lactic acidosis -In the setting of diabetes mellitus type 2, NIDDM, uncontrolled with hyperglycemia -Likely acute acidosis from starvation ketoacidosis and combination of euglycemic DKA due todapagliflozin-metformin -DKA resolved, transition to subcutaneous insulin -Hemoglobin A1c 9.9 on 10/04/2023 CBG (last 3)  Recent Labs     10/10/23 2204 10/11/23 0738 10/11/23 1158  GLUCAP 313* 244* 174*   -Increase Semglee to 10 units daily, continue SSI, placed on carb modified diet  Active Problems: Hypokalemia -Replace as needed  Pancreatic lesion -MRCP showed heterogeneous moderately enhancing mass in the pancreatic body/tail concerning for neoplastic process, abrupt narrowing of the superior mesenteric vein/portal vein at the level of confluence.  No metastatic disease identified in the abdomen. -EUS completed today, biopsies pending (Dr Elnoria Howard) [facility] oncology, Dr. Mosetta Putt   hypothyroidism -Continue Synthroid  Depression -Continue Wellbutrin  Generalized debility -PT OT evaluation-> health PT OT  Mild hyponatremia -Likely due to dehydration, poor p.o. intake.  Sodium 134 on admission -Resolved  Hypophosphatemia -Placed on Neutra-Phos BID    Essential hypertension, benign -BP still soft, continue to hold losartan, metoprolol, likely may not need any antihypertensives at discharge  Hyperlipidemia -Continue Lipitor  Estimated body mass index is 20.08 kg/m as calculated from the following:   Height as of this encounter: 5\' 9"  (1.753 m).   Weight as of this encounter: 61.7 kg.  Code Status: Full CODE STATUS DVT Prophylaxis:     Level of Care: Level of care: Progressive Family Communication: Updated patient Disposition Plan:  Remains inpatient appropriate:      Procedures:  MRCP  Consultants:   GI  Antimicrobials:   Anti-infectives (From admission, onward)    None          Medications  atorvastatin  40 mg Oral QPM   buPROPion  150 mg Oral Daily   feeding supplement  1 Container Oral TID BM   guaiFENesin  600 mg Oral BID   insulin aspart  0-5 Units Subcutaneous QHS   insulin aspart  0-9 Units Subcutaneous TID WC   insulin glargine-yfgn  8 Units Subcutaneous Daily   levothyroxine  137 mcg Oral Q0600   ondansetron  4 mg Intravenous Once   phosphorus  500 mg Oral BID       Subjective:   Felicia Garrett was seen and examined today.  No acute complaints this morning, awaiting EUS at the time of my encounter.  No nausea vomiting, chest pain, shortness of breath or fevers.  Objective:   Vitals:   10/11/23 1403 10/11/23 1410 10/11/23 1420 10/11/23 1430  BP: (!) 92/52 101/72 114/67 111/68  Pulse: 77 91 80 82  Resp: (!) 7 13 10 10   Temp:      TempSrc:      SpO2: 97% 97% 98% 97%  Weight:      Height:        Intake/Output Summary (Last 24 hours) at 10/11/2023 1538 Last data filed at 10/11/2023 0810 Gross per 24 hour  Intake 54.38 ml  Output 1050 ml  Net -995.62 ml     Wt Readings from Last 3 Encounters:  10/09/23 61.7 kg  10/03/23 64 kg  08/21/23 70.5 kg    Physical Exam General: Alert and oriented x 3, NAD Cardiovascular: S1 S2 clear, RRR.  Respiratory: CTAB, no wheezing Gastrointestinal: Soft, nontender, nondistended, NBS Ext: no pedal edema bilaterally Neuro: no new deficits Psych: Normal affect      Data Reviewed:  I have personally reviewed following labs    CBC Lab Results  Component Value Date   WBC 4.0 10/11/2023   RBC 4.79 10/11/2023   HGB 10.0 (L) 10/11/2023   HCT 31.9 (L) 10/11/2023   MCV 66.6 (L) 10/11/2023   MCH 20.9 (L) 10/11/2023   PLT 202 10/11/2023   MCHC 31.3 10/11/2023   RDW 18.0 (H) 10/11/2023   LYMPHSABS 0.7 04/25/2023   MONOABS 0.3 04/25/2023   EOSABS 0.0 04/25/2023   BASOSABS 0.0 04/25/2023     Last metabolic panel Lab Results  Component Value Date   NA 137 10/11/2023   K 3.8 10/11/2023   CL 106 10/11/2023   CO2 24 10/11/2023   BUN 5 (L) 10/11/2023   CREATININE 0.57 10/11/2023   GLUCOSE 274 (H) 10/11/2023   GFRNONAA >60 10/11/2023   GFRAA 85 08/04/2020   CALCIUM 8.2 (L) 10/11/2023   PHOS 1.2 (L) 10/11/2023   PROT 5.6 (L) 10/10/2023   ALBUMIN 2.7 (L) 10/10/2023   LABGLOB 2.5 10/04/2023   AGRATIO 1.8 11/26/2022   BILITOT 0.9 10/10/2023   ALKPHOS 59 10/10/2023   AST 14 (L)  10/10/2023   ALT 15 10/10/2023   ANIONGAP 7 10/11/2023    CBG (last 3)  Recent Labs    10/10/23 2204 10/11/23 0738 10/11/23 1158  GLUCAP 313* 244* 174*      Coagulation Profile: No results for input(s): "INR", "PROTIME" in the last 168 hours.   Radiology Studies: I have personally reviewed the imaging studies  No results found.      Ladean Steinmeyer  Omid Deardorff M.D. [facility] 10/11/2023, 3:38 PM  Available via Epic secure chat 7am-7pm After 7 pm, please refer to night coverage provider listed on amion.

## 2023-10-11 NOTE — Interval H&P Note (Signed)
History and Physical Interval Note:  10/11/2023 2:00 PM  Felicia Garrett  has presented today for surgery, with the diagnosis of pancreatic mass.  The various methods of treatment have been discussed with the patient and family. After consideration of risks, benefits and other options for treatment, the patient has consented to  Procedure(s): ESOPHAGOGASTRODUODENOSCOPY (EGD) WITH PROPOFOL (N/A) UPPER ENDOSCOPIC ULTRASOUND (EUS) LINEAR (N/A) FINE NEEDLE ASPIRATION (FNA) LINEAR as a surgical intervention.  The patient's history has been reviewed, patient examined, no change in status, stable for surgery.  I have reviewed the patient's chart and labs.  Questions were answered to the patient's satisfaction.     Cadell Gabrielson D

## 2023-10-11 NOTE — Consult Note (Signed)
[facility]  [phone]   HEMATOLOGY ONCOLOGY INPATIENT CONSULTATION   Felicia Garrett  DOB: [birth year]  MR#: [id number]  CSN#: [id number]    Requesting Physician: [facility] Dr. 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  I was consulted for presumed pancreatic cancer.  She presented with worsening fatigue, abdominal pain and anorexia for the past few months.  Her past medical history is significant for gastric band surgery, type 2 diabetes, and she has been on Mounjaro for at least 1 year and lost about 100 pounds.  She developed intermittent abdominal pain And Nausea her mission trip in November, and had EGD and colonoscopy which were unremarkable.  Due to progressive generalized weakness, loss of appetite and more weight loss, she presented to ED 2 days ago.  Abdominal CT and MRI showed a 2.5 x 7.9 cm mass in pancreatic body and tail, which caused a mass effect on the SMV and the feeding defect in the extrahepatic of portal vein and his SMV portal confluence.  He is scheduled for EUS by Dr. Elnoria Howard tomorrow.  She also has history of iron deficient anemia, has been seen by my partner Dr. Arbutus Ped in the office.  MEDICAL HISTORY:  Past Medical History:  Diagnosis Date   Anemia    Arthritis    Atherosclerosis of aorta (HCC)    Depression    Diabetes mellitus    Hyperlipidemia    Thyroid nodule     SURGICAL HISTORY: Past Surgical History:  Procedure Laterality Date   LAPAROSCOPIC GASTRIC BANDING  06/14/08   THYROIDECTOMY N/A 10/08/2014   Procedure: NEAR TOTAL THYROIDECTOMY;  Surgeon: Valarie Merino, MD;  Location:  [facility];  Service: General;  Laterality: N/A;   UTERINE FIBROID EMBOLIZATION  10 yrs ago    SOCIAL HISTORY: Social History   Socioeconomic History   Marital status: Single    Spouse name: Not on file   Number of children: Not on file   Years of education: Not on file   Highest education level: Not on file  Occupational History   Not on file  Tobacco Use   Smoking status: Never   Smokeless tobacco: Never  Vaping Use   Vaping status: Never Used  Substance and Sexual Activity   Alcohol use: No   Drug use: No   Sexual activity: Never    Comment: meno   Other Topics Concern   Not on file  Social History Narrative   Not on file   Social Drivers of Health   Financial Resource Strain: Low Risk  (10/10/2022)   Overall Financial Resource Strain (CARDIA)    Difficulty of Paying Living Expenses: Not hard at all  Food Insecurity: No Food Insecurity (10/09/2023)   Hunger Vital Sign    Worried About Running Out of Food in the Last Year: Never true    Ran Out of Food in the Last Year: Never true  Transportation Needs: No Transportation Needs (10/09/2023)   PRAPARE - Administrator, Civil Service (Medical): No    Lack of Transportation (Non-Medical): No  Physical Activity: Sufficiently Active (10/10/2022)   Exercise Vital Sign  Days of Exercise per Week: 3 days    Minutes of Exercise per Session: 60 min  Stress: No Stress Concern Present (10/10/2022)   [employer] of Occupational Health - Occupational Stress Questionnaire    Feeling of Stress : Not at all  Social Connections: Unknown (10/09/2023)   Social Connection and Isolation Panel [NHANES]    Frequency of Communication with Friends and Family: Three times a week    Frequency of Social Gatherings with Friends and Family: Not on file    Attends Religious Services: Not on file    Active Member of Clubs or Organizations: Not on file    Attends Banker Meetings: Not on file    Marital Status: Not on  file  Intimate Partner Violence: Not At Risk (10/09/2023)   Humiliation, Afraid, Rape, and Kick questionnaire    Fear of Current or Ex-Partner: No    Emotionally Abused: No    Physically Abused: No    Sexually Abused: No    FAMILY HISTORY: Family History  Problem Relation Age of Onset   Hypertension Mother    Hyperlipidemia Mother    Hyperthyroidism Mother    Diabetes Father    Thyroid disease Sister    Hyperlipidemia Brother    Hypertension Brother    Diabetes Brother     ALLERGIES:  is allergic to penicillins.  MEDICATIONS:  Current Facility-Administered Medications  Medication Dose Route Frequency Provider Last Rate Last Admin   acetaminophen (TYLENOL) tablet 650 mg  650 mg Oral Q6H PRN Jeani Hawking, MD   650 mg at 10/09/23 2308   Or   acetaminophen (TYLENOL) suppository 650 mg  650 mg Rectal Q6H PRN Jeani Hawking, MD       albuterol (PROVENTIL) (2.5 MG/3ML) 0.083% nebulizer solution 2.5 mg  2.5 mg Nebulization Q6H PRN Jeani Hawking, MD       atorvastatin (LIPITOR) tablet 40 mg  40 mg Oral QPM Jeani Hawking, MD   40 mg at 10/11/23 1625   bisacodyl (DULCOLAX) EC tablet 5 mg  5 mg Oral Daily PRN Jeani Hawking, MD       buPROPion (WELLBUTRIN XL) 24 hr tablet 150 mg  150 mg Oral Daily Jeani Hawking, MD   150 mg at 10/11/23 0825   dextrose 50 % solution 0-50 mL  0-50 mL Intravenous PRN Jeani Hawking, MD       feeding supplement (BOOST / RESOURCE BREEZE) liquid 1 Container  1 Container Oral TID BM Jeani Hawking, MD   237 mL at 10/10/23 2231   guaiFENesin (MUCINEX) 12 hr tablet 600 mg  600 mg Oral BID Jeani Hawking, MD   600 mg at 10/10/23 0913   hydrALAZINE (APRESOLINE) injection 10 mg  10 mg Intravenous Q6H PRN Jeani Hawking, MD       HYDROmorphone (DILAUDID) injection 1 mg  1 mg Intravenous Q4H PRN Jeani Hawking, MD   1 mg at 10/11/23 2309   insulin aspart (novoLOG) injection 0-5 Units  0-5 Units Subcutaneous QHS Jeani Hawking, MD   3 Units at 10/11/23 2128   insulin aspart  (novoLOG) injection 0-9 Units  0-9 Units Subcutaneous TID WC Jeani Hawking, MD   3 Units at 10/11/23 1625   [START ON 10/12/2023] insulin glargine-yfgn (SEMGLEE) injection 10 Units  10 Units Subcutaneous Daily Rai, Ripudeep K, MD       levothyroxine (SYNTHROID) tablet 137 mcg  137 mcg Oral Q0600 Jeani Hawking, MD   137 mcg at 10/11/23 0526   ondansetron [facility]) injection  4 mg  4 mg Intravenous Once Jeani Hawking, MD       phosphorus (K PHOS NEUTRAL) tablet 500 mg  500 mg Oral BID Jeani Hawking, MD   500 mg at 10/11/23 2128   polyethylene glycol (MIRALAX / GLYCOLAX) packet 17 g  17 g Oral Daily PRN Jeani Hawking, MD        REVIEW OF SYSTEMS:   Constitutional: Denies fevers, chills or abnormal night sweats, (+) weight loss and generalized fatigue Eyes: Denies blurriness of vision, double vision or watery eyes Ears, nose, mouth, throat, and face: Denies mucositis or sore throat Respiratory: Denies cough, dyspnea or wheezes Cardiovascular: Denies palpitation, chest discomfort or lower extremity swelling Gastrointestinal:  (+) Intermittent abdominal pain, nausea and anorexia Skin: Denies abnormal skin rashes Lymphatics: Denies new lymphadenopathy or easy bruising Neurological:Denies numbness, tingling or new weaknesses Behavioral/Psych: Mood is stable, no new changes  All other systems were reviewed with the patient and are negative.  PHYSICAL EXAMINATION: ECOG PERFORMANCE STATUS: 2 - Symptomatic, <50% confined to bed  Vitals:   10/11/23 2330 10/11/23 2334  BP:  101/66  Pulse: 93 88  Resp:  18  Temp:  98.4 F (36.9 C)  SpO2: 97% 94%   Filed Weights   10/08/23 1122 10/09/23 1731  Weight: 141 lb (64 kg) 136 lb (61.7 kg)    GENERAL:alert, no distress and comfortable SKIN: skin color, texture, turgor are normal, no rashes or significant lesions EYES: normal, conjunctiva are pink and non-injected, sclera clear OROPHARYNX:no exudate, no erythema and lips, buccal mucosa, and tongue  normal  NECK: supple, thyroid normal size, non-tender, without nodularity LYMPH:  no palpable lymphadenopathy in the cervical, axillary or inguinal LUNGS: clear to auscultation and percussion with normal breathing effort HEART: regular rate & rhythm and no murmurs and no lower extremity edema ABDOMEN:abdomen soft, non-tender and normal bowel sounds Musculoskeletal:no cyanosis of digits and no clubbing  PSYCH: alert & oriented x 3 with fluent speech NEURO: no focal motor/sensory deficits  LABORATORY DATA:  I have reviewed the data as listed Lab Results  Component Value Date   WBC 4.0 10/11/2023   HGB 10.0 (L) 10/11/2023   HCT 31.9 (L) 10/11/2023   MCV 66.6 (L) 10/11/2023   PLT 202 10/11/2023   Recent Labs    10/04/23 1102 10/08/23 1128 10/09/23 1246 10/10/23 0216 10/10/23 0759 10/11/23 0517  NA 138   < > 136 136  --  137  K 4.5   < > 3.4* 2.8*  --  3.8  CL 94*   < > 103 106  --  106  CO2 22   < > 12* 22  --  24  GLUCOSE 221*   < > 215* 161*  --  274*  BUN 10   < > 13 8  --  5*  CREATININE 0.73   < > 0.95 0.66  --  0.57  CALCIUM 10.2   < > 8.5* 8.6*  --  8.2*  GFRNONAA  --    < > >60 >60  --  >60  PROT 6.8  --  6.4*  --  5.6*  --   ALBUMIN 4.3  --  3.2*  --  2.7*  --   AST 16  --  14*  --  14*  --   ALT 19  --  17  --  15  --   ALKPHOS 105  --  71  --  59  --   BILITOT 0.6  --  1.3*  --  0.9  --   BILIDIR  --   --   --   --  0.2  --   IBILI  --   --   --   --  0.7  --    < > = values in this interval not displayed.    RADIOGRAPHIC STUDIES: I have personally reviewed the radiological images as listed and agreed with the findings in the report. MR Abdomen W or Wo Contrast Result Date: 10/09/2023 CLINICAL DATA:  Abdominal mass, intra-abdominal neoplasm suspected;. Abdominal pain. EXAM: MRI ABDOMEN WITHOUT AND WITH CONTRAST TECHNIQUE: Multiplanar multisequence MR imaging of the abdomen was performed both before and after the administration of intravenous contrast.  CONTRAST:  6mL GADAVIST GADOBUTROL 1 MMOL/ML IV SOLN COMPARISON:  CT scan abdomen and pelvis from yesterday. FINDINGS: Lower chest: Unremarkable MR appearance to the lung bases. No pleural effusion. No pericardial effusion. Normal heart size. Hepatobiliary: The liver is normal in size and configuration. There is a 1.5 x 1.9 cm simple cyst in the right hepatic lobe, segment 6. There are additional several subcentimeter sized cysts scattered throughout the liver. No intrahepatic or extrahepatic bile duct dilatation. No choledocholithiasis. Unremarkable gallbladder. Pancreas: There is heterogeneous, mildly T2 hyperintense, moderately enhancing mass lesion completely occupying the pancreatic body/tail. The lesion measures at least 3.5 x 7.9 cm orthogonally on axial plane. The pancreatic head and uncinate process are relatively spared in appear grossly within normal limits. The lesion causes abrupt narrowing of the superior mesenteric vein and portal vein at the level of confluence. However, postcontrast images are markedly motion degraded and finding of nonocclusive thrombus in the portal vein at the confluence can not be confirmed on this exam. Spleen:  Within normal limits in size and appearance. No focal mass. Adrenals/Urinary Tract: Unremarkable adrenal glands. No hydroureteronephrosis. No suspicious renal mass. There is a 3.9 x 4.0 cm simple cyst in the left kidney. There is a sinus cyst in the right kidney lower pole. Stomach/Bowel: Note is made of gastric lap band. Visualized portions within the abdomen are unremarkable. No disproportionate dilation of bowel loops. Vascular/Lymphatic: No pathologically enlarged lymph nodes identified. No abdominal aortic aneurysm demonstrated. No ascites. Other:  None. Musculoskeletal: There are multiple, T2 hyperintense lesions in the lumbar spine (L2, L4 and L5 vertebrae) and sacrum (marked with electronic arrow sign on series 11). These are favored to represent hemangioma/bone  cysts. Attention on follow-up examination is recommended. IMPRESSION: 1. Heterogeneous moderately enhancing mass centered in the pancreatic body/tail, is highly concerning for neoplastic process. Differential diagnosis also includes mass forming chronic pancreatitis, however, preferred less likely. Correlate clinically along with tumor markers and tissue sampling. 2. There is abrupt narrowing of the superior mesenteric vein/portal vein at the level of confluence, as discussed above. However, postcontrast images are markedly motion degraded and the finding of nonocclusive thrombus in the portal vein can not be confirmed on this exam. 3. No metastatic disease identified within the abdomen. 4. Multiple other nonacute observations, as described above. Electronically Signed   By: Jules Schick M.D.   On: 10/09/2023 12:26   MR 3D Recon At Scanner Result Date: 10/09/2023 CLINICAL DATA:  Abdominal mass, intra-abdominal neoplasm suspected;. Abdominal pain. EXAM: MRI ABDOMEN WITHOUT AND WITH CONTRAST TECHNIQUE: Multiplanar multisequence MR imaging of the abdomen was performed both before and after the administration of intravenous contrast. CONTRAST:  6mL GADAVIST GADOBUTROL 1 MMOL/ML IV SOLN COMPARISON:  CT scan abdomen and pelvis from yesterday. FINDINGS: Lower chest: Unremarkable MR  appearance to the lung bases. No pleural effusion. No pericardial effusion. Normal heart size. Hepatobiliary: The liver is normal in size and configuration. There is a 1.5 x 1.9 cm simple cyst in the right hepatic lobe, segment 6. There are additional several subcentimeter sized cysts scattered throughout the liver. No intrahepatic or extrahepatic bile duct dilatation. No choledocholithiasis. Unremarkable gallbladder. Pancreas: There is heterogeneous, mildly T2 hyperintense, moderately enhancing mass lesion completely occupying the pancreatic body/tail. The lesion measures at least 3.5 x 7.9 cm orthogonally on axial plane. The pancreatic  head and uncinate process are relatively spared in appear grossly within normal limits. The lesion causes abrupt narrowing of the superior mesenteric vein and portal vein at the level of confluence. However, postcontrast images are markedly motion degraded and finding of nonocclusive thrombus in the portal vein at the confluence can not be confirmed on this exam. Spleen:  Within normal limits in size and appearance. No focal mass. Adrenals/Urinary Tract: Unremarkable adrenal glands. No hydroureteronephrosis. No suspicious renal mass. There is a 3.9 x 4.0 cm simple cyst in the left kidney. There is a sinus cyst in the right kidney lower pole. Stomach/Bowel: Note is made of gastric lap band. Visualized portions within the abdomen are unremarkable. No disproportionate dilation of bowel loops. Vascular/Lymphatic: No pathologically enlarged lymph nodes identified. No abdominal aortic aneurysm demonstrated. No ascites. Other:  None. Musculoskeletal: There are multiple, T2 hyperintense lesions in the lumbar spine (L2, L4 and L5 vertebrae) and sacrum (marked with electronic arrow sign on series 11). These are favored to represent hemangioma/bone cysts. Attention on follow-up examination is recommended. IMPRESSION: 1. Heterogeneous moderately enhancing mass centered in the pancreatic body/tail, is highly concerning for neoplastic process. Differential diagnosis also includes mass forming chronic pancreatitis, however, preferred less likely. Correlate clinically along with tumor markers and tissue sampling. 2. There is abrupt narrowing of the superior mesenteric vein/portal vein at the level of confluence, as discussed above. However, postcontrast images are markedly motion degraded and the finding of nonocclusive thrombus in the portal vein can not be confirmed on this exam. 3. No metastatic disease identified within the abdomen. 4. Multiple other nonacute observations, as described above. Electronically Signed   By: Jules Schick M.D.   On: 10/09/2023 12:26   CT ABDOMEN PELVIS W CONTRAST Result Date: 10/08/2023 CLINICAL DATA:  Acute abdominal pain.  Weakness. EXAM: CT ABDOMEN AND PELVIS WITH CONTRAST TECHNIQUE: Multidetector CT imaging of the abdomen and pelvis was performed using the standard protocol following bolus administration of intravenous contrast. RADIATION DOSE REDUCTION: This exam was performed according to the departmental dose-optimization program which includes automated exposure control, adjustment of the mA and/or kV according to patient size and/or use of iterative reconstruction technique. CONTRAST:  75mL OMNIPAQUE IOHEXOL 350 MG/ML SOLN COMPARISON:  Right upper quadrant ultrasound 08/13/2023 FINDINGS: Lower chest: No acute basilar airspace disease. No pleural effusion. Hepatobiliary: Scattered tiny hepatic hypodensities are too small to characterize. 17 mm hypodense lesion in the posterior right hepatic lobe series 3, image 21. Mild diffuse hepatic steatosis. Possible layering sludge in the gallbladder, no calcified gallstone. Common bile duct is poorly defined, appears to be upper normal at 7 mm. Pancreas: Abnormal appearance of the pancreas with diffuse soft tissue thickening of the pancreatic body, series 3, image 25 and ill-defined low-density in the region of the pancreatic tail, series 3, image 19. Pancreatic tail abnormality spans approximately 5 cm in length. This abnormal density likely encases branches of the celiac artery, in causes mass a effect  and possible invasion on the superior mesenteric vein, series 3, image 26. There is no definite peripancreatic inflammation. Spleen: Normal in size without focal abnormality. Adrenals/Urinary Tract: The adrenal glands are grossly normal, poorly defined. There is no hydronephrosis. No renal calculi. Left renal cyst. No further follow-up imaging is recommended. Partially distended urinary bladder, normal for degree of distension. Stomach/Bowel: Gastric band in  place. The catheter of the gastric band is intimately related to the transverse colon, series 3, image 36. This may be within a haustral fold, however it is difficult to exclude erosion of the catheter into the colon. No associated wall thickening. There is no bowel obstruction or inflammation. Small to moderate volume of colonic stool. The appendix is not definitively seen. Vascular/Lymphatic: Aortic atherosclerosis without aneurysm. There is a filling defect in the extrahepatic portal vein at the SMV portal confluence, series 3, image 25, this is contiguous with pancreatic soft tissue density. This also causes mass effect on the SMV series 3, image 28. There is no bulky abdominopelvic adenopathy. Reproductive: Calcified uterine fibroids.  No adnexal mass. Other: No ascites or free air.  No definite omental thickening. Musculoskeletal: No suspicious bone lesion or acute osseous findings. IMPRESSION: 1. Abnormal appearance of the pancreas which is suspicious for pancreatic neoplasm. Recommend further assessment with pancreatic protocol MRI. This causes mass effect on the superior mesenteric vein. Filling defect within the portal vein at the portal splenic confluence is contiguous with the abnormal pancreas and may represent invasion. 2. Hypodense liver lesions are nonspecific and can be assessed on MRI. 3. Gastric band in place. Portions of the band are intimately related to the transverse colon, presumably within a haustral fold, however difficult to exclude catheter invasion into the lumen. Aortic Atherosclerosis (ICD10-I70.0). Electronically Signed   By: Narda Rutherford M.D.   On: 10/08/2023 21:53   DG Chest 2 View Result Date: 10/08/2023 CLINICAL DATA:  Cough. EXAM: CHEST - 2 VIEW COMPARISON:  01/31/2018. FINDINGS: Bilateral lung fields are clear. Bilateral costophrenic angles are clear. Normal cardio-mediastinal silhouette. No acute osseous abnormalities. The soft tissues are within normal limits. Gastric  lap band is seen. IMPRESSION: No active cardiopulmonary disease. Electronically Signed   By: Jules Schick M.D.   On: 10/08/2023 12:09    ASSESSMENT & PLAN:  68 year old female, with past medical history of diabetes, gastric band surgery, presented with progressive generalized weakness, anorexia and weight loss  Pancreatic mass, highly concerning for pancreatic cancer DM [place] and protein malnutrition Generalized weakness Anorexia and weight loss  Recommendations: -I have personally reviewed her CT and MRI images, and the lab results, discussed the findings with patient in detail. -This is highly concerning for pancreatic adenocarcinoma, with vascular involvement, probably borderline resectable.  No node or distant metastasis.  -She is scheduled for EUS and a biopsy tomorrow by GI Dr. Elnoria Howard, which will give [place] additional evaluation of vascular involvement by the tumor. -I discussed the aggressive nature of pancreatic cancer, and a very high risk of recurrence even after surgical resection.  Will review her case in our GI tumor conference, to see if she is a candidate for surgery.  Either way, she would benefit from chemotherapy, I briefly discussed options of chemo with her.  Due to her low performance status and limited social support, she may not be a good candidate for intensive chemo such as FOLFIRINOX. -Patient wishes to be referred to [facility], I will refer her after her discharge. -I encouraged her to increase nutritional supplement, such as Glucerna,  -  will let her try Mirtazapine for anorexia  -will order CT chest wo contrast to complete staging.  -f/u in office next week when path is back.     All questions were answered. The patient knows to call the clinic with any problems, questions or concerns. I spent a total of 60 minutes for her visit today, more than 50% time on face-to-face counseling.      Malachy Mood, MD 10/11/2023

## 2023-10-11 NOTE — Anesthesia Preprocedure Evaluation (Signed)
Anesthesia Evaluation  Patient identified by MRN, date of birth, ID band Patient awake    Reviewed: Allergy & Precautions, NPO status , Patient's Chart, lab work & pertinent test results, reviewed documented beta blocker date and time   Airway Mallampati: II  TM Distance: >3 FB Neck ROM: Full    Dental  (+) Teeth Intact, Dental Advisory Given   Pulmonary neg pulmonary ROS   Pulmonary exam normal breath sounds clear to auscultation       Cardiovascular hypertension, Pt. on home beta blockers and Pt. on medications Normal cardiovascular exam Rhythm:Regular Rate:Normal     Neuro/Psych  PSYCHIATRIC DISORDERS  Depression    negative neurological ROS     GI/Hepatic negative GI ROS,,,Pancreatic mass   Endo/Other  diabetes, Type 2, Insulin DependentHypothyroidism    Renal/GU negative Renal ROS     Musculoskeletal  (+) Arthritis ,    Abdominal   Peds  Hematology  (+) Blood dyscrasia, anemia   Anesthesia Other Findings Day of surgery medications reviewed with the patient.  Reproductive/Obstetrics                             Anesthesia Physical Anesthesia Plan  ASA: 3  Anesthesia Plan: MAC   Post-op Pain Management: Minimal or no pain anticipated   Induction: Intravenous  PONV Risk Score and Plan: 2 and TIVA and Treatment may vary due to age or medical condition  Airway Management Planned: Natural Airway and Simple Face Mask  Additional Equipment:   Intra-op Plan:   Post-operative Plan:   Informed Consent: I have reviewed the patients History and Physical, chart, labs and discussed the procedure including the risks, benefits and alternatives for the proposed anesthesia with the patient or authorized representative who has indicated his/her understanding and acceptance.     Dental advisory given  Plan Discussed with: CRNA  Anesthesia Plan Comments:        Anesthesia Quick  Evaluation

## 2023-10-11 NOTE — Transfer of Care (Signed)
Immediate Anesthesia Transfer of Care Note  Patient: Felicia Garrett  Procedure(s) Performed: ESOPHAGOGASTRODUODENOSCOPY (EGD) WITH PROPOFOL UPPER ENDOSCOPIC ULTRASOUND (EUS) LINEAR FINE NEEDLE ASPIRATION (FNA) LINEAR  Patient Location: PACU  Anesthesia Type:MAC  Level of Consciousness: awake, alert , and oriented  Airway & Oxygen Therapy: Patient Spontanous Breathing and Patient connected to nasal cannula oxygen  Post-op Assessment: Report given to RN and Post -op Vital signs reviewed and stable  Post vital signs: Reviewed and stable  Last Vitals:  Vitals Value Taken Time  BP 82/51 10/11/23 1357  Temp    Pulse 77 10/11/23 1359  Resp 7 10/11/23 1359  SpO2 98 % 10/11/23 1359  Vitals shown include unfiled device data.  Last Pain:  Vitals:   10/11/23 1245  TempSrc: Temporal  PainSc: 0-No pain      Patients Stated Pain Goal: 0 (10/10/23 1404)  Complications: No notable events documented.

## 2023-10-11 NOTE — Evaluation (Signed)
Occupational Therapy Evaluation Patient Details Name: Felicia Garrett MRN: [id number] DOB: Apr 24, 1956 Today's Date: 10/11/2023   History of Present Illness Pt is a 68 y.o. female who presented 10/08/23 with generalized weakness, abdominal pain, and dizziness. Admitted with euglycemic DKA, lactic acidosis, hypokalemia, and pancreatic lesion. PMH includes DM2, HLD, anemia, diabetic ketoacidosis, and postsurgical hypothyroidism.   Clinical Impression   Pt presents with decline in function and safety with ADLs and ADL mobility with impaired strength, balance and endurance. PTA pt lived at home alone and was Ind with ADLs/selfcare, cooking and mobility. Pt currently requires min A/CGA for safety with LB ADLs and CGA with ADL mobility. Pt would benefit form acute OT services to address impairments to maximize level of function and safety. Recommend HH OT once dc from acute care stay, however pt is declining follow up therapy and any DME at this time  BP Readings Supine 104/64 Sitting 96/60 105/67 sitting EOB ~5 minutes  99//68 sitting after transfer from EOB to commode      If plan is discharge home, recommend the following: A little help with bathing/dressing/bathroom;A little help with walking and/or transfers;Assistance with cooking/housework;Assist for transportation    Functional Status Assessment  Patient has had a recent decline in their functional status and demonstrates the ability to make significant improvements in function in a reasonable and predictable amount of time.  Equipment Recommendations  Tub/shower seat;BSC/3in1    Recommendations for Other Services       Precautions / Restrictions Precautions Precautions: Fall Precaution Comments: watch BP (orthostatic 1/16) Restrictions Weight Bearing Restrictions Per Provider Order: No      Mobility Bed Mobility Overal bed mobility: Modified Independent                  Transfers Overall transfer level: Needs  assistance Equipment used: None Transfers: Sit to/from Stand Sit to Stand: Contact guard assist           General transfer comment: CGA for safety      Balance Overall balance assessment: Mild deficits observed, not formally tested                                         ADL either performed or assessed with clinical judgement   ADL                                               Vision Ability to See in Adequate Light: 0 Adequate Patient Visual Report: No change from baseline       Perception         Praxis         Pertinent Vitals/Pain Pain Assessment Pain Assessment: Faces Faces Pain Scale: Hurts a little bit Pain Location: abdomen Pain Descriptors / Indicators: Discomfort, Sore Pain Intervention(s): Monitored during session, Premedicated before session, Repositioned     Extremity/Trunk Assessment Upper Extremity Assessment Upper Extremity Assessment: Generalized weakness   Lower Extremity Assessment Lower Extremity Assessment: Defer to PT evaluation   Cervical / Trunk Assessment Cervical / Trunk Assessment: Normal   Communication Communication Communication: No apparent difficulties   Cognition Arousal: Alert Behavior During Therapy: WFL for tasks assessed/performed Overall Cognitive Status: Within Functional Limits for tasks assessed  General Comments       Exercises     Shoulder Instructions      Home Living Family/patient expects to be discharged to:: Private residence Living Arrangements: Alone Available Help at Discharge: Friend(s);Available PRN/intermittently Type of Home: House Home Access: Stairs to enter [employer] of Steps: 1 Entrance Stairs-Rails: None Home Layout: One level     Bathroom Shower/Tub: Walk-in shower;Tub/shower unit   Bathroom Toilet: Handicapped height Bathroom Accessibility: Yes   Home Equipment: Toilet  riser;Cane - single point;Electric scooter          Prior Functioning/Environment Prior Level of Function : Independent/Modified Independent;Driving             Mobility Comments: no AD for mobility ADLs Comments: Ind with ADLs/selfcare, IADLs, cooks, has housekeeper who comes every other week.        OT Problem List: Impaired balance (sitting and/or standing);Decreased activity tolerance;Decreased knowledge of use of DME or AE      OT Treatment/Interventions: Self-care/ADL training;DME and/or AE instruction;Therapeutic activities;Patient/family education    OT Goals(Current goals can be found in the care plan section) Acute Rehab OT Goals Patient Stated Goal: go home OT Goal Formulation: With patient Time For Goal Achievement: 10/25/23 Potential to Achieve Goals: Good  OT Frequency: Min 1X/week    Co-evaluation              AM-PAC OT "6 Clicks" Daily Activity     Outcome Measure Help from another person eating meals?: None Help from another person taking care of personal grooming?: A Little Help from another person toileting, which includes using toliet, bedpan, or urinal?: A Little Help from another person bathing (including washing, rinsing, drying)?: A Little Help from another person to put on and taking off regular upper body clothing?: A Little Help from another person to put on and taking off regular lower body clothing?: A Little 6 Click Score: 19   End of Session Equipment Utilized During Treatment: Gait belt  Activity Tolerance: Patient tolerated treatment well Patient left: in bed;with call bell/phone within reach  OT Visit Diagnosis: Muscle weakness (generalized) (M62.81)                Time: 1610-9604 OT Time Calculation (min): 24 min Charges:  OT General Charges $OT Visit: 1 Visit OT Evaluation $OT Eval Moderate Complexity: 1 Mod OT Treatments $Therapeutic Activity: 8-22 mins    Galen Manila 10/11/2023, 1:08 PM

## 2023-10-12 ENCOUNTER — Inpatient Hospital Stay (HOSPITAL_COMMUNITY): Payer: Medicare PPO

## 2023-10-12 DIAGNOSIS — E872 Acidosis, unspecified: Secondary | ICD-10-CM | POA: Diagnosis not present

## 2023-10-12 DIAGNOSIS — K8689 Other specified diseases of pancreas: Secondary | ICD-10-CM | POA: Diagnosis not present

## 2023-10-12 DIAGNOSIS — R531 Weakness: Secondary | ICD-10-CM | POA: Diagnosis not present

## 2023-10-12 LAB — BASIC METABOLIC PANEL
Anion gap: 6 (ref 5–15)
BUN: 6 mg/dL — ABNORMAL LOW (ref 8–23)
CO2: 23 mmol/L (ref 22–32)
Calcium: 8.1 mg/dL — ABNORMAL LOW (ref 8.9–10.3)
Chloride: 108 mmol/L (ref 98–111)
Creatinine, Ser: 0.54 mg/dL (ref 0.44–1.00)
GFR, Estimated: >60 mL/min (ref >60)
Glucose, Bld: 224 mg/dL — ABNORMAL HIGH (ref 70–99)
Potassium: 3.7 mmol/L (ref 3.5–5.1)
Sodium: 137 mmol/L (ref 135–145)

## 2023-10-12 LAB — COMPREHENSIVE METABOLIC PANEL
ALT: 15 U/L (ref 0–44)
AST: 17 U/L (ref 15–41)
Albumin: 2.8 g/dL — ABNORMAL LOW (ref 3.5–5.0)
Alkaline Phosphatase: 61 U/L (ref 38–126)
Anion gap: 8 (ref 5–15)
BUN: 5 mg/dL — ABNORMAL LOW (ref 8–23)
CO2: 27 mmol/L (ref 22–32)
Calcium: 8.5 mg/dL — ABNORMAL LOW (ref 8.9–10.3)
Chloride: 104 mmol/L (ref 98–111)
Creatinine, Ser: 0.65 mg/dL (ref 0.44–1.00)
GFR, Estimated: >60 mL/min (ref >60)
Glucose, Bld: 224 mg/dL — ABNORMAL HIGH (ref 70–99)
Potassium: 3.4 mmol/L — ABNORMAL LOW (ref 3.5–5.1)
Sodium: 139 mmol/L (ref 135–145)
Total Bilirubin: 0.6 mg/dL (ref 0.0–1.2)
Total Protein: 5.8 g/dL — ABNORMAL LOW (ref 6.5–8.1)

## 2023-10-12 LAB — CBC
HCT: 32.3 % — ABNORMAL LOW (ref 36.0–46.0)
Hemoglobin: 10 g/dL — ABNORMAL LOW (ref 12.0–15.0)
MCH: 20.6 pg — ABNORMAL LOW (ref 26.0–34.0)
MCHC: 31 g/dL (ref 30.0–36.0)
MCV: 66.5 fL — ABNORMAL LOW (ref 80.0–100.0)
Platelets: 189 10*3/uL (ref 150–400)
RBC: 4.86 MIL/uL (ref 3.87–5.11)
RDW: 18 % — ABNORMAL HIGH (ref 11.5–15.5)
WBC: 4.5 10*3/uL (ref 4.0–10.5)
nRBC: 0 % (ref 0.0–0.2)

## 2023-10-12 LAB — GLUCOSE, CAPILLARY
Glucose-Capillary: 194 mg/dL — ABNORMAL HIGH (ref 70–99)
Glucose-Capillary: 222 mg/dL — ABNORMAL HIGH (ref 70–99)
Glucose-Capillary: 309 mg/dL — ABNORMAL HIGH (ref 70–99)
Glucose-Capillary: 85 mg/dL (ref 70–99)

## 2023-10-12 MED ORDER — INSULIN ASPART 100 UNIT/ML FLEXPEN: 0.0000 [IU] | PEN_INJECTOR | Freq: Three times a day (TID) | SUBCUTANEOUS | 0 refills | Status: DC

## 2023-10-12 MED ORDER — INSULIN ASPART 100 UNIT/ML IJ SOLN
3.0000 [IU] | Freq: Three times a day (TID) | INTRAMUSCULAR | Status: DC
  Administered 2023-10-12 – 2023-10-13 (×4): 3 [IU] via SUBCUTANEOUS

## 2023-10-12 MED ORDER — INSULIN GLARGINE-YFGN 100 UNIT/ML ~~LOC~~ SOLN
13.0000 [IU] | Freq: Every day | SUBCUTANEOUS | Status: DC
  Administered 2023-10-13: 13 [IU] via SUBCUTANEOUS
  Filled 2023-10-12: qty 0.13

## 2023-10-12 MED ORDER — INSULIN GLARGINE 100 UNIT/ML SOLOSTAR PEN: 10.0000 [IU] | PEN_INJECTOR | Freq: Every day | SUBCUTANEOUS | 0 refills | Status: DC

## 2023-10-12 MED ORDER — INSULIN GLARGINE-YFGN 100 UNIT/ML ~~LOC~~ SOLN
3.0000 [IU] | Freq: Once | SUBCUTANEOUS | Status: AC
  Administered 2023-10-12: 3 [IU] via SUBCUTANEOUS
  Filled 2023-10-12: qty 0.03

## 2023-10-12 MED ORDER — PEN NEEDLES 31G X 5 MM MISC: 1.0000 | Freq: Three times a day (TID) | 0 refills | Status: DC

## 2023-10-12 MED ORDER — BENZONATATE 100 MG PO CAPS
200.0000 mg | ORAL_CAPSULE | Freq: Three times a day (TID) | ORAL | Status: DC | PRN
  Administered 2023-10-12: 200 mg via ORAL
  Filled 2023-10-12: qty 2

## 2023-10-12 MED ORDER — MIRTAZAPINE 7.5 MG PO TABS
7.5000 mg | ORAL_TABLET | Freq: Every day | ORAL | Status: DC
  Filled 2023-10-12 (×2): qty 1

## 2023-10-12 NOTE — Inpatient Diabetes Management (Signed)
Inpatient Diabetes Program Recommendations  AACE/ADA: New Consensus Statement on Inpatient Glycemic Control   Target Ranges:  Prepandial:   less than 140 mg/dL      Peak postprandial:   less than 180 mg/dL (1-2 hours)      Critically ill patients:  140 - 180 mg/dL    Latest Reference Range & Units 10/11/23 07:38 10/11/23 11:58 10/11/23 16:10 10/11/23 21:16 10/12/23 07:28  Glucose-Capillary 70 - 99 mg/dL 841 (H) 324 (H) 401 (H) 300 (H) 194 (H)    Review of Glycemic Control  Diabetes history: DM2 Outpatient Diabetes medications: Lantus 6 units daily (not taking), Mounjaro 12.5 mg Qweek, Xigduo XR 01-999 mg daily Current orders for Inpatient glycemic control: Semglee 8 units daily, Novolog 0-9 units TID with meals, Novolog 0-5 units QHS  Inpatient Diabetes Program Recommendations:    Insulin: Patient received Semglee 8 units on 1/17 and will be receiving Semglee 10 units today.   Outpatient DM: Would recommend to discharge on Lantus 10 units daily, Novolog 3 units TID meal coverage, plus Novolog sensitive correction scale for DM control.  Discharge Recommendations: Other recommendations: Freestyle Libre 3- order number #[id number] Long acting recommendations: Insulin Glargine (LANTUS) Solostar Pen 10 units daily (patient already has prescription that was filled on 1/13)  Short acting recommendations:  Meal coverage and Correction  Insulin aspart (NOVOLOG) FlexPen  0-9 units - Sensitive Scale plus Novolog 3 units TID with meals  Supply/Referral recommendations: Pen needles - standard  Use Adult Diabetes Insulin Treatment Post Discharge order set.  Thanks, Orlando Penner, RN, MSN, CDCES Diabetes Coordinator Inpatient Diabetes Program [phone] (Team Pager from 8am to 5pm)

## 2023-10-12 NOTE — Plan of Care (Signed)
  Problem: Education: Goal: Ability to describe self-care measures that may prevent or decrease complications (Diabetes Survival Skills Education) will improve Outcome: Progressing Goal: Individualized Educational Video(s) Outcome: Progressing   Problem: Coping: Goal: Ability to adjust to condition or change in health will improve Outcome: Progressing   Problem: Fluid Volume: Goal: Ability to maintain a balanced intake and output will improve Outcome: Progressing   Problem: Health Behavior/Discharge Planning: Goal: Ability to identify and utilize available resources and services will improve Outcome: Progressing Goal: Ability to manage health-related needs will improve Outcome: Progressing   Problem: Metabolic: Goal: Ability to maintain appropriate glucose levels will improve Outcome: Progressing   Problem: Nutritional: Goal: Maintenance of adequate nutrition will improve Outcome: Progressing Goal: Progress toward achieving an optimal weight will improve Outcome: Progressing   Problem: Skin Integrity: Goal: Risk for impaired skin integrity will decrease Outcome: Progressing   Problem: Tissue Perfusion: Goal: Adequacy of tissue perfusion will improve Outcome: Progressing   Problem: Education: Goal: Ability to describe self-care measures that may prevent or decrease complications (Diabetes Survival Skills Education) will improve Outcome: Progressing Goal: Individualized Educational Video(s) Outcome: Progressing   Problem: Education: Goal: Ability to describe self-care measures that may prevent or decrease complications (Diabetes Survival Skills Education) will improve Outcome: Progressing Goal: Individualized Educational Video(s) Outcome: Progressing   Problem: Cardiac: Goal: Ability to maintain an adequate cardiac output will improve Outcome: Progressing   Problem: Health Behavior/Discharge Planning: Goal: Ability to identify and utilize available resources and  services will improve Outcome: Progressing Goal: Ability to manage health-related needs will improve Outcome: Progressing   Problem: Fluid Volume: Goal: Ability to achieve a balanced intake and output will improve Outcome: Progressing   Problem: Metabolic: Goal: Ability to maintain appropriate glucose levels will improve Outcome: Progressing   Problem: Nutritional: Goal: Maintenance of adequate nutrition will improve Outcome: Progressing Goal: Maintenance of adequate weight for body size and type will improve Outcome: Progressing   Problem: Respiratory: Goal: Will regain and/or maintain adequate ventilation Outcome: Progressing   Problem: Urinary Elimination: Goal: Ability to achieve and maintain adequate renal perfusion and functioning will improve Outcome: Progressing   Problem: Education: Goal: Knowledge of General Education information will improve Description: Including pain rating scale, medication(s)/side effects and non-pharmacologic comfort measures Outcome: Progressing   Problem: Health Behavior/Discharge Planning: Goal: Ability to manage health-related needs will improve Outcome: Progressing   Problem: Clinical Measurements: Goal: Ability to maintain clinical measurements within normal limits will improve Outcome: Progressing Goal: Will remain free from infection Outcome: Progressing Goal: Diagnostic test results will improve Outcome: Progressing Goal: Respiratory complications will improve Outcome: Progressing Goal: Cardiovascular complication will be avoided Outcome: Progressing   Problem: Activity: Goal: Risk for activity intolerance will decrease Outcome: Progressing   Problem: Nutrition: Goal: Adequate nutrition will be maintained Outcome: Progressing   Problem: Coping: Goal: Level of anxiety will decrease Outcome: Progressing   Problem: Elimination: Goal: Will not experience complications related to bowel motility Outcome:  Progressing Goal: Will not experience complications related to urinary retention Outcome: Progressing   Problem: Pain Managment: Goal: General experience of comfort will improve and/or be controlled Outcome: Progressing   Problem: Safety: Goal: Ability to remain free from injury will improve Outcome: Progressing   Problem: Skin Integrity: Goal: Risk for impaired skin integrity will decrease Outcome: Progressing

## 2023-10-12 NOTE — Plan of Care (Signed)

## 2023-10-12 NOTE — Progress Notes (Signed)
[facility]                                                                              Felicia Garrett, is a 68 y.o. female, DOB - 03-05-1956, EAV:[id number] Admit date - 10/08/2023    Outpatient Primary MD for the patient is [name], MD  LOS - 3  days  No chief complaint on file.      Brief summary   Patient is a 68 year old female with history of gastric band surgery, DM type II, HLD, palpitations, anemia, depression, has been on Mounjaro for >2 years (started in 07/2021 per Dr Zella Ball office note), had been doing well, lost 100lbs per patient.  Prior to that was briefly on semaglutide before switch to [facility] and dapagliflozin-metformin.  She had noticed symptoms of GI upset in November 2024 while in [place] on a mission trip, underwent EGD and colonoscopy on 08/14/2023 that was negative.   She had noted epigastric pain, poor appetite, worsening generalized weakness in the last 2 weeks, feeling dizzy otherwise no nausea vomiting diarrhea or fevers. On 1/9, had a televisit with her PCP, urgent care visit on 1/12 and presented to ED on 1/14. In ED, tachycardiac with HR 124, glucose 186, serum bicarb low at 14, lactic acid elevated 2.2 During ED visit, VBG showed acidotic pH at 7.2, pCO2 23.5, serum bicarb 9.6, BHB> 8  CT abdomen showed abnormal appearance of the pancreas suspicious for pancreatic neoplasm, possible mass effect of the superior mesenteric vein, hypodense liver lesions, recommended MRCP.  GI consulted, patient admitted for further workup.  Assessment & Plan    Principal Problem: Euglycemic DKA (diabetic ketoacidosis) (HCC), lactic acidosis -In the setting of diabetes mellitus type 2, NIDDM, uncontrolled with hyperglycemia -Likely acute acidosis from starvation ketoacidosis and combination of euglycemic DKA due todapagliflozin-metformin -DKA resolved, transition to subcutaneous insulin -Hemoglobin A1c 9.9 on 10/04/2023 CBG (last 3)  Recent Labs     10/11/23 2116 10/12/23 0728 10/12/23 1124  GLUCAP 300* 194* 222*   -Semglee increased to 10 units daily, continue SSI, added NovoLog meal coverage 3 units 3 times daily AC   Active Problems: Hypokalemia -Replace as needed  Pancreatic lesion -MRCP showed heterogeneous moderately enhancing mass in the pancreatic body/tail concerning for neoplastic process, abrupt narrowing of the superior mesenteric vein/portal vein at the level of confluence.  No metastatic disease identified in the abdomen. -EUS completed on 1/17 by Dr. Elnoria Howard, unable to see OR report, biopsy pending -CA 19-9 88-76 -Appreciate oncology mentations, seen by Dr. Mosetta Putt -Ordered CT chest for staging -Ordered mirtazapine 7.5 mg daily for appetite   hypothyroidism -Continue Synthroid  Depression -Continue Wellbutrin  Generalized debility -PT OT evaluation-> health PT OT  Mild hyponatremia -Likely due to dehydration, poor p.o. intake.  Sodium 134 on admission -Resolved  Hypophosphatemia -Placed on Neutra-Phos BID    Essential hypertension, benign -BP still soft, continue to hold losartan, metoprolol, likely may not need any antihypertensives at discharge  Hyperlipidemia -Continue Lipitor  Estimated body mass index is 20.08 kg/m as calculated from the following:   Height as of this encounter: 5\' 9"  (1.753 m).   Weight as  of this encounter: 61.7 kg.  Code Status: Full CODE STATUS DVT Prophylaxis:     Level of Care: Level of care: Progressive Family Communication: Updated patient Disposition Plan:      Remains inpatient appropriate:   Plan for CT chest today, monitor CBGs, hopefully DC home in a.m.   Procedures:  MRCP EUS with biopsy on 10/11/2023  Consultants:   GI Oncology, Dr. Mosetta Putt  Antimicrobials:   Anti-infectives (From admission, onward)    None          Medications  atorvastatin  40 mg Oral QPM   buPROPion  150 mg Oral Daily   feeding supplement  1 Container Oral TID BM    guaiFENesin  600 mg Oral BID   insulin aspart  0-5 Units Subcutaneous QHS   insulin aspart  0-9 Units Subcutaneous TID WC   insulin aspart  3 Units Subcutaneous TID WC   insulin glargine-yfgn  10 Units Subcutaneous Daily   levothyroxine  137 mcg Oral Q0600   ondansetron  4 mg Intravenous Once   phosphorus  500 mg Oral BID      Subjective:   Felicia Garrett was seen and examined today.  No acute complaints, no nausea vomiting, chest pain or shortness of breath.  Eating breakfast.    Objective:   Vitals:   10/12/23 0150 10/12/23 0525 10/12/23 0756 10/12/23 1100  BP:  (!) 100/59 123/79 106/76  Pulse: 84 87  93  Resp:  20 20 20   Temp:  98.6 F (37 C) 98.2 F (36.8 C) 98.4 F (36.9 C)  TempSrc:  Oral Oral Oral  SpO2: 96% 99%  99%  Weight:      Height:        Intake/Output Summary (Last 24 hours) at 10/12/2023 1417 Last data filed at 10/12/2023 1330 Gross per 24 hour  Intake 870 ml  Output 1050 ml  Net -180 ml     Wt Readings from Last 3 Encounters:  10/09/23 61.7 kg  10/03/23 64 kg  08/21/23 70.5 kg   Physical Exam General: Alert and oriented x 3, NAD Cardiovascular: S1 S2 clear, RRR.  Respiratory: CTAB, no wheezing Gastrointestinal: Soft, nontender, nondistended, NBS Ext: no pedal edema bilaterally Neuro: no new deficits Psych: Normal affect        Data Reviewed:  I have personally reviewed following labs    CBC Lab Results  Component Value Date   WBC 4.5 10/12/2023   RBC 4.86 10/12/2023   HGB 10.0 (L) 10/12/2023   HCT 32.3 (L) 10/12/2023   MCV 66.5 (L) 10/12/2023   MCH 20.6 (L) 10/12/2023   PLT 189 10/12/2023   MCHC 31.0 10/12/2023   RDW 18.0 (H) 10/12/2023   LYMPHSABS 0.7 04/25/2023   MONOABS 0.3 04/25/2023   EOSABS 0.0 04/25/2023   BASOSABS 0.0 04/25/2023     Last metabolic panel Lab Results  Component Value Date   NA 139 10/12/2023   K 3.4 (L) 10/12/2023   CL 104 10/12/2023   CO2 27 10/12/2023   BUN 5 (L) 10/12/2023   CREATININE  0.65 10/12/2023   GLUCOSE 224 (H) 10/12/2023   GFRNONAA >60 10/12/2023   GFRAA 85 08/04/2020   CALCIUM 8.5 (L) 10/12/2023   PHOS 1.2 (L) 10/11/2023   PROT 5.8 (L) 10/12/2023   ALBUMIN 2.8 (L) 10/12/2023   LABGLOB 2.5 10/04/2023   AGRATIO 1.8 11/26/2022   BILITOT 0.6 10/12/2023   ALKPHOS 61 10/12/2023   AST 17 10/12/2023   ALT 15 10/12/2023  ANIONGAP 8 10/12/2023    CBG (last 3)  Recent Labs    10/11/23 2116 10/12/23 0728 10/12/23 1124  GLUCAP 300* 194* 222*      Coagulation Profile: No results for input(s): "INR", "PROTIME" in the last 168 hours.   Radiology Studies: I have personally reviewed the imaging studies  No results found.      Thad Ranger M.D. [facility] 10/12/2023, 2:17 PM  Available via Epic secure chat 7am-7pm After 7 pm, please refer to night coverage provider listed on amion.

## 2023-10-12 NOTE — Progress Notes (Signed)
Physical Therapy Treatment Patient Details Name: Felicia Garrett MRN: [id number] DOB: 17-May-1956 Today's Date: 10/12/2023   History of Present Illness Pt is a 68 y.o. female who presented 10/08/23 with generalized weakness, abdominal pain, and dizziness. Admitted with euglycemic DKA, lactic acidosis, hypokalemia, and pancreatic lesion. PMH includes DM2, HLD, anemia, diabetic ketoacidosis, and postsurgical hypothyroidism.    PT Comments  The pt is making great functional progress, ambulating with improved cadence and stability today. However, her BP was noted to continue to drop with activity, see below. RN and MD notified. She was asymptomatic though. She only needed CGA-supervision for safety with all functional mobility without an AD due to her BP dropping. Educated pt on safety concerns with her drop in BP and to maintain her safety at home by either utilizing a rollator in the community to allow her a place to sit, keeping a phone on her at all times or get life alert button, and monitoring for symptoms and sitting/laying down as needed. She verbalized understanding. Will continue to follow acutely.   HR up to 120s when ambulating  BP-  116/69 (83) supine 98/69 (76) sitting 70/30 (45) ambulating 110/71 (83) sitting end of session  *pt asymptomatic throughout    If plan is discharge home, recommend the following: Assistance with cooking/housework   Can travel by Pension scheme manager (4 wheels);[employer]/3in1 (but pt declining)    Recommendations for Other Services       Precautions / Restrictions Precautions Precautions: Fall Precaution Comments: watch BP (orthostatic) Restrictions Weight Bearing Restrictions Per Provider Order: No     Mobility  Bed Mobility Overal bed mobility: Modified Independent             General bed mobility comments: Pt able to perform supine to sitting EOB mod I with HOB elevated.    Transfers Overall  transfer level: Needs assistance Equipment used: None Transfers: Sit to/from Stand Sit to Stand: Supervision           General transfer comment: Supervision for safety, no LOB    Ambulation/Gait Ambulation/Gait assistance: Contact guard assist, Supervision Gait Distance (Feet): 390 Feet Assistive device: None Gait Pattern/deviations: Decreased step length - right, Decreased step length - left, Step-through pattern, Decreased dorsiflexion - right, Decreased dorsiflexion - left Gait velocity: Reduced Gait velocity interpretation: >4.37 ft/sec, indicative of normal walking speed   General Gait Details: Pt ambulated with much improved stability and speed this date. She does slow her gait mildly when cued to turn her head L <> R or look up <> down, but no LOB. Able to change speeds and directions quickly without LOB. CGA-supervision provided for safety due to noted drop in BP, but pt asymptomatic. Cues provided for bil feet clearance due to noted forefoot initial contact bil intermittently   Stairs             Wheelchair Mobility     Tilt Bed    Modified Rankin (Stroke Patients Only)       Balance Overall balance assessment: No apparent balance deficits (not formally assessed)                                          Cognition Arousal: Alert Behavior During Therapy: WFL for tasks assessed/performed Overall Cognitive Status: Within Functional Limits for tasks assessed  Exercises      General Comments General comments (skin integrity, edema, etc.): HR up to 120s when ambulating; BP 116/69 (83) supine, 98/69 (76) sitting, 70/30 (45) ambulating, 110/71 (83) sitting end of session; pt denied symptoms; educated pt on safety concerns with her drop in BP and to maintain her safety at home by either utilizing a rollator in the community to allow her a place to sit, keeping a phone on her at all times  or get life alert button, and monitoring for symptoms and sitting/laying down as needed, she verbalized understanding      Pertinent Vitals/Pain Pain Assessment Pain Assessment: No/denies pain    Home Living                          Prior Function            PT Goals (current goals can now be found in the care plan section) Acute Rehab PT Goals Patient Stated Goal: to get better PT Goal Formulation: With patient Time For Goal Achievement: 10/24/23 Potential to Achieve Goals: Good Progress towards PT goals: Progressing toward goals    Frequency    Min 1X/week      PT Plan      Co-evaluation              AM-PAC PT "6 Clicks" Mobility   Outcome Measure  Help needed turning from your back to your side while in a flat bed without using bedrails?: None Help needed moving from lying on your back to sitting on the side of a flat bed without using bedrails?: None Help needed moving to and from a bed to a chair (including a wheelchair)?: A Little Help needed standing up from a chair using your arms (e.g., wheelchair or bedside chair)?: A Little Help needed to walk in hospital room?: A Little Help needed climbing 3-5 steps with a railing? : A Little 6 Click Score: 20    End of Session Equipment Utilized During Treatment: Gait belt Activity Tolerance: Patient tolerated treatment well Patient left: in chair;with call bell/phone within reach;with chair alarm set Nurse Communication: Mobility status;Other (comment) (BP - notified MD) PT Visit Diagnosis: Unsteadiness on feet (R26.81);Other abnormalities of gait and mobility (R26.89);Muscle weakness (generalized) (M62.81);Difficulty in walking, not elsewhere classified (R26.2);Dizziness and giddiness (R42)     Time: 1610-9604 PT Time Calculation (min) (ACUTE ONLY): 13 min  Charges:    $Therapeutic Activity: 8-22 mins PT General Charges $$ ACUTE PT VISIT: 1 Visit                     Virgil Benedict, PT,  DPT Acute Rehabilitation Services  Office: [phone]    Bettina Gavia 10/12/2023, 5:25 PM

## 2023-10-13 ENCOUNTER — Encounter (HOSPITAL_COMMUNITY): Payer: Self-pay | Admitting: Gastroenterology

## 2023-10-13 DIAGNOSIS — R531 Weakness: Secondary | ICD-10-CM | POA: Diagnosis not present

## 2023-10-13 DIAGNOSIS — E872 Acidosis, unspecified: Secondary | ICD-10-CM | POA: Diagnosis not present

## 2023-10-13 DIAGNOSIS — K8689 Other specified diseases of pancreas: Secondary | ICD-10-CM | POA: Diagnosis not present

## 2023-10-13 DIAGNOSIS — K869 Disease of pancreas, unspecified: Secondary | ICD-10-CM

## 2023-10-13 LAB — CULTURE, BLOOD (ROUTINE X 2)
Culture: NO GROWTH
Culture: NO GROWTH
Special Requests: ADEQUATE
Special Requests: ADEQUATE

## 2023-10-13 LAB — GLUCOSE, CAPILLARY
Glucose-Capillary: 145 mg/dL — ABNORMAL HIGH (ref 70–99)
Glucose-Capillary: 152 mg/dL — ABNORMAL HIGH (ref 70–99)

## 2023-10-13 LAB — CBC
HCT: 30.4 % — ABNORMAL LOW (ref 36.0–46.0)
Hemoglobin: 9.5 g/dL — ABNORMAL LOW (ref 12.0–15.0)
MCH: 20.6 pg — ABNORMAL LOW (ref 26.0–34.0)
MCHC: 31.3 g/dL (ref 30.0–36.0)
MCV: 65.9 fL — ABNORMAL LOW (ref 80.0–100.0)
Platelets: 190 10*3/uL (ref 150–400)
RBC: 4.61 MIL/uL (ref 3.87–5.11)
RDW: 17.4 % — ABNORMAL HIGH (ref 11.5–15.5)
WBC: 4.4 10*3/uL (ref 4.0–10.5)
nRBC: 0 % (ref 0.0–0.2)

## 2023-10-13 LAB — BASIC METABOLIC PANEL
Anion gap: 5 (ref 5–15)
BUN: <5 mg/dL — ABNORMAL LOW (ref 8–23)
CO2: 26 mmol/L (ref 22–32)
Calcium: 8.2 mg/dL — ABNORMAL LOW (ref 8.9–10.3)
Chloride: 105 mmol/L (ref 98–111)
Creatinine, Ser: 0.49 mg/dL (ref 0.44–1.00)
GFR, Estimated: >60 mL/min (ref >60)
Glucose, Bld: 117 mg/dL — ABNORMAL HIGH (ref 70–99)
Potassium: 3 mmol/L — ABNORMAL LOW (ref 3.5–5.1)
Sodium: 136 mmol/L (ref 135–145)

## 2023-10-13 MED ORDER — GUAIFENESIN ER 600 MG PO TB12: 600.0000 mg | ORAL_TABLET | Freq: Two times a day (BID) | ORAL | 0 refills | Status: AC

## 2023-10-13 MED ORDER — POTASSIUM CHLORIDE CRYS ER 20 MEQ PO TBCR
40.0000 meq | EXTENDED_RELEASE_TABLET | Freq: Once | ORAL | Status: AC
  Administered 2023-10-13: 40 meq via ORAL
  Filled 2023-10-13: qty 2

## 2023-10-13 MED ORDER — BLOOD GLUCOSE MONITORING SUPPL DEVI: 1.0000 | Freq: Three times a day (TID) | 0 refills | Status: DC

## 2023-10-13 MED ORDER — PEN NEEDLES 31G X 5 MM MISC: 1.0000 | Freq: Three times a day (TID) | 0 refills | Status: DC

## 2023-10-13 MED ORDER — MIDODRINE HCL 5 MG PO TABS
5.0000 mg | ORAL_TABLET | Freq: Two times a day (BID) | ORAL | Status: DC
  Administered 2023-10-13: 5 mg via ORAL
  Filled 2023-10-13: qty 1

## 2023-10-13 MED ORDER — ONDANSETRON HCL 4 MG PO TABS: 4.0000 mg | ORAL_TABLET | Freq: Three times a day (TID) | ORAL | 1 refills | Status: DC | PRN

## 2023-10-13 MED ORDER — MIDODRINE HCL 2.5 MG PO TABS: 2.5000 mg | ORAL_TABLET | Freq: Two times a day (BID) | ORAL | 0 refills | Status: DC

## 2023-10-13 MED ORDER — BLOOD GLUCOSE TEST VI STRP: 1.0000 | ORAL_STRIP | Freq: Three times a day (TID) | 0 refills | Status: DC

## 2023-10-13 MED ORDER — LANCET DEVICE MISC: 1.0000 | Freq: Three times a day (TID) | 0 refills | Status: DC

## 2023-10-13 MED ORDER — MIRTAZAPINE 7.5 MG PO TABS: 7.5000 mg | ORAL_TABLET | Freq: Every day | ORAL | 1 refills | Status: DC

## 2023-10-13 MED ORDER — LANCETS MISC: 1.0000 | Freq: Three times a day (TID) | 0 refills | Status: DC

## 2023-10-13 MED ORDER — INSULIN GLARGINE 100 UNIT/ML SOLOSTAR PEN: 13.0000 [IU] | PEN_INJECTOR | Freq: Every day | SUBCUTANEOUS | 2 refills | Status: DC

## 2023-10-13 MED ORDER — INSULIN ASPART 100 UNIT/ML FLEXPEN: 0.0000 [IU] | PEN_INJECTOR | Freq: Three times a day (TID) | SUBCUTANEOUS | 0 refills | Status: DC

## 2023-10-13 MED ORDER — BENZONATATE 200 MG PO CAPS: 200.0000 mg | ORAL_CAPSULE | Freq: Three times a day (TID) | ORAL | 0 refills | Status: DC | PRN

## 2023-10-13 MED ORDER — SODIUM CHLORIDE 0.9 % IV BOLUS
1000.0000 mL | Freq: Once | INTRAVENOUS | Status: AC
  Administered 2023-10-13: 1000 mL via INTRAVENOUS

## 2023-10-13 NOTE — Discharge Summary (Signed)
Physician Discharge Summary   Patient: Felicia Garrett MRN: [id number] DOB: 08/13/1956  Admit date:     10/08/2023  Discharge date: 10/13/23  Discharge Physician: Thad Ranger, MD    PCP: [name], MD   Recommendations at discharge:   Started on glargine, Solostar pen, 13 units daily Started on sensitive sliding scale insulin NovoLog FlexPen. Outpatient follow-up with oncology, Dr. Mosetta Putt for biopsy results for the pancreatic lesion, ambulatory referral sent Mounjaro, Xigduo XR discontinued.  Discharge Diagnoses:    DKA (diabetic ketoacidosis) (HCC)   Pancreatic mass  Hypothyroidism   Uncontrolled type 2 diabetes mellitus with hyperglycemia (HCC) Orthostatic hypotension   Essential hypertension, benign Hypophosphatemia   Hospital Course:  Patient is a 68 year old female with history of gastric band surgery, DM type II, HLD, palpitations, anemia, depression, has been on Mounjaro for >2 years (started in 07/2021 per Dr Zella Ball office note), had been doing well, lost 100lbs per patient.  Prior to that was briefly on semaglutide before switch to [facility] and dapagliflozin-metformin.  She had noticed symptoms of GI upset in November 2024 while in [place] on a mission trip, underwent EGD and colonoscopy on 08/14/2023 that was negative.   She had noted epigastric pain, poor appetite, worsening generalized weakness in the last 2 weeks, feeling dizzy otherwise no nausea vomiting diarrhea or fevers. On 1/9, had a televisit with her PCP, urgent care visit on 1/12 and presented to ED on 1/14. In ED, tachycardiac with HR 124, glucose 186, serum bicarb low at 14, lactic acid elevated 2.2 During ED visit, VBG showed acidotic pH at 7.2, pCO2 23.5, serum bicarb 9.6, BHB> 8   CT abdomen showed abnormal appearance of the pancreas suspicious for pancreatic neoplasm, possible mass effect of the superior mesenteric vein, hypodense liver lesions, recommended MRCP.   GI was consulted, patient admitted  for further workup.  Assessment and Plan:  Euglycemic DKA (diabetic ketoacidosis) (HCC), lactic acidosis -In the setting of diabetes mellitus type 2, NIDDM, uncontrolled with hyperglycemia -Likely acute acidosis from starvation ketoacidosis and combination of euglycemic DKA due to dapagliflozin-metformin and Mounjaro -DKA resolved, transition to subcutaneous insulin -Hemoglobin A1c 9.9 on 10/04/2023 -Patient was seen by diabetic coordinator, on discharge continue glargine, FlexPen 13 units daily, NovoLog FlexPen, sliding scale insulin sensitive -Adjust insulin regimen outpatient.  Patient strongly recommended not to take Mounjaro and Xigduo   Hypokalemia -Replaced   Pancreatic lesion -MRCP showed heterogeneous moderately enhancing mass in the pancreatic body/tail concerning for neoplastic process, abrupt narrowing of the superior mesenteric vein/portal vein at the level of confluence.  No metastatic disease identified in the abdomen. -EUS completed on 1/17 by Dr. Elnoria Howard, unable to see OR report -CA [id number].  Oncology was consulted. CT chest showed no metastatic disease -Continue new mirtazapine 7.5 mg daily for appetite -Outpatient appointment with Dr. Mosetta Putt next week for the biopsy results and further plan of management    hypothyroidism -Continue Synthroid   Depression -Continue Wellbutrin   Generalized debility -PT OT evaluation-> health PT OT   Mild hyponatremia -Likely due to dehydration, poor p.o. intake.  Sodium 134 on admission -Resolved   Hypophosphatemia -Completed Neutra-Phos BID 5 days     Essential hypertension, benign with orthostatic hypotension -BP remains soft hence metoprolol and losartan were discontinued.  Patient may not need any antihypertensives at discharge -Given positive orthostatic vital signs. received IV fluid hydration and was placed on low-dose midodrine 2.5 mg twice daily with instructions to hold if her systolic BP stabilizes with no  orthostasis.  Hyperlipidemia -Continue Lipitor   Estimated body mass index is 20.08 kg/m as calculated from the following:   Height as of this encounter: 5\' 9"  (1.753 m).   Weight as of this encounter: 61.7 kg.     Pain control - [employer] Controlled Substance Reporting System database was reviewed. and patient was instructed, not to drive, operate heavy machinery, perform activities at heights, swimming or participation in water activities or provide baby-sitting services while on Pain, Sleep and Anxiety Medications; until their outpatient Physician has advised to do so again. Also recommended to not to take more than prescribed Pain, Sleep and Anxiety Medications.  Consultants: GI, oncology Procedures performed: EUS with biopsy Disposition: Home Diet recommendation:  Discharge Diet Orders (From admission, onward)     Start     Ordered   10/13/23 0000  Diet Carb Modified        10/13/23 1502            DISCHARGE MEDICATION: Allergies as of 10/13/2023       Reactions   Penicillins Shortness Of Breath   Chest pain        Medication List     STOP taking these medications    losartan 50 MG tablet Commonly known as: COZAAR   metoprolol tartrate 25 MG tablet Commonly known as: LOPRESSOR   Mounjaro 12.5 MG/0.5ML Pen Generic drug: tirzepatide   Xigduo XR 01-999 MG Tb24 Generic drug: Dapagliflozin Pro-metFORMIN ER       TAKE these medications    atorvastatin 40 MG tablet Commonly known as: LIPITOR TAKE 1 TABLET BY MOUTH EVERY DAY What changed: when to take this   benzonatate 200 MG capsule Commonly known as: TESSALON Take 1 capsule (200 mg total) by mouth 3 (three) times daily as needed for cough.   Blood Glucose Monitoring Suppl Devi 1 each by Does not apply route 3 (three) times daily. May dispense any manufacturer covered by patient's insurance.   BLOOD GLUCOSE TEST STRIPS Strp 1 each by Does not apply route 3 (three) times daily. Use as  directed to check blood sugar. May dispense any manufacturer covered by patient's insurance and fits patient's device.   buPROPion 150 MG 24 hr tablet Commonly known as: WELLBUTRIN XL TAKE 1 TABLET BY MOUTH EVERY DAY   Drysol 20 % external solution Generic drug: aluminum chloride Apply topically daily as needed.   Fusion Plus Caps TAKE 1 CAPSULE BY MOUTH ONCE DAILY BETWEEN MEALS What changed: See the new instructions.   guaiFENesin 600 MG 12 hr tablet Commonly known as: MUCINEX Take 1 tablet (600 mg total) by mouth 2 (two) times daily for 7 days.   insulin aspart 100 UNIT/ML FlexPen Commonly known as: NOVOLOG Inject 0-6 Units into the skin 3 (three) times daily with meals. Check Blood Glucose (BG) and inject per scale: BG <150= 0 unit; BG 150-200= 1 unit; BG 201-250= 2 unit; BG 251-300= 3 unit; BG 301-350= 4 unit; BG 351-400= 5 unit; BG >400= 6 unit and Call Primary Care.   insulin glargine 100 UNIT/ML Solostar Pen Commonly known as: LANTUS Inject 13 Units into the skin at bedtime. May substitute as needed per insurance. What changed:  how much to take how to take this when to take this additional instructions   Lancet Device Misc 1 each by Does not apply route 3 (three) times daily. May dispense any manufacturer covered by patient's insurance.   Lancets Misc 1 each by Does not apply route 3 (three) times daily.  Use as directed to check blood sugar. May dispense any manufacturer covered by patient's insurance and fits patient's device.   levothyroxine 137 MCG tablet Commonly known as: SYNTHROID Take 1 tablet (137 mcg total) by mouth daily before breakfast.   methocarbamol 750 MG tablet Commonly known as: ROBAXIN TAKE 1 TABLET BY MOUTH 3 TIMES DAILY. What changed:  when to take this reasons to take this   midodrine 2.5 MG tablet Commonly known as: PROAMATINE Take 1 tablet (2.5 mg total) by mouth 2 (two) times daily with a meal.   mirtazapine 7.5 MG tablet Commonly  known as: REMERON Take 1 tablet (7.5 mg total) by mouth at bedtime.   ondansetron 4 MG tablet Commonly known as: Zofran Take 1 tablet (4 mg total) by mouth every 8 (eight) hours as needed for nausea or vomiting.   Pen Needles 31G X 5 MM Misc 1 each by Does not apply route 3 (three) times daily. May dispense any manufacturer covered by patient's insurance.   sodium fluoride 1.1 % Crea dental cream Commonly known as: PREVIDENT 5000 PLUS Place 1 Application onto teeth 2 (two) times a week.        Follow-up Information     Malachy Mood, MD. Call on 10/14/2023.   Specialties: Hematology, Oncology Why: for hospital follow-up next week.  I have sent the hematology/oncology referral for urgent appointment with Dr. Mosetta Putt. Contact information: [address] [place] [address] [phone]         [name], MD Follow up on 10/30/2023.   Specialty: Internal Medicine Why: At 11:40 AM, for hospital follow-up Contact information: [address] [place] [address] [phone]         Charna Elizabeth, MD. Schedule an appointment as soon as possible for a visit in 2 week(s).   Specialty: Gastroenterology Why: for hospital follow-up Contact information: [address] [place] [address] [phone]                Discharge Exam: Filed Weights   10/08/23 1122 10/09/23 1731  Weight: 64 kg 61.7 kg   S: No acute complaints, feeling better today, no nausea vomiting or acute abdominal pain, no diarrhea  BP 111/70 (BP Location: Left Arm)   Pulse 88   Temp 97.9 F (36.6 C) (Oral)   Resp 16   Ht 5\' 9"  (1.753 m)   Wt 61.7 kg   SpO2 100%   BMI 20.08 kg/m   Physical Exam General: Alert and oriented x 3, NAD Cardiovascular: S1 S2 clear, RRR.  Respiratory: CTAB, no wheezing Gastrointestinal: Soft, nontender, nondistended, NBS Ext: no pedal edema bilaterally Neuro: no new deficits Psych: Normal affect     Condition at  discharge: fair  The results of significant diagnostics from this hospitalization (including imaging, microbiology, ancillary and laboratory) are listed below for reference.   Imaging Studies: CT CHEST WO CONTRAST Result Date: 10/12/2023 CLINICAL DATA:  Colon cancer, pancreatic adenocarcinoma, staging examination. * Tracking Code: BO * EXAM: CT CHEST WITHOUT CONTRAST TECHNIQUE: Multidetector CT imaging of the chest was performed following the standard protocol without IV contrast. RADIATION DOSE REDUCTION: This exam was performed according to the departmental dose-optimization program which includes automated exposure control, adjustment of the mA and/or kV according to patient size and/or use of iterative reconstruction technique. COMPARISON:  07/01/2023 FINDINGS: Cardiovascular: No significant vascular findings. Normal heart size. No pericardial effusion. Mediastinum/Nodes: Status post thyroidectomy. Evaluation is slightly limited by lack of mediastinal fat and  contrast administration, however, no definite pathologic thoracic adenopathy. The esophagus is poorly delineated but is grossly unremarkable. Lungs/Pleura: Lungs are clear. No pneumothorax or pleural effusion. No central obstructing lesion. Upper Abdomen: Gastric lap band device is in place within the visualized abdomen. Thickening of the body and tail the pancreas is again identified, better assessed on prior MRI examination of 10/09/2023 Musculoskeletal: No suspicious lytic or blastic bone lesions. Osseous structures are age-appropriate. IMPRESSION: 1. No evidence of metastatic disease within the chest. Electronically Signed   By: Helyn Numbers M.D.   On: 10/12/2023 22:04   MR Abdomen W or Wo Contrast Result Date: 10/09/2023 CLINICAL DATA:  Abdominal mass, intra-abdominal neoplasm suspected;. Abdominal pain. EXAM: MRI ABDOMEN WITHOUT AND WITH CONTRAST TECHNIQUE: Multiplanar multisequence MR imaging of the abdomen was performed both before and  after the administration of intravenous contrast. CONTRAST:  6mL GADAVIST GADOBUTROL 1 MMOL/ML IV SOLN COMPARISON:  CT scan abdomen and pelvis from yesterday. FINDINGS: Lower chest: Unremarkable MR appearance to the lung bases. No pleural effusion. No pericardial effusion. Normal heart size. Hepatobiliary: The liver is normal in size and configuration. There is a 1.5 x 1.9 cm simple cyst in the right hepatic lobe, segment 6. There are additional several subcentimeter sized cysts scattered throughout the liver. No intrahepatic or extrahepatic bile duct dilatation. No choledocholithiasis. Unremarkable gallbladder. Pancreas: There is heterogeneous, mildly T2 hyperintense, moderately enhancing mass lesion completely occupying the pancreatic body/tail. The lesion measures at least 3.5 x 7.9 cm orthogonally on axial plane. The pancreatic head and uncinate process are relatively spared in appear grossly within normal limits. The lesion causes abrupt narrowing of the superior mesenteric vein and portal vein at the level of confluence. However, postcontrast images are markedly motion degraded and finding of nonocclusive thrombus in the portal vein at the confluence can not be confirmed on this exam. Spleen:  Within normal limits in size and appearance. No focal mass. Adrenals/Urinary Tract: Unremarkable adrenal glands. No hydroureteronephrosis. No suspicious renal mass. There is a 3.9 x 4.0 cm simple cyst in the left kidney. There is a sinus cyst in the right kidney lower pole. Stomach/Bowel: Note is made of gastric lap band. Visualized portions within the abdomen are unremarkable. No disproportionate dilation of bowel loops. Vascular/Lymphatic: No pathologically enlarged lymph nodes identified. No abdominal aortic aneurysm demonstrated. No ascites. Other:  None. Musculoskeletal: There are multiple, T2 hyperintense lesions in the lumbar spine (L2, L4 and L5 vertebrae) and sacrum (marked with electronic arrow sign on series  11). These are favored to represent hemangioma/bone cysts. Attention on follow-up examination is recommended. IMPRESSION: 1. Heterogeneous moderately enhancing mass centered in the pancreatic body/tail, is highly concerning for neoplastic process. Differential diagnosis also includes mass forming chronic pancreatitis, however, preferred less likely. Correlate clinically along with tumor markers and tissue sampling. 2. There is abrupt narrowing of the superior mesenteric vein/portal vein at the level of confluence, as discussed above. However, postcontrast images are markedly motion degraded and the finding of nonocclusive thrombus in the portal vein can not be confirmed on this exam. 3. No metastatic disease identified within the abdomen. 4. Multiple other nonacute observations, as described above. Electronically Signed   By: Jules Schick M.D.   On: 10/09/2023 12:26   MR 3D Recon At Scanner Result Date: 10/09/2023 CLINICAL DATA:  Abdominal mass, intra-abdominal neoplasm suspected;. Abdominal pain. EXAM: MRI ABDOMEN WITHOUT AND WITH CONTRAST TECHNIQUE: Multiplanar multisequence MR imaging of the abdomen was performed both before and after the administration of intravenous contrast.  CONTRAST:  6mL GADAVIST GADOBUTROL 1 MMOL/ML IV SOLN COMPARISON:  CT scan abdomen and pelvis from yesterday. FINDINGS: Lower chest: Unremarkable MR appearance to the lung bases. No pleural effusion. No pericardial effusion. Normal heart size. Hepatobiliary: The liver is normal in size and configuration. There is a 1.5 x 1.9 cm simple cyst in the right hepatic lobe, segment 6. There are additional several subcentimeter sized cysts scattered throughout the liver. No intrahepatic or extrahepatic bile duct dilatation. No choledocholithiasis. Unremarkable gallbladder. Pancreas: There is heterogeneous, mildly T2 hyperintense, moderately enhancing mass lesion completely occupying the pancreatic body/tail. The lesion measures at least 3.5 x  7.9 cm orthogonally on axial plane. The pancreatic head and uncinate process are relatively spared in appear grossly within normal limits. The lesion causes abrupt narrowing of the superior mesenteric vein and portal vein at the level of confluence. However, postcontrast images are markedly motion degraded and finding of nonocclusive thrombus in the portal vein at the confluence can not be confirmed on this exam. Spleen:  Within normal limits in size and appearance. No focal mass. Adrenals/Urinary Tract: Unremarkable adrenal glands. No hydroureteronephrosis. No suspicious renal mass. There is a 3.9 x 4.0 cm simple cyst in the left kidney. There is a sinus cyst in the right kidney lower pole. Stomach/Bowel: Note is made of gastric lap band. Visualized portions within the abdomen are unremarkable. No disproportionate dilation of bowel loops. Vascular/Lymphatic: No pathologically enlarged lymph nodes identified. No abdominal aortic aneurysm demonstrated. No ascites. Other:  None. Musculoskeletal: There are multiple, T2 hyperintense lesions in the lumbar spine (L2, L4 and L5 vertebrae) and sacrum (marked with electronic arrow sign on series 11). These are favored to represent hemangioma/bone cysts. Attention on follow-up examination is recommended. IMPRESSION: 1. Heterogeneous moderately enhancing mass centered in the pancreatic body/tail, is highly concerning for neoplastic process. Differential diagnosis also includes mass forming chronic pancreatitis, however, preferred less likely. Correlate clinically along with tumor markers and tissue sampling. 2. There is abrupt narrowing of the superior mesenteric vein/portal vein at the level of confluence, as discussed above. However, postcontrast images are markedly motion degraded and the finding of nonocclusive thrombus in the portal vein can not be confirmed on this exam. 3. No metastatic disease identified within the abdomen. 4. Multiple other nonacute observations, as  described above. Electronically Signed   By: Jules Schick M.D.   On: 10/09/2023 12:26   CT ABDOMEN PELVIS W CONTRAST Result Date: 10/08/2023 CLINICAL DATA:  Acute abdominal pain.  Weakness. EXAM: CT ABDOMEN AND PELVIS WITH CONTRAST TECHNIQUE: Multidetector CT imaging of the abdomen and pelvis was performed using the standard protocol following bolus administration of intravenous contrast. RADIATION DOSE REDUCTION: This exam was performed according to the departmental dose-optimization program which includes automated exposure control, adjustment of the mA and/or kV according to patient size and/or use of iterative reconstruction technique. CONTRAST:  75mL OMNIPAQUE IOHEXOL 350 MG/ML SOLN COMPARISON:  Right upper quadrant ultrasound 08/13/2023 FINDINGS: Lower chest: No acute basilar airspace disease. No pleural effusion. Hepatobiliary: Scattered tiny hepatic hypodensities are too small to characterize. 17 mm hypodense lesion in the posterior right hepatic lobe series 3, image 21. Mild diffuse hepatic steatosis. Possible layering sludge in the gallbladder, no calcified gallstone. Common bile duct is poorly defined, appears to be upper normal at 7 mm. Pancreas: Abnormal appearance of the pancreas with diffuse soft tissue thickening of the pancreatic body, series 3, image 25 and ill-defined low-density in the region of the pancreatic tail, series 3, image 19. Pancreatic  tail abnormality spans approximately 5 cm in length. This abnormal density likely encases branches of the celiac artery, in causes mass a effect and possible invasion on the superior mesenteric vein, series 3, image 26. There is no definite peripancreatic inflammation. Spleen: Normal in size without focal abnormality. Adrenals/Urinary Tract: The adrenal glands are grossly normal, poorly defined. There is no hydronephrosis. No renal calculi. Left renal cyst. No further follow-up imaging is recommended. Partially distended urinary bladder, normal for  degree of distension. Stomach/Bowel: Gastric band in place. The catheter of the gastric band is intimately related to the transverse colon, series 3, image 36. This may be within a haustral fold, however it is difficult to exclude erosion of the catheter into the colon. No associated wall thickening. There is no bowel obstruction or inflammation. Small to moderate volume of colonic stool. The appendix is not definitively seen. Vascular/Lymphatic: Aortic atherosclerosis without aneurysm. There is a filling defect in the extrahepatic portal vein at the SMV portal confluence, series 3, image 25, this is contiguous with pancreatic soft tissue density. This also causes mass effect on the SMV series 3, image 28. There is no bulky abdominopelvic adenopathy. Reproductive: Calcified uterine fibroids.  No adnexal mass. Other: No ascites or free air.  No definite omental thickening. Musculoskeletal: No suspicious bone lesion or acute osseous findings. IMPRESSION: 1. Abnormal appearance of the pancreas which is suspicious for pancreatic neoplasm. Recommend further assessment with pancreatic protocol MRI. This causes mass effect on the superior mesenteric vein. Filling defect within the portal vein at the portal splenic confluence is contiguous with the abnormal pancreas and may represent invasion. 2. Hypodense liver lesions are nonspecific and can be assessed on MRI. 3. Gastric band in place. Portions of the band are intimately related to the transverse colon, presumably within a haustral fold, however difficult to exclude catheter invasion into the lumen. Aortic Atherosclerosis (ICD10-I70.0). Electronically Signed   By: Narda Rutherford M.D.   On: 10/08/2023 21:53   DG Chest 2 View Result Date: 10/08/2023 CLINICAL DATA:  Cough. EXAM: CHEST - 2 VIEW COMPARISON:  01/31/2018. FINDINGS: Bilateral lung fields are clear. Bilateral costophrenic angles are clear. Normal cardio-mediastinal silhouette. No acute osseous abnormalities.  The soft tissues are within normal limits. Gastric lap band is seen. IMPRESSION: No active cardiopulmonary disease. Electronically Signed   By: Jules Schick M.D.   On: 10/08/2023 12:09    Microbiology: Results for orders placed or performed during the hospital encounter of 10/08/23  Blood culture (routine x 2)     Status: None   Collection Time: 10/08/23 11:43 AM   Specimen: BLOOD  Result Value Ref Range Status   Specimen Description BLOOD LEFT ANTECUBITAL  Final   Special Requests   Final    BOTTLES DRAWN AEROBIC AND ANAEROBIC Blood Culture adequate volume   Culture   Final    NO GROWTH 5 DAYS Performed at [facility], [address], [place], [place] [address]    Report Status 10/13/2023 FINAL  Final  Blood culture (routine x 2)     Status: None   Collection Time: 10/08/23 11:48 AM   Specimen: BLOOD  Result Value Ref Range Status   Specimen Description BLOOD RIGHT ANTECUBITAL  Final   Special Requests   Final    BOTTLES DRAWN AEROBIC AND ANAEROBIC Blood Culture adequate volume   Culture   Final    NO GROWTH 5 DAYS Performed at [facility], [address], [place], [place] [address]  Report Status 10/13/2023 FINAL  Final  Resp panel by RT-PCR (RSV, Flu A&B, Covid) Peripheral     Status: None   Collection Time: 10/08/23  1:43 PM   Specimen: Peripheral; Nasal Swab  Result Value Ref Range Status   SARS Coronavirus 2 by RT PCR NEGATIVE NEGATIVE Final   Influenza A by PCR NEGATIVE NEGATIVE Final   Influenza B by PCR NEGATIVE NEGATIVE Final    Comment: (NOTE) The Xpert Xpress SARS-CoV-2/FLU/RSV plus assay is intended as an aid in the diagnosis of influenza from Nasopharyngeal swab specimens and should not be used as a sole basis for treatment. Nasal washings and aspirates are unacceptable for Xpert Xpress SARS-CoV-2/FLU/RSV testing.  Fact Sheet for Patients: BloggerCourse.com  Fact Sheet for Healthcare  Providers: SeriousBroker.it  This test is not yet approved or cleared by the [place] FDA and has been authorized for detection and/or diagnosis of SARS-CoV-2 by FDA under an Emergency Use Authorization (EUA). This EUA will remain in effect (meaning this test can be used) for the duration of the COVID-19 declaration under Section 564(b)(1) of the Act, 21 U.S.C. section 360bbb-3(b)(1), unless the authorization is terminated or revoked.     Resp Syncytial Virus by PCR NEGATIVE NEGATIVE Final    Comment: (NOTE) Fact Sheet for Patients: BloggerCourse.com  Fact Sheet for Healthcare Providers: SeriousBroker.it  This test is not yet approved or cleared by the [place] FDA and has been authorized for detection and/or diagnosis of SARS-CoV-2 by FDA under an Emergency Use Authorization (EUA). This EUA will remain in effect (meaning this test can be used) for the duration of the COVID-19 declaration under Section 564(b)(1) of the Act, 21 U.S.C. section 360bbb-3(b)(1), unless the authorization is terminated or revoked.  Performed at [facility], [address] [place], [place] [address]     Labs: CBC: Recent Labs  Lab 10/09/23 1246 10/10/23 0216 10/11/23 0900 10/12/23 0355 10/13/23 0349  WBC 7.2 5.3 4.0 4.5 4.4  HGB 11.6* 10.8* 10.0* 10.0* 9.5*  HCT 39.3 34.7* 31.9* 32.3* 30.4*  MCV 69.9* 65.6* 66.6* 66.5* 65.9*  PLT 264 238 202 189 190   Basic Metabolic Panel: Recent Labs  Lab 10/09/23 1246 10/10/23 0216 10/10/23 0759 10/11/23 0517 10/12/23 0355 10/12/23 0727 10/13/23 0349  NA 136 136  --  137 137 139 136  K 3.4* 2.8*  --  3.8 3.7 3.4* 3.0*  CL 103 106  --  106 108 104 105  CO2 12* 22  --  24 23 27 26   GLUCOSE 215* 161*  --  274* 224* 224* 117*  BUN 13 8  --  5* 6* 5* <5*  CREATININE 0.95 0.66  --  0.57 0.54 0.65 0.49  CALCIUM 8.5* 8.6*  --  8.2* 8.1* 8.5* 8.2*  MG 1.9   --  1.9 1.8  --   --   --   PHOS 1.9*  --   --  1.2*  --   --   --    Liver Function Tests: Recent Labs  Lab 10/09/23 1246 10/10/23 0759 10/12/23 0727  AST 14* 14* 17  ALT 17 15 15   ALKPHOS 71 59 61  BILITOT 1.3* 0.9 0.6  PROT 6.4* 5.6* 5.8*  ALBUMIN 3.2* 2.7* 2.8*   CBG: Recent Labs  Lab 10/12/23 1124 10/12/23 1619 10/12/23 2123 10/13/23 0739 10/13/23 1128  GLUCAP 222* 309* 85 145* 152*    Discharge time spent: greater than 30 minutes.  Signed: Thad Ranger, MD [facility]  10/13/2023 

## 2023-10-13 NOTE — Plan of Care (Signed)
  Problem: Metabolic: Goal: Ability to maintain appropriate glucose levels will improve Outcome: Progressing   Problem: Nutritional: Goal: Maintenance of adequate nutrition will improve Outcome: Progressing Goal: Maintenance of adequate weight for body size and type will improve Outcome: Progressing

## 2023-10-13 NOTE — Anesthesia Postprocedure Evaluation (Signed)
Anesthesia Post Note  Patient: Secondary school teacher  Procedure(s) Performed: ESOPHAGOGASTRODUODENOSCOPY (EGD) WITH PROPOFOL UPPER ENDOSCOPIC ULTRASOUND (EUS) LINEAR FINE NEEDLE ASPIRATION (FNA) LINEAR     Patient location during evaluation: Endoscopy Anesthesia Type: MAC Level of consciousness: oriented, awake and alert and awake Pain management: pain level controlled Vital Signs Assessment: post-procedure vital signs reviewed and stable Respiratory status: spontaneous breathing, nonlabored ventilation, respiratory function stable and patient connected to nasal cannula oxygen Cardiovascular status: blood pressure returned to baseline and stable Postop Assessment: no headache, no backache and no apparent nausea or vomiting Anesthetic complications: no   No notable events documented.  Last Vitals:    Last Pain:                 Collene Schlichter

## 2023-10-14 ENCOUNTER — Telehealth: Payer: Self-pay

## 2023-10-14 LAB — CYTOLOGY - NON PAP

## 2023-10-14 NOTE — Transitions of Care (Post Inpatient/ED Visit) (Signed)
   10/14/2023  Name: Kenicia Sicking MRN: [id number] DOB: [birth year]  Today's TOC FU Call Status: Today's TOC FU Call Status:: Unsuccessful Call (1st Attempt) Unsuccessful Call (1st Attempt) Date: 10/14/23  Attempted to reach the patient regarding the most recent Inpatient/ED visit.  Follow Up Plan: Additional outreach attempts will be made to reach the patient to complete the Transitions of Care (Post Inpatient/ED visit) call.   Alyse Low, RN, BA, [facility], CRRN [facility] Coordinator, Transition of Care Ph # [phone]

## 2023-10-15 ENCOUNTER — Telehealth: Payer: Self-pay

## 2023-10-15 NOTE — Op Note (Signed)
[facility] Patient Name: Felicia Garrett Procedure Date : 10/11/2023 MRN: [id number] Attending MD: Jeani Hawking , MD, [id number] Date of Birth: 07-04-1956 CSN: [id number] Age: 68 Admit Type: Inpatient Procedure:                Upper EUS Indications:              Suspected solid pancreatic neoplasm Providers:                Jeani Hawking, MD, Stephens Shire RN, RN, Priscella Mann, Technician Referring MD:              Medicines:                Propofol per Anesthesia Complications:            No immediate complications. Estimated Blood Loss:     Estimated blood loss: none. Procedure:                Pre-Anesthesia Assessment:                           - Prior to the procedure, a History and Physical                            was performed, and patient medications and                            allergies were reviewed. The patient's tolerance of                            previous anesthesia was also reviewed. The risks                            and benefits of the procedure and the sedation                            options and risks were discussed with the patient.                            All questions were answered, and informed consent                            was obtained. Prior Anticoagulants: The patient has                            taken Lovenox (enoxaparin), last dose was 1 day                            prior to procedure. ASA Grade Assessment: III - A                            patient with severe systemic disease. After  reviewing the risks and benefits, the patient was                            deemed in satisfactory condition to undergo the                            procedure.                           - Sedation was administered by an anesthesia                            professional. Deep sedation was attained.                           After obtaining informed consent, the endoscope was                             passed under direct vision. Throughout the                            procedure, the patient's blood pressure, pulse, and                            oxygen saturations were monitored continuously. The                            [id number]-UCT180 ([id number]) Olympus linear ultrasound scope                            was introduced through the mouth, and advanced to                            the body of the stomach. The upper EUS was                            technically difficult and complex due to abnormal                            anatomy. The patient tolerated the procedure well. Scope In: Scope Out: Findings:      ENDOSONOGRAPHIC FINDING: :      A round mass was identified in the pancreatic tail. The mass was       hypoechoic. The mass measured 44 mm by 30 mm in maximal cross-sectional       diameter. The outer margins were irregular. Fine needle aspiration for       cytology was performed. Color Doppler imaging was utilized prior to       needle puncture to confirm a lack of significant vascular structures       within the needle path. Five passes were made with the 22 gauge needle       using a transgastric approach. A stylet was used. A cytotechnologist was       present to evaluate the adequacy of the specimen. The cellularity of the       specimen was adequate.  Final cytology results are pending.      The lap band severely limited the movement of the echoendoscope. There       was a sharp angulation in the gastric fundus. A very limited view of the       mass was obtained and it was not safe to push the echoendoscope further       distally. The mass was round, with some irregularity to its borders, but       overall there was a sharp demarcation. It was hypoechoic and large. Only       a portion of the mass was identified. A narrow window for FNA was       obtained and 5 passes were performed with the 22 gauge needle. The last       two passes were for cell block. In the  perisplenic area a small amount       of ascites was identified. Impression:               - A mass was identified in the pancreatic tail.                            Fine needle aspiration performed. Recommendation:           - Return patient to hospital ward for ongoing care.                           - Resume regular diet.                           - Resume Lovenox tomorrow.                           - Await Pathology results. 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 All rights reserved. The codes documented in this report are preliminary and upon coder review may  be revised to  meet current compliance requirements. Jeani Hawking, MD Jeani Hawking, MD 10/15/2023 4:25:52 PM This report has been signed electronically. Number of Addenda: 0

## 2023-10-15 NOTE — Transitions of Care (Post Inpatient/ED Visit) (Signed)
   10/15/2023  Name: Muna Versteeg MRN: [id number] DOB: Nov 19, 1955  Today's TOC FU Call Status: Today's TOC FU Call Status:: Unsuccessful Call (2nd Attempt) Unsuccessful Call (2nd Attempt) Date: 10/15/23  Attempted to reach the patient regarding the most recent Inpatient/ED visit.  Follow Up Plan: Additional outreach attempts will be made to reach the patient to complete the Transitions of Care (Post Inpatient/ED visit) call.   Alyse Low, RN, BA, [facility], CRRN [facility] Coordinator, Transition of Care Ph # [phone]

## 2023-10-16 ENCOUNTER — Telehealth: Payer: Self-pay

## 2023-10-16 DIAGNOSIS — C259 Malignant neoplasm of pancreas, unspecified: Secondary | ICD-10-CM | POA: Insufficient documentation

## 2023-10-16 NOTE — Transitions of Care (Post Inpatient/ED Visit) (Signed)
10/16/2023  Name: Felicia Garrett MRN: [id number] DOB: 08-Dec-1955  Today's TOC FU Call Status: Today's TOC FU Call Status:: Successful TOC FU Call Completed TOC FU Call Complete Date: 10/16/23 Patient's Name and Date of Birth confirmed.  Transition Care Management Follow-up Telephone Call Date of Discharge: 10/13/23 Discharge Facility: [facility]) Type of Discharge: Inpatient Admission Primary Inpatient Discharge Diagnosis:: Metabolic Acidosis, Pancreatic Mass How have you been since you were released from the hospital?: Same Any questions or concerns?: Yes Patient Questions/Concerns:: Concerns about transportation to and from medical appts (provided pt with [facility] transportation benefit # [phone]) Patient Questions/Concerns Addressed: Other: (Concerns about transportation to and from medical appts (provided pt with [facility] transportation benefit # [phone]))  Items Reviewed: Did you receive and understand the discharge instructions provided?: Yes Medications obtained,verified, and reconciled?: Yes (Medications Reviewed) Any new allergies since your discharge?: No Dietary orders reviewed?: No Do you have support at home?: No (lives alone, states she is very weak, hoping to obtain approval from Insurer for Patient Care Assistant, states she has [employer] as well as [employer])  Medications Reviewed Today: Medications Reviewed Today     Reviewed by Marcos Eke, RN (Registered Nurse) on 10/16/23 at 1308  Med List Status: <None>   Medication Order Taking? Sig Documenting Provider Last Dose Status Informant  atorvastatin (LIPITOR) 40 MG tablet [id number] Yes TAKE 1 TABLET BY MOUTH EVERY DAY  Patient taking differently: Take 40 mg by mouth every evening.   [name], MD Taking Active Self, Pharmacy Records  benzonatate [facility]) 200 MG capsule [id number] Yes Take 1 capsule (200 mg total) by mouth 3 (three) times daily as needed for cough. Cathren Harsh,  MD Taking Active   Blood Glucose Monitoring Suppl DEVI [id number] Yes 1 each by Does not apply route 3 (three) times daily. May dispense any manufacturer covered by patient's insurance. Rai, Delene Ruffini, MD Taking Active   buPROPion (WELLBUTRIN XL) 150 MG 24 hr tablet [id number] Yes TAKE 1 TABLET BY MOUTH EVERY DAY [name], MD Taking Active Self, Pharmacy Records  DRYSOL 20 % external solution [id number] Yes Apply topically daily as needed. [name], MD Taking Active Self, Pharmacy Records  Glucose Blood (BLOOD GLUCOSE TEST STRIPS) STRP [id number] Yes 1 each by Does not apply route 3 (three) times daily. Use as directed to check blood sugar. May dispense any manufacturer covered by patient's insurance and fits patient's device. Rai, Delene Ruffini, MD Taking Active   guaiFENesin (MUCINEX) 600 MG 12 hr tablet [id number] Yes Take 1 tablet (600 mg total) by mouth 2 (two) times daily for 7 days. Rai, Delene Ruffini, MD Taking Active   insulin aspart (NOVOLOG) 100 UNIT/ML FlexPen [id number] Yes Inject 0-6 Units into the skin 3 (three) times daily with meals. Check Blood Glucose (BG) and inject per scale: BG <150= 0 unit; BG 150-200= 1 unit; BG 201-250= 2 unit; BG 251-300= 3 unit; BG 301-350= 4 unit; BG 351-400= 5 unit; BG >400= 6 unit and Call Primary Care. Rai, Delene Ruffini, MD Taking Active   insulin glargine (LANTUS) 100 UNIT/ML Solostar Pen [id number] Yes Inject 13 Units into the skin at bedtime. May substitute as needed per insurance. Rai, Ripudeep Kirtland Bouchard, MD Taking Active   Insulin Pen Needle (PEN NEEDLES) 31G X 5 MM MISC [id number] Yes 1 each by Does not apply route 3 (three) times daily. May dispense any manufacturer covered by patient's insurance. Rai, Delene Ruffini, MD Taking Active   Iron-FA-B Cmp-C-Biot-Probiotic (FUSION PLUS) CAPS  [id number] Yes TAKE 1 CAPSULE BY MOUTH ONCE DAILY BETWEEN MEALS  Patient taking differently: Take 1 capsule by mouth 2 (two) times a week.   [name], MD Taking Active Self,  Pharmacy Records           Med Note (COFFELL, Marzella Schlein   Wed Oct 09, 2023 11:54 AM) Pt does not take on any specific days.  Lancet Device MISC [id number] Yes 1 each by Does not apply route 3 (three) times daily. May dispense any manufacturer covered by patient's insurance. Cathren Harsh, MD Taking Active   Lancets MISC [id number] Yes 1 each by Does not apply route 3 (three) times daily. Use as directed to check blood sugar. May dispense any manufacturer covered by patient's insurance and fits patient's device. Rai, Delene Ruffini, MD Taking Active   levothyroxine (SYNTHROID) 137 MCG tablet [id number] Yes Take 1 tablet (137 mcg total) by mouth daily before breakfast. [name], MD Taking Active Self, Pharmacy Records  methocarbamol (ROBAXIN) 750 MG tablet [id number] Yes TAKE 1 TABLET BY MOUTH 3 TIMES DAILY.  Patient taking differently: Take 750 mg by mouth every 6 (six) hours as needed.   [name], MD Taking Active Self, Pharmacy Records  midodrine (PROAMATINE) 2.5 MG tablet [id number] Yes Take 1 tablet (2.5 mg total) by mouth 2 (two) times daily with a meal. Rai, Ripudeep K, MD Taking Active   mirtazapine (REMERON) 7.5 MG tablet [id number] Yes Take 1 tablet (7.5 mg total) by mouth at bedtime. Rai, Delene Ruffini, MD Taking Active   ondansetron (ZOFRAN) 4 MG tablet [id number] Yes Take 1 tablet (4 mg total) by mouth every 8 (eight) hours as needed for nausea or vomiting. Rai, Delene Ruffini, MD Taking Active   sodium fluoride (PREVIDENT 5000 PLUS) 1.1 % CREA dental cream [id number] Yes Place 1 Application onto teeth 2 (two) times a week. [provider] Taking Active Self, Pharmacy Records           Med Note (COFFELL, Marzella Schlein   Wed Oct 09, 2023 11:55 AM) Pt does not take on any specific days.            Home Care and Equipment/Supplies: Were Home Health Services Ordered?: No Any new equipment or medical supplies ordered?: No  Functional Questionnaire: Do you need assistance with  bathing/showering or dressing?: Yes (patient states she is very weak and needs assistance with ADLs) Do you need assistance with meal preparation?: Yes (patient states she is very weak and needs assistance with ADLs) Do you need assistance with eating?: No Do you have difficulty maintaining continence: No Do you need assistance with getting out of bed/getting out of a chair/moving?: No Do you have difficulty managing or taking your medications?: No  Follow up appointments reviewed: PCP Follow-up appointment confirmed?: Yes Date of PCP follow-up appointment?: 10/17/23 Follow-up Provider: Dr. [name] Specialist [facility] Follow-up appointment confirmed?: Yes Date of Specialist follow-up appointment?: 10/17/23 Follow-Up Specialty Provider:: Oncologist, Dr. Mosetta Putt Do you need transportation to your follow-up appointment?: Yes (provided pt with Insurer-provided transportation #, explained the vendor providing the benefit on behalf of the insurer does require 2 days notice to schedule ride to/from appts) Transportation Need Intervention Addressed By:: Other: (provided pt with Insurer-provided transportation #, explained the vendor providing the benefit on behalf of the insurer does require 2 days notice to schedule ride to/from appts) Do you understand care options if your condition(s) worsen?: Yes-patient verbalized understanding    Alyse Low, RN, BA, CHPN, CRRN [facility]  Health Care Management Coordinator, Transition of Care Ph # [phone]

## 2023-10-16 NOTE — Assessment & Plan Note (Signed)
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  Abdominal CT and MRI showed a 2.5 x 7.9 cm mass in the pancreatic body and tail, which caused a mass effect on the SMV and the extrahepatic portal vein and SMV portal confluence. -EUS biopsy showed squamous cell carcinoma, which is very rare in pancreas.

## 2023-10-17 ENCOUNTER — Encounter: Payer: Self-pay | Admitting: Hematology

## 2023-10-17 ENCOUNTER — Inpatient Hospital Stay: Payer: Medicare PPO | Attending: Hematology | Admitting: Hematology

## 2023-10-17 ENCOUNTER — Telehealth: Payer: Self-pay | Admitting: *Deleted

## 2023-10-17 ENCOUNTER — Telehealth: Payer: Medicare PPO | Admitting: Internal Medicine

## 2023-10-17 ENCOUNTER — Inpatient Hospital Stay: Payer: Medicare PPO

## 2023-10-17 ENCOUNTER — Other Ambulatory Visit: Payer: Self-pay | Admitting: *Deleted

## 2023-10-17 ENCOUNTER — Inpatient Hospital Stay: Payer: Medicare PPO | Admitting: Licensed Clinical Social Worker

## 2023-10-17 ENCOUNTER — Ambulatory Visit: Payer: Medicare PPO | Admitting: Internal Medicine

## 2023-10-17 VITALS — BP 96/68 | HR 104 | Temp 97.7°F | Resp 18 | Wt 146.2 lb

## 2023-10-17 DIAGNOSIS — C252 Malignant neoplasm of tail of pancreas: Secondary | ICD-10-CM

## 2023-10-17 DIAGNOSIS — I951 Orthostatic hypotension: Secondary | ICD-10-CM | POA: Diagnosis not present

## 2023-10-17 DIAGNOSIS — E1110 Type 2 diabetes mellitus with ketoacidosis without coma: Secondary | ICD-10-CM

## 2023-10-17 DIAGNOSIS — Z880 Allergy status to penicillin: Secondary | ICD-10-CM | POA: Insufficient documentation

## 2023-10-17 DIAGNOSIS — E7800 Pure hypercholesterolemia, unspecified: Secondary | ICD-10-CM | POA: Diagnosis not present

## 2023-10-17 DIAGNOSIS — E890 Postprocedural hypothyroidism: Secondary | ICD-10-CM

## 2023-10-17 DIAGNOSIS — I10 Essential (primary) hypertension: Secondary | ICD-10-CM | POA: Diagnosis not present

## 2023-10-17 DIAGNOSIS — K5900 Constipation, unspecified: Secondary | ICD-10-CM | POA: Insufficient documentation

## 2023-10-17 LAB — CBC WITH DIFFERENTIAL/PLATELET
Abs Immature Granulocytes: 0.02 10*3/uL (ref 0.00–0.07)
Basophils Absolute: 0 10*3/uL (ref 0.0–0.1)
Basophils Relative: 0 %
Eosinophils Absolute: 0.1 10*3/uL (ref 0.0–0.5)
Eosinophils Relative: 1 %
HCT: 36.6 % (ref 36.0–46.0)
Hemoglobin: 11.2 g/dL — ABNORMAL LOW (ref 12.0–15.0)
Immature Granulocytes: 0 %
Lymphocytes Relative: 15 %
Lymphs Abs: 0.9 10*3/uL (ref 0.7–4.0)
MCH: 20.7 pg — ABNORMAL LOW (ref 26.0–34.0)
MCHC: 30.6 g/dL (ref 30.0–36.0)
MCV: 67.7 fL — ABNORMAL LOW (ref 80.0–100.0)
Monocytes Absolute: 0.7 10*3/uL (ref 0.1–1.0)
Monocytes Relative: 13 %
Neutro Abs: 4 10*3/uL (ref 1.7–7.7)
Neutrophils Relative %: 71 %
Platelets: 294 10*3/uL (ref 150–400)
RBC: 5.41 MIL/uL — ABNORMAL HIGH (ref 3.87–5.11)
RDW: 18.1 % — ABNORMAL HIGH (ref 11.5–15.5)
WBC: 5.7 10*3/uL (ref 4.0–10.5)
nRBC: 0 % (ref 0.0–0.2)

## 2023-10-17 LAB — COMPREHENSIVE METABOLIC PANEL
ALT: 19 U/L (ref 0–44)
AST: 22 U/L (ref 15–41)
Albumin: 3.4 g/dL — ABNORMAL LOW (ref 3.5–5.0)
Alkaline Phosphatase: 73 U/L (ref 38–126)
Anion gap: 8 (ref 5–15)
BUN: 6 mg/dL — ABNORMAL LOW (ref 8–23)
CO2: 30 mmol/L (ref 22–32)
Calcium: 9 mg/dL (ref 8.9–10.3)
Chloride: 99 mmol/L (ref 98–111)
Creatinine, Ser: 0.64 mg/dL (ref 0.44–1.00)
GFR, Estimated: >60 mL/min (ref >60)
Glucose, Bld: 192 mg/dL — ABNORMAL HIGH (ref 70–99)
Potassium: 3.9 mmol/L (ref 3.5–5.1)
Sodium: 137 mmol/L (ref 135–145)
Total Bilirubin: 0.5 mg/dL (ref 0.0–1.2)
Total Protein: 6.5 g/dL (ref 6.5–8.1)

## 2023-10-17 MED ORDER — ONDANSETRON HCL 8 MG PO TABS: 8.0000 mg | ORAL_TABLET | Freq: Three times a day (TID) | ORAL | 1 refills | Status: DC | PRN

## 2023-10-17 MED ORDER — TRAMADOL HCL 50 MG PO TABS: 50.0000 mg | ORAL_TABLET | Freq: Four times a day (QID) | ORAL | 0 refills | Status: DC | PRN

## 2023-10-17 MED ORDER — PROCHLORPERAZINE MALEATE 10 MG PO TABS: 10.0000 mg | ORAL_TABLET | Freq: Four times a day (QID) | ORAL | 1 refills | Status: DC | PRN

## 2023-10-17 MED ORDER — LIDOCAINE-PRILOCAINE 2.5-2.5 % EX CREA: TOPICAL_CREAM | CUTANEOUS | 3 refills | Status: DC

## 2023-10-17 NOTE — Telephone Encounter (Signed)
Referral sent to [facility]. Images pushed through radiology. Fax confirmation received and email received. Patient is scheduled with Dr. Modesta Messing tomorrow at 3:30.  Patient left voicemail cancelling treatment with [facility] and wishes to have all treatments at [facility] related to her rare form of cancer. She understands she may call this clinic if she has any questions or wishes to return at any time.

## 2023-10-17 NOTE — Progress Notes (Signed)
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  Abdominal CT and MRI showed a 2.5 x 7.9 cm mass in the pancreatic body and tail, which caused a mass effect on the SMV and the extrahepatic portal vein and SMV portal confluence. -EUS biopsy showed squamous cell carcinoma, which is very rare in pancreas.   Assessment and Plan    Pancreatic Squamous Cell Carcinoma Diagnosed with a rare form of pancreatic squamous cell carcinoma, located in the body and tail of the pancreas. Tumor size is 3.5 x 7.9 cm, borderline resectable due to involvement with the portal vein and SMV. No evidence of lung cancer on CT chest. Discussed the rarity, potential for surgical resection, chemotherapy, and the need for a PET scan to rule out other primary sites. Emphasized a multidisciplinary approach including tumor board review and referral to [facility]. Discussed chemotherapy risks (neuropathy, diarrhea, low blood counts) and the uncertain prognosis. Patient prefers aggressive treatment. - Order PET scan - Refer to [facility] for surgical and medical oncology evaluation - Schedule port placement for chemotherapy - Start FOLFIRINOX regimen (leucovorin, oxaliplatin, irinotecan, 5-FU) in the first week of February --Chemotherapy consent: Side effects including but does not not limited to, fatigue, nausea, vomiting, diarrhea, hair loss, neuropathy, fluid retention, renal and kidney  dysfunction, neutropenic fever, needed for blood transfusion, bleeding, were discussed with patient in great detail. She agrees to proceed. -The goal of chemotherapy is curative if surgery is planned. - Order genetic testing for pancreatic cancer - Monitor CA 19-9 tumor marker - Refer to dietitian for nutritional support - Refer to social worker for support and future care planning - Prescribe tramadol 20 tablets for pain management - Order CBC and CMP including potassium level - Advise increased fluid intake with electrolytes  Orthostatic Hypotension Experiencing orthostatic hypotension and dizziness, particularly in the morning. Currently on Medrin twice daily. Likely related to dehydration and underlying cancer. - Advise increased fluid intake with electrolytes - Monitor blood pressure regularly  Constipation Reports constipation without rectal bleeding. Likely due to decreased mobility and dietary changes. - Advise high-fiber diet and adequate hydration - Consider stool softeners or laxatives if symptoms persist  General Health Maintenance Emphasized the importance of maintaining overall health during cancer treatment, including a high-protein, high-calorie diet to support weight gain and strength. - Advise high-protein, high-calorie diet - Monitor weight regularly  Plan -I reviewed her scan images and the biopsy results. - Schedule follow-up appointment after PET scan in next 1 to 2 weeks - Schedule port placement and start chemotherapy in the first week of February - Follow-up with primary care physician for ongoing management of blood pressure and general health.  -I made a urgent referral to GI medical oncology and surgical oncology at [facility]        SUMMARY OF ONCOLOGIC HISTORY: Oncology History  Pancreatic cancer (HCC)  10/11/2023 Cancer Staging   Staging form: Exocrine Pancreas, AJCC 8th Edition - Clinical stage from 10/11/2023: Stage IIA (cT3, cN0, cM0) - Signed by  Malachy Mood, MD on 10/16/2023 Total positive nodes: 0   10/16/2023 Initial Diagnosis   Pancreatic cancer (HCC)   10/29/2023 -  Chemotherapy   Patient is on Treatment Plan : PANCREAS Modified FOLFIRINOX q14d x 4 cycles        Discussed the use of AI scribe software for clinical note transcription with the patient, who gave verbal consent to proceed.  History of Present Illness   A 68 year old patient with a recent diagnosis of pancreatic cancer presents for a follow-up after hospital discharge. The patient reports experiencing low blood pressure, particularly in the mornings, which stabilizes later in the day. She is currently taking Medrin twice daily to manage this. She also reports occasional back pain, which she manages with Tylenol. The patient has a history of gastric surgery and is currently living alone, although she mentions that her sister will be coming to stay with her for support. She has been managing daily activities such as building a fire, washing clothes, and taking care of her dog, although she admits to feeling a little tired. Her appetite is fair, and she has been consuming high-protein drinks and maintaining a reasonable diet. She reports no significant weight loss and has actually gained some weight since her hospital admission.         All other systems were reviewed with the patient and are negative.  MEDICAL HISTORY:  Past Medical History:  Diagnosis Date   Anemia    Arthritis    Atherosclerosis of aorta (HCC)    Depression    Diabetes mellitus    Hyperlipidemia    Thyroid nodule     SURGICAL HISTORY: Past Surgical History:  Procedure Laterality Date   ESOPHAGOGASTRODUODENOSCOPY (EGD) WITH PROPOFOL N/A 10/11/2023   Procedure: ESOPHAGOGASTRODUODENOSCOPY (EGD) WITH PROPOFOL;  Surgeon: Jeani Hawking, MD;  Location[facility] END[facility]COPY;  Service: Gastroenterology;  Laterality: N/A;   EUS N/A 10/11/2023   Procedure: UPPER ENDOSCOPIC ULTRASOUND (EUS) LINEAR;  Surgeon:  Jeani Hawking, MD;  Location: [facility] ENDOSCOPY;  Service: Gastroenterology;  Laterality: N/A;   FINE NEEDLE ASPIRATION  10/11/2023   Procedure: FINE NEEDLE ASPIRATION (FNA) LINEAR;  Surgeon: Jeani Hawking, MD;  Location: [facility] ENDOSCOPY;  Service: Gastroenterology;;   LAPAROSCOPIC GASTRIC BANDING  06/14/08   THYROIDECTOMY N/A 10/08/2014   Procedure: NEAR TOTAL THYROIDECTOMY;  Surgeon: Valarie Merino, MD;  Location: [facility] ORS;  Service: General;  Laterality: N/A;   UTERINE FIBROID EMBOLIZATION  10 yrs ago    I have reviewed the social history and family history with the patient and they are unchanged from previous note.  ALLERGIES:  is allergic to penicillins.  MEDICATIONS:  Current Outpatient Medications  Medication Sig Dispense Refill   atorvastatin (LIPITOR) 40 MG tablet TAKE 1 TABLET BY MOUTH EVERY DAY (Patient taking differently: Take 40 mg by mouth every evening.) 90 tablet 1   benzonatate (TESSALON) 200 MG capsule Take 1 capsule (200 mg total) by mouth 3 (three) times daily as needed for cough. 60 capsule 0   Blood Glucose Monitoring Suppl DEVI 1 each by Does not apply route 3 (three) times daily. May dispense any manufacturer covered by patient's insurance. 1 each 0   buPROPion (WELLBUTRIN XL) 150 MG 24 hr tablet TAKE 1 TABLET BY MOUTH EVERY DAY 90 tablet 2   DRYSOL 20 % external solution Apply topically daily as needed. 35 mL 1   Glucose Blood (BLOOD GLUCOSE TEST STRIPS) STRP 1 each by Does not apply route 3 (three) times daily. Use as directed to  check blood sugar. May dispense any manufacturer covered by patient's insurance and fits patient's device. 100 strip 0   guaiFENesin (MUCINEX) 600 MG 12 hr tablet Take 1 tablet (600 mg total) by mouth 2 (two) times daily for 7 days. 14 tablet 0   insulin aspart (NOVOLOG) 100 UNIT/ML FlexPen Inject 0-6 Units into the skin 3 (three) times daily with meals. Check Blood Glucose (BG) and inject per scale: BG <150= 0 unit; BG 150-200= 1 unit; BG 201-250= 2  unit; BG 251-300= 3 unit; BG 301-350= 4 unit; BG 351-400= 5 unit; BG >400= 6 unit and Call Primary Care. 15 mL 0   insulin glargine (LANTUS) 100 UNIT/ML Solostar Pen Inject 13 Units into the skin at bedtime. May substitute as needed per insurance. 15 mL 2   Insulin Pen Needle (PEN NEEDLES) 31G X 5 MM MISC 1 each by Does not apply route 3 (three) times daily. May dispense any manufacturer covered by patient's insurance. 100 each 0   Iron-FA-B Cmp-C-Biot-Probiotic (FUSION PLUS) CAPS TAKE 1 CAPSULE BY MOUTH ONCE DAILY BETWEEN MEALS (Patient taking differently: Take 1 capsule by mouth 2 (two) times a week.) 90 capsule 1   Lancet Device MISC 1 each by Does not apply route 3 (three) times daily. May dispense any manufacturer covered by patient's insurance. 1 each 0   Lancets MISC 1 each by Does not apply route 3 (three) times daily. Use as directed to check blood sugar. May dispense any manufacturer covered by patient's insurance and fits patient's device. 100 each 0   levothyroxine (SYNTHROID) 137 MCG tablet Take 1 tablet (137 mcg total) by mouth daily before breakfast. 90 tablet 2   lidocaine-prilocaine (EMLA) cream Apply to affected area once 30 g 3   methocarbamol (ROBAXIN) 750 MG tablet TAKE 1 TABLET BY MOUTH 3 TIMES DAILY. (Patient taking differently: Take 750 mg by mouth every 6 (six) hours as needed.) 40 tablet 1   midodrine (PROAMATINE) 2.5 MG tablet Take 1 tablet (2.5 mg total) by mouth 2 (two) times daily with a meal. 60 tablet 0   mirtazapine (REMERON) 7.5 MG tablet Take 1 tablet (7.5 mg total) by mouth at bedtime. 30 tablet 1   ondansetron (ZOFRAN) 4 MG tablet Take 1 tablet (4 mg total) by mouth every 8 (eight) hours as needed for nausea or vomiting. 30 tablet 1   ondansetron (ZOFRAN) 8 MG tablet Take 1 tablet (8 mg total) by mouth every 8 (eight) hours as needed for nausea or vomiting. Start on the third day after chemotherapy 30 tablet 1   prochlorperazine (COMPAZINE) 10 MG tablet Take 1 tablet  (10 mg total) by mouth every 6 (six) hours as needed for nausea or vomiting (Nausea or vomiting). 30 tablet 1   sodium fluoride (PREVIDENT 5000 PLUS) 1.1 % CREA dental cream Place 1 Application onto teeth 2 (two) times a week.     No current facility-administered medications for this visit.    PHYSICAL EXAMINATION: ECOG PERFORMANCE STATUS: 2 - Symptomatic, <50% confined to bed  Vitals:   10/17/23 0911 10/17/23 0914  BP: 110/72 96/68  Pulse: (!) 104   Resp: 18   Temp: 97.7 F (36.5 C)   SpO2: 100%    Wt Readings from Last 3 Encounters:  10/17/23 146 lb 3.2 oz (66.3 kg)  10/09/23 136 lb (61.7 kg)  10/03/23 141 lb (64 kg)     GENERAL:alert, no distress and comfortable SKIN: skin color, texture, turgor are normal, no rashes or significant lesions  EYES: normal, Conjunctiva are pink and non-injected, sclera clear NECK: supple, thyroid normal size, non-tender, without nodularity LYMPH:  no palpable lymphadenopathy in the cervical, axillary  LUNGS: clear to auscultation and percussion with normal breathing effort HEART: regular rate & rhythm and no murmurs and no lower extremity edema ABDOMEN:abdomen soft, non-tender and normal bowel sounds, (+) subcutaneous nodule in the epigastric area due to her previous banding surgery Musculoskeletal:no cyanosis of digits and no clubbing  NEURO: alert & oriented x 3 with fluent speech, no focal motor/sensory deficits       LABORATORY DATA:  I have reviewed the data as listed    Latest Ref Rng & Units 10/17/2023    9:49 AM 10/13/2023    3:49 AM 10/12/2023    3:55 AM  CBC  WBC 4.0 - 10.5 K/uL 5.7  4.4  4.5   Hemoglobin 12.0 - 15.0 g/dL 16.1  9.5  09.6   Hematocrit 36.0 - 46.0 % 36.6  30.4  32.3   Platelets 150 - 400 K/uL 294  190  189         Latest Ref Rng & Units 10/17/2023    9:49 AM 10/13/2023    3:49 AM 10/12/2023    7:27 AM  CMP  Glucose 70 - 99 mg/dL 045  409  811   BUN 8 - 23 mg/dL 6  <5  5   Creatinine 9.14 - 1.00 mg/dL 7.82   9.56  2.13   Sodium 135 - 145 mmol/L 137  136  139   Potassium 3.5 - 5.1 mmol/L 3.9  3.0  3.4   Chloride 98 - 111 mmol/L 99  105  104   CO2 22 - 32 mmol/L 30  26  27    Calcium 8.9 - 10.3 mg/dL 9.0  8.2  8.5   Total Protein 6.5 - 8.1 g/dL 6.5   5.8   Total Bilirubin 0.0 - 1.2 mg/dL 0.5   0.6   Alkaline Phos 38 - 126 U/L 73   61   AST 15 - 41 U/L 22   17   ALT 0 - 44 U/L 19   15       RADIOGRAPHIC STUDIES: I have personally reviewed the radiological images as listed and agreed with the findings in the report. No results found.    Orders Placed This Encounter  Procedures   Consent Attestation for Oncology Treatment    The patient is informed of risks, benefits, side-effects of the prescribed oncology treatment. Potential short term and long term side effects and response rates discussed. After a long discussion, the patient made informed decision to proceed.:   Yes   NM PET Image Initial (PI) Skull Base To Thigh    Standing Status:   Future    Expected Date:   10/24/2023    Expiration Date:   10/16/2024    If indicated for the ordered procedure, I authorize the administration of a radiopharmaceutical per Radiology protocol:   Yes    Preferred imaging location?:   [facility]   CBC with Differential/Platelet    Standing Status:   Standing    Number of Occurrences:   50    Expiration Date:   10/16/2024   Comprehensive metabolic panel    Standing Status:   Standing    Number of Occurrences:   50    Expiration Date:   10/16/2024   Cancer antigen 19-9    Standing Status:   Standing    Number of  Occurrences:   20    Expiration Date:   10/16/2024   CBC with Differential (Cancer Center Only)    Standing Status:   Future    Expected Date:   10/29/2023    Expiration Date:   10/28/2024   CMP (Cancer Center only)    Standing Status:   Future    Expected Date:   10/29/2023    Expiration Date:   10/28/2024   CBC with Differential (Cancer Center Only)    Standing Status:   Future    Expected  Date:   11/12/2023    Expiration Date:   11/11/2024   CMP (Cancer Center only)    Standing Status:   Future    Expected Date:   11/12/2023    Expiration Date:   11/11/2024   CBC with Differential (Cancer Center Only)    Standing Status:   Future    Expected Date:   11/26/2023    Expiration Date:   11/25/2024   CMP (Cancer Center only)    Standing Status:   Future    Expected Date:   11/26/2023    Expiration Date:   11/25/2024   CBC with Differential (Cancer Center Only)    Standing Status:   Future    Expected Date:   12/10/2023    Expiration Date:   12/09/2024   CMP (Cancer Center only)    Standing Status:   Future    Expected Date:   12/10/2023    Expiration Date:   12/09/2024   Ambulatory Referral to [facility] Nutrition    Referral Priority:   Routine    Referral Type:   Consultation    Referral Reason:   Specialty Services Required    Number of Visits Requested:   1   Ambulatory referral to Social Work    Referral Priority:   Routine    Referral Type:   Consultation    Referral Reason:   Specialty Services Required    Number of Visits Requested:   1   ONCBCN PHYSICIAN COMMUNICATION 1    0 Number of doses of oxaliplatin received at [facility] or outside facility. signature of Provider. If patient has received greater than 5 doses of oxaliplatin, the following pre-medications should be ordered: dexamethasone, diphenhydramine, and formulary histamine H2 antagonist. If patient cannot tolerate oral histamine H2 antagonist, IV may be given.   All questions were answered. The patient knows to call the clinic with any problems, questions or concerns. No barriers to learning was detected. The total time spent in the appointment was 60 minutes.     Malachy Mood, MD 10/17/2023

## 2023-10-17 NOTE — Progress Notes (Signed)
Virtual Visit via Video Note  I,Victoria T Hamilton, CMA,acting as a Neurosurgeon for [name], MD.,have documented all relevant documentation on the behalf of [name], MD,as directed by  [name], MD while in the presence of [name], MD.  I connected with [name] on 10/27/23 at  2:00 PM EST by a video enabled telemedicine application and verified that I am speaking with the correct person using two identifiers.  Patient Location: Home Provider Location: Office/Clinic  I discussed the limitations, risks, security, and privacy concerns of performing an evaluation and management service by video and the availability of in person appointments. I also discussed with the patient that there may be a patient responsible charge related to this service. The patient expressed understanding and agreed to proceed.  Subjective: PCP: [name], MD  No chief complaint on file.  Patient presents today for hospital follow up. She went to [facility] ED at [facility] on 10/08/23 for further evaluation of worsening weakness, fatigue and abdominal pain.  Unfortunately, she states she was not evaluated for at least 12 hours before being evaluated by an ED provider.  ER evaluation: In ED, tachycardiac with HR 124, glucose 186, serum bicarb low at 14, lactic acid elevated 2.2 During ED visit, VBG showed acidotic pH at 7.2, pCO2 23.5, serum bicarb 9.6, BHB> 8.  CT abdomen showed abnormal appearance of the pancreas suspicious for pancreatic neoplasm, possible mass effect of the superior mesenteric vein, hypodense liver lesions, recommended MRCP.  Hospital course: GI was consulted for MRCP - MRCP showed heterogeneous moderately enhancing mass in the pancreatic body/tail concerning for neoplastic process, abrupt narrowing of the superior mesenteric vein/portal vein at the level of confluence.  No metastatic disease identified in the abdomen.  EUS completed on 1/17 by Dr. Elnoria Howard. Oncology  was also consulted. CT chest showed no metastatic disease. She was started on mirtazapine 7.5mg  in hopes this will increase her appetite.   She was discharged in stable condition on 10/13/23.  She had appt with Oncology earlier today; however, states she would prefer to be followed by [facility].  She expressed this to oncologist; however, she was still scheduled for treatments at [name].      ROS: Per HPI  Current Outpatient Medications:    traMADol (ULTRAM) 50 MG tablet, Take 1 tablet (50 mg total) by mouth every 6 (six) hours as needed., Disp: 20 tablet, Rfl: 0   atorvastatin (LIPITOR) 40 MG tablet, TAKE 1 TABLET BY MOUTH EVERY DAY (Patient taking differently: Take 40 mg by mouth every evening.), Disp: 90 tablet, Rfl: 1   benzonatate (TESSALON) 200 MG capsule, Take 1 capsule (200 mg total) by mouth 3 (three) times daily as needed for cough., Disp: 60 capsule, Rfl: 0   Blood Glucose Monitoring Suppl DEVI, 1 each by Does not apply route 3 (three) times daily. May dispense any manufacturer covered by patient's insurance., Disp: 1 each, Rfl: 0   buPROPion (WELLBUTRIN XL) 150 MG 24 hr tablet, TAKE 1 TABLET BY MOUTH EVERY DAY, Disp: 90 tablet, Rfl: 2   DRYSOL 20 % external solution, Apply topically daily as needed., Disp: 35 mL, Rfl: 1   Glucose Blood (BLOOD GLUCOSE TEST STRIPS) STRP, 1 each by Does not apply route 3 (three) times daily. Use as directed to check blood sugar. May dispense any manufacturer covered by patient's insurance and fits patient's device., Disp: 100 strip, Rfl: 0   insulin aspart (NOVOLOG) 100 UNIT/ML FlexPen, Inject 0-6  Units into the skin 3 (three) times daily with meals. Check Blood Glucose (BG) and inject per scale: BG <150= 0 unit; BG 150-200= 1 unit; BG 201-250= 2 unit; BG 251-300= 3 unit; BG 301-350= 4 unit; BG 351-400= 5 unit; BG >400= 6 unit and Call Primary Care., Disp: 15 mL, Rfl: 0   insulin glargine (LANTUS) 100 UNIT/ML Solostar Pen, Inject 13 Units into the skin at bedtime.  May substitute as needed per insurance., Disp: 15 mL, Rfl: 2   Insulin Pen Needle (PEN NEEDLES) 31G X 5 MM MISC, 1 each by Does not apply route 3 (three) times daily. May dispense any manufacturer covered by patient's insurance., Disp: 100 each, Rfl: 0   Iron-FA-B Cmp-C-Biot-Probiotic (FUSION PLUS) CAPS, TAKE 1 CAPSULE BY MOUTH ONCE DAILY BETWEEN MEALS (Patient taking differently: Take 1 capsule by mouth 2 (two) times a week.), Disp: 90 capsule, Rfl: 1   Lancet Device MISC, 1 each by Does not apply route 3 (three) times daily. May dispense any manufacturer covered by patient's insurance., Disp: 1 each, Rfl: 0   Lancets MISC, 1 each by Does not apply route 3 (three) times daily. Use as directed to check blood sugar. May dispense any manufacturer covered by patient's insurance and fits patient's device., Disp: 100 each, Rfl: 0   levothyroxine (SYNTHROID) 137 MCG tablet, Take 1 tablet (137 mcg total) by mouth daily before breakfast., Disp: 90 tablet, Rfl: 2   lidocaine-prilocaine (EMLA) cream, Apply to affected area once, Disp: 30 g, Rfl: 3   methocarbamol (ROBAXIN) 750 MG tablet, TAKE 1 TABLET BY MOUTH 3 TIMES DAILY. (Patient taking differently: Take 750 mg by mouth every 6 (six) hours as needed.), Disp: 40 tablet, Rfl: 1   midodrine (PROAMATINE) 2.5 MG tablet, Take 1 tablet (2.5 mg total) by mouth 2 (two) times daily with a meal., Disp: 60 tablet, Rfl: 0   mirtazapine (REMERON) 7.5 MG tablet, Take 1 tablet (7.5 mg total) by mouth at bedtime., Disp: 30 tablet, Rfl: 1   ondansetron (ZOFRAN) 4 MG tablet, Take 1 tablet (4 mg total) by mouth every 8 (eight) hours as needed for nausea or vomiting., Disp: 30 tablet, Rfl: 1   ondansetron (ZOFRAN) 8 MG tablet, Take 1 tablet (8 mg total) by mouth every 8 (eight) hours as needed for nausea or vomiting. Start on the third day after chemotherapy, Disp: 30 tablet, Rfl: 1   prochlorperazine (COMPAZINE) 10 MG tablet, Take 1 tablet (10 mg total) by mouth every 6 (six)  hours as needed for nausea or vomiting (Nausea or vomiting)., Disp: 30 tablet, Rfl: 1   sodium fluoride (PREVIDENT 5000 PLUS) 1.1 % CREA dental cream, Place 1 Application onto teeth 2 (two) times a week., Disp: , Rfl:   Observations/Objective: There were no vitals filed for this visit. Physical Exam Vitals and nursing note reviewed.  HENT:     Head: Normocephalic and atraumatic.  Eyes:     Extraocular Movements: Extraocular movements intact.  Pulmonary:     Effort: Pulmonary effort is normal.  Skin:    General: Skin is warm.  Neurological:     General: No focal deficit present.     Mental Status: She is alert.  Psychiatric:        Mood and Affect: Mood normal.        Behavior: Behavior normal.     Assessment and Plan: Diabetic ketoacidosis without coma associated with type 2 diabetes mellitus (HCC) Assessment & Plan: TCM PERFORMED. A MEMBER OF THE CLINICAL  TEAM SPOKE WITH THE PATIENT UPON DISCHARGE. DISCHARGE SUMMARY WAS REVIEWED IN FULL DETAIL DURING THE VISIT. MEDS RECONCILED AND COMPARED TO DISCHARGE MEDS. MEDICATION LIST WAS UPDATED AND REVIEWED WITH THE PATIENT. GREATER THAN 50% FACE TO FACE TIME WAS SPENT IN COUNSELING AND COORDINATION OF CARE. ALL QUESTIONS WERE ANSWERED TO THE SATISFACTION OF THE PATIENT. Mounjaro and Xigduo have been discontinued. She is now on Lantus 13 units nightly.  Most recent A1c reviewed, she will rto in 3 months for re-evaluation.     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 Imaging studies reviewed in detail.  She was evaluated by [facility] earlier today; however, wants to be referred to [facility]. I will place referral today.    Postsurgical hypothyroidism Assessment & Plan: Chronic, continue with current meds. We will need to follow closely since she has had recent weight loss.  Her dose requirement may change over time.   Essential hypertension,  benign Assessment & Plan: She had soft BP readings during hospitalization, losartan was discontinued.    Pure hypercholesterolemia Assessment & Plan: Chronic, she will continue with statin therapy.   Other orders -     traMADol HCl; Take 1 tablet (50 mg total) by mouth every 6 (six) hours as needed.  Dispense: 20 tablet; Refill: 0   TIME IN VISIT: 25 minutes Follow Up Instructions: Return for 3 month dm check.   I discussed the assessment and treatment plan with the patient. The patient was provided an opportunity to ask questions, and all were answered. The patient agreed with the plan and demonstrated an understanding of the instructions.   The patient was advised to call back or seek an in-person evaluation if the symptoms worsen or if the condition fails to improve as anticipated.  The above assessment and management plan was discussed with the patient. The patient verbalized understanding of and has agreed to the management plan.   I, [name], MD, have reviewed all documentation for this visit. The documentation on 10/27/23 for the exam, diagnosis, procedures, and orders are all accurate and complete.

## 2023-10-17 NOTE — Progress Notes (Signed)
START ON PATHWAY REGIMEN - Pancreatic Adenocarcinoma     A cycle is every 14 days:     Irinotecan      Oxaliplatin      Leucovorin      Fluorouracil   **Always confirm dose/schedule in your pharmacy ordering system**  Patient Characteristics: Preoperative, M0 (Clinical Staging), Borderline Resectable, PS = 0,1, BRCA1/2 and PALB2 Mutation Absent/Unknown Therapeutic Status: Preoperative, M0 (Clinical Staging) AJCC T Category: cT3 AJCC N Category: cN0 Resectability Status: Borderline Resectable AJCC M Category: cM0 AJCC 8 Stage Grouping: IIA ECOG Performance Status: 1 BRCA1/2 Mutation Status: Awaiting Test Results PALB2 Mutation Status: Awaiting Test Results Intent of Therapy: Curative Intent, Discussed with Patient

## 2023-10-17 NOTE — Progress Notes (Signed)
[facility] CSW Progress Note  Clinical Child psychotherapist  contacted pt as a new pt assessment.  Pt informed CSW she has an appointment tomorrow w/ [facility] and would like to receive treatment there.  Pt instructed CSW to have chemo education appt cancelled; however, she would like to have her scan done at [facility].  Medical team informed of the above.  Should pt change her mind and decide to have treatment at [facility] she has been reassured that the appointments can be made again.        Rachel Moulds, LCSW Clinical Social Worker [facility]

## 2023-10-18 ENCOUNTER — Other Ambulatory Visit: Payer: Self-pay

## 2023-10-18 DIAGNOSIS — C252 Malignant neoplasm of tail of pancreas: Secondary | ICD-10-CM | POA: Diagnosis not present

## 2023-10-18 LAB — CANCER ANTIGEN 19-9: CA 19-9: 95 U/mL — ABNORMAL HIGH (ref 0–35)

## 2023-10-21 ENCOUNTER — Telehealth: Payer: Self-pay

## 2023-10-21 ENCOUNTER — Telehealth: Payer: Medicare PPO | Admitting: Internal Medicine

## 2023-10-21 ENCOUNTER — Other Ambulatory Visit: Payer: Self-pay

## 2023-10-21 DIAGNOSIS — K769 Liver disease, unspecified: Secondary | ICD-10-CM | POA: Diagnosis not present

## 2023-10-21 DIAGNOSIS — Z01818 Encounter for other preprocedural examination: Secondary | ICD-10-CM | POA: Diagnosis not present

## 2023-10-21 DIAGNOSIS — K869 Disease of pancreas, unspecified: Secondary | ICD-10-CM | POA: Diagnosis not present

## 2023-10-21 DIAGNOSIS — I81 Portal vein thrombosis: Secondary | ICD-10-CM | POA: Diagnosis not present

## 2023-10-21 DIAGNOSIS — K8689 Other specified diseases of pancreas: Secondary | ICD-10-CM | POA: Diagnosis not present

## 2023-10-21 DIAGNOSIS — C786 Secondary malignant neoplasm of retroperitoneum and peritoneum: Secondary | ICD-10-CM | POA: Diagnosis not present

## 2023-10-21 DIAGNOSIS — D1809 Hemangioma of other sites: Secondary | ICD-10-CM | POA: Diagnosis not present

## 2023-10-21 NOTE — Patient Outreach (Signed)
  Care Management  Transitions of Care Program Transitions of Care Post-discharge week 2   10/21/2023 Name: Felicia Garrett MRN: [id number] DOB: [birth year]  Subjective: Felicia Garrett is a 68 y.o. year old female who is a primary care patient of [name], MD. The Care Management team Engaged with patient Engaged with patient by telephone to assess and address transitions of care needs.   Consent to Services:  Patient was initially seen by [facility] Dr. Mosetta Putt for follow up on new diagnosis of "malignant neoplasm of tail of pancreas" but is pursuing her cancer treatments at [facility] and states she will seek support from [facility] RN/LCSW Care management from here on in. Patient is a nurse herself.   Assessment:           SDOH Interventions    Flowsheet Row Clinical Support from 10/10/2022 in [facility]  SDOH Interventions   Food Insecurity Interventions Intervention Not Indicated  Transportation Interventions Intervention Not Indicated  Financial Strain Interventions Intervention Not Indicated  Physical Activity Interventions Intervention Not Indicated  Stress Interventions Intervention Not Indicated        Goals Addressed   None     Plan: The patient has been provided with contact information for the care management team and has been advised to call with any health related questions or concerns.   Alyse Low, RN, BA, [facility], CRRN [facility] Coordinator, Transition of Care Ph # [phone]

## 2023-10-23 ENCOUNTER — Other Ambulatory Visit: Payer: Medicare PPO

## 2023-10-25 DIAGNOSIS — C252 Malignant neoplasm of tail of pancreas: Secondary | ICD-10-CM | POA: Diagnosis not present

## 2023-10-25 DIAGNOSIS — Z01812 Encounter for preprocedural laboratory examination: Secondary | ICD-10-CM | POA: Diagnosis not present

## 2023-10-25 DIAGNOSIS — C259 Malignant neoplasm of pancreas, unspecified: Secondary | ICD-10-CM | POA: Diagnosis not present

## 2023-10-25 DIAGNOSIS — K8689 Other specified diseases of pancreas: Secondary | ICD-10-CM | POA: Diagnosis not present

## 2023-10-27 ENCOUNTER — Encounter: Payer: Self-pay | Admitting: Internal Medicine

## 2023-10-27 DIAGNOSIS — E7800 Pure hypercholesterolemia, unspecified: Secondary | ICD-10-CM | POA: Insufficient documentation

## 2023-10-27 NOTE — Assessment & Plan Note (Signed)
Chronic, continue with current meds. We will need to follow closely since she has had recent weight loss.  Her dose requirement may change over time.

## 2023-10-27 NOTE — Assessment & Plan Note (Signed)
She had soft BP readings during hospitalization, losartan was discontinued.

## 2023-10-27 NOTE — Assessment & Plan Note (Signed)
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 Imaging studies reviewed in detail.  She was evaluated by [facility] Oncology earlier today; however, wants to be referred to [facility]. I will place referral today.

## 2023-10-27 NOTE — Assessment & Plan Note (Signed)
Chronic, she will continue with statin therapy.

## 2023-10-27 NOTE — Assessment & Plan Note (Signed)
TCM PERFORMED. A MEMBER OF THE CLINICAL TEAM SPOKE WITH THE PATIENT UPON DISCHARGE. DISCHARGE SUMMARY WAS REVIEWED IN FULL DETAIL DURING THE VISIT. MEDS RECONCILED AND COMPARED TO DISCHARGE MEDS. MEDICATION LIST WAS UPDATED AND REVIEWED WITH THE PATIENT. GREATER THAN 50% FACE TO FACE TIME WAS SPENT IN COUNSELING AND COORDINATION OF CARE. ALL QUESTIONS WERE ANSWERED TO THE SATISFACTION OF THE PATIENT. Mounjaro and Xigduo have been discontinued. She is now on Lantus 13 units nightly.  Most recent A1c reviewed, she will rto in 3 months for re-evaluation.

## 2023-10-28 ENCOUNTER — Other Ambulatory Visit: Payer: Medicare PPO

## 2023-10-28 ENCOUNTER — Other Ambulatory Visit: Payer: Self-pay

## 2023-10-28 ENCOUNTER — Other Ambulatory Visit: Payer: Self-pay | Admitting: Physician Assistant

## 2023-10-28 ENCOUNTER — Encounter: Payer: Self-pay | Admitting: Internal Medicine

## 2023-10-28 DIAGNOSIS — K668 Other specified disorders of peritoneum: Secondary | ICD-10-CM | POA: Diagnosis not present

## 2023-10-28 DIAGNOSIS — K669 Disorder of peritoneum, unspecified: Secondary | ICD-10-CM | POA: Diagnosis not present

## 2023-10-28 DIAGNOSIS — C259 Malignant neoplasm of pancreas, unspecified: Secondary | ICD-10-CM | POA: Diagnosis not present

## 2023-10-28 DIAGNOSIS — L249 Irritant contact dermatitis, unspecified cause: Secondary | ICD-10-CM

## 2023-10-28 DIAGNOSIS — E785 Hyperlipidemia, unspecified: Secondary | ICD-10-CM

## 2023-10-28 DIAGNOSIS — Z79899 Other long term (current) drug therapy: Secondary | ICD-10-CM | POA: Diagnosis not present

## 2023-10-28 DIAGNOSIS — Z9689 Presence of other specified functional implants: Secondary | ICD-10-CM | POA: Diagnosis not present

## 2023-10-28 MED ORDER — FREESTYLE LIBRE 3 SENSOR MISC: 6 refills | Status: DC

## 2023-10-29 ENCOUNTER — Ambulatory Visit: Payer: Medicare PPO

## 2023-10-29 ENCOUNTER — Other Ambulatory Visit: Payer: Self-pay | Admitting: Internal Medicine

## 2023-10-29 ENCOUNTER — Other Ambulatory Visit: Payer: Medicare PPO

## 2023-10-29 ENCOUNTER — Other Ambulatory Visit: Payer: Self-pay

## 2023-10-29 ENCOUNTER — Ambulatory Visit: Payer: Medicare PPO | Admitting: Hematology

## 2023-10-29 DIAGNOSIS — C259 Malignant neoplasm of pancreas, unspecified: Secondary | ICD-10-CM | POA: Diagnosis not present

## 2023-10-29 MED ORDER — TRIAMCINOLONE ACETONIDE 0.1 % EX CREA: TOPICAL_CREAM | CUTANEOUS | 1 refills | Status: DC

## 2023-10-29 NOTE — Telephone Encounter (Signed)
   Notes to clinic: Refill request sent to [facility]- does not fill Rx for these providers. Sent to PCP for review of request  Requested Prescriptions  Pending Prescriptions Disp Refills   triamcinolone  cream (KENALOG ) 0.1 % [Pharmacy Med Name: TRIAMCINOLONE  0.1% CREAM] 30 g     Sig: APPLY TO AFFECTED AREA TWICE A DAY     There is no refill protocol information for this order       Requested Prescriptions  Pending Prescriptions Disp Refills   triamcinolone  cream (KENALOG ) 0.1 % [Pharmacy Med Name: TRIAMCINOLONE  0.1% CREAM] 30 g     Sig: APPLY TO AFFECTED AREA TWICE A DAY     There is no refill protocol information for this order

## 2023-10-30 ENCOUNTER — Other Ambulatory Visit (HOSPITAL_COMMUNITY): Payer: Medicare PPO

## 2023-10-30 ENCOUNTER — Ambulatory Visit (HOSPITAL_COMMUNITY): Payer: Medicare PPO

## 2023-10-30 ENCOUNTER — Ambulatory Visit: Payer: Self-pay | Admitting: Internal Medicine

## 2023-10-31 ENCOUNTER — Ambulatory Visit: Payer: Medicare PPO | Admitting: Hematology

## 2023-10-31 ENCOUNTER — Other Ambulatory Visit: Payer: Medicare PPO

## 2023-11-01 ENCOUNTER — Ambulatory Visit (HOSPITAL_COMMUNITY)
Admission: RE | Admit: 2023-11-01 | Discharge: 2023-11-01 | Disposition: A | Discharge status: Home / Self Care | Payer: Medicare PPO | Source: Ambulatory Visit | Attending: Hematology

## 2023-11-01 ENCOUNTER — Other Ambulatory Visit: Payer: Self-pay

## 2023-11-01 ENCOUNTER — Ambulatory Visit: Payer: Medicare PPO

## 2023-11-01 DIAGNOSIS — C252 Malignant neoplasm of tail of pancreas: Secondary | ICD-10-CM | POA: Insufficient documentation

## 2023-11-01 DIAGNOSIS — C7889 Secondary malignant neoplasm of other digestive organs: Secondary | ICD-10-CM | POA: Diagnosis not present

## 2023-11-01 LAB — GLUCOSE, CAPILLARY: Glucose-Capillary: 245 mg/dL — ABNORMAL HIGH (ref 70–99)

## 2023-11-01 MED ORDER — FLUDEOXYGLUCOSE F - 18 (FDG) INJECTION
7.3000 | Freq: Once | INTRAVENOUS | Status: AC
  Administered 2023-11-01: 7.29 via INTRAVENOUS

## 2023-11-04 ENCOUNTER — Other Ambulatory Visit: Payer: Medicare PPO

## 2023-11-06 ENCOUNTER — Ambulatory Visit: Payer: Medicare PPO | Admitting: Internal Medicine

## 2023-11-06 ENCOUNTER — Encounter: Payer: Self-pay | Admitting: Internal Medicine

## 2023-11-06 VITALS — BP 100/70 | HR 119 | Temp 97.6°F | Ht 69.0 in | Wt 157.0 lb

## 2023-11-06 DIAGNOSIS — E785 Hyperlipidemia, unspecified: Secondary | ICD-10-CM | POA: Diagnosis not present

## 2023-11-06 DIAGNOSIS — E1169 Type 2 diabetes mellitus with other specified complication: Secondary | ICD-10-CM

## 2023-11-06 DIAGNOSIS — Z79899 Other long term (current) drug therapy: Secondary | ICD-10-CM

## 2023-11-06 DIAGNOSIS — E890 Postprocedural hypothyroidism: Secondary | ICD-10-CM

## 2023-11-06 DIAGNOSIS — C252 Malignant neoplasm of tail of pancreas: Secondary | ICD-10-CM | POA: Diagnosis not present

## 2023-11-06 DIAGNOSIS — R5383 Other fatigue: Secondary | ICD-10-CM

## 2023-11-06 DIAGNOSIS — R630 Anorexia: Secondary | ICD-10-CM

## 2023-11-06 DIAGNOSIS — E1110 Type 2 diabetes mellitus with ketoacidosis without coma: Secondary | ICD-10-CM

## 2023-11-06 MED ORDER — HYDROCODONE-ACETAMINOPHEN 5-325 MG PO TABS: 1.0000 | ORAL_TABLET | Freq: Four times a day (QID) | ORAL | 0 refills | Status: DC | PRN

## 2023-11-06 MED ORDER — CYANOCOBALAMIN 1000 MCG/ML IJ SOLN
1000.0000 ug | Freq: Once | INTRAMUSCULAR | Status: AC
Start: 2023-11-06 — End: 2023-11-06
  Administered 2023-11-06: 1000 ug via INTRAMUSCULAR

## 2023-11-06 MED ORDER — MIRTAZAPINE 30 MG PO TABS: 15.0000 mg | ORAL_TABLET | Freq: Every day | ORAL | 2 refills | Status: DC

## 2023-11-06 NOTE — Progress Notes (Unsigned)
 I,[name], CMA,acting as a Neurosurgeon for [name], MD.,have documented all relevant documentation on the behalf of [name], MD,as directed by  [name], MD while in the presence of [name], MD.  Subjective:  Patient ID: Felicia Garrett , female    DOB: [birth year] , 68 y.o.   MRN: [id number]  Chief Complaint  Patient presents with   Fatigue    HPI  Patient presents today complaining of fatigue, weakness & poor appetite. She states she has been experiencing these symptoms for a few weeks. She initially felt better after leaving the hospital; however, her sx quickly returned. She denies having any fever/chills.  She was started on mirtazapine by Oncology, dose was increased by me during her hospital visit. She states this has not helped improve her appetite. She wants to increase the dose.    She does admit that she has missed several doses of her thyroid meds, at least two weeks worth.  She is aware this is likely contributing to her fatigue.  She admits she has recently been doubling up on her dose to catch up, hoping this will help her feel better.    BP readings: Sitting: 80/62 Standing: 90/60      Past Medical History:  Diagnosis Date   Anemia    Arthritis    Atherosclerosis of aorta (HCC)    Depression    Diabetes mellitus    Hyperlipidemia    Thyroid nodule      Family History  Problem Relation Age of Onset   Hypertension Mother    Hyperlipidemia Mother    Hyperthyroidism Mother    Diabetes Father    Thyroid disease Sister    Hyperlipidemia Brother    Hypertension Brother    Diabetes Brother      Current Outpatient Medications:    atorvastatin (LIPITOR) 40 MG tablet, TAKE 1 TABLET BY MOUTH EVERY DAY (Patient taking differently: Take 40 mg by mouth every evening.), Disp: 90 tablet, Rfl: 1   benzonatate (TESSALON) 200 MG capsule, Take 1 capsule (200 mg total) by mouth 3 (three) times daily as needed for cough., Disp: 60 capsule,  Rfl: 0   Blood Glucose Monitoring Suppl DEVI, 1 each by Does not apply route 3 (three) times daily. May dispense any manufacturer covered by patient's insurance., Disp: 1 each, Rfl: 0   Continuous Glucose Sensor (FREESTYLE LIBRE 3 SENSOR) MISC, Place 1 sensor on the skin every 14 days. Use to check glucose continuously, Disp: 2 each, Rfl: 6   DRYSOL 20 % external solution, Apply topically daily as needed., Disp: 35 mL, Rfl: 1   Glucose Blood (BLOOD GLUCOSE TEST STRIPS) STRP, 1 each by Does not apply route 3 (three) times daily. Use as directed to check blood sugar. May dispense any manufacturer covered by patient's insurance and fits patient's device., Disp: 100 strip, Rfl: 0   HYDROcodone-acetaminophen (NORCO/VICODIN) 5-325 MG tablet, Take 1 tablet by mouth every 6 (six) hours as needed for moderate pain (pain score 4-6)., Disp: 30 tablet, Rfl: 0   insulin aspart (NOVOLOG) 100 UNIT/ML FlexPen, Inject 0-6 Units into the skin 3 (three) times daily with meals. Check Blood Glucose (BG) and inject per scale: BG <150= 0 unit; BG 150-200= 1 unit; BG 201-250= 2 unit; BG 251-300= 3 unit; BG 301-350= 4 unit; BG 351-400= 5 unit; BG >400= 6 unit and Call Primary Care., Disp: 15 mL, Rfl: 0   insulin glargine (LANTUS) 100 UNIT/ML Solostar Pen, Inject 13  Units into the skin at bedtime. May substitute as needed per insurance., Disp: 15 mL, Rfl: 2   Insulin Pen Needle (PEN NEEDLES) 31G X 5 MM MISC, 1 each by Does not apply route 3 (three) times daily. May dispense any manufacturer covered by patient's insurance., Disp: 100 each, Rfl: 0   Iron-FA-B Cmp-C-Biot-Probiotic (FUSION PLUS) CAPS, TAKE 1 CAPSULE BY MOUTH ONCE DAILY BETWEEN MEALS (Patient taking differently: Take 1 capsule by mouth 2 (two) times a week.), Disp: 90 capsule, Rfl: 1   Lancet Device MISC, 1 each by Does not apply route 3 (three) times daily. May dispense any manufacturer covered by patient's insurance., Disp: 1 each, Rfl: 0   Lancets MISC, 1 each by  Does not apply route 3 (three) times daily. Use as directed to check blood sugar. May dispense any manufacturer covered by patient's insurance and fits patient's device., Disp: 100 each, Rfl: 0   levothyroxine (SYNTHROID) 137 MCG tablet, Take 1 tablet (137 mcg total) by mouth daily before breakfast., Disp: 90 tablet, Rfl: 2   methocarbamol (ROBAXIN) 750 MG tablet, TAKE 1 TABLET BY MOUTH 3 TIMES DAILY. (Patient taking differently: Take 750 mg by mouth every 6 (six) hours as needed.), Disp: 40 tablet, Rfl: 1   midodrine (PROAMATINE) 2.5 MG tablet, Take 1 tablet (2.5 mg total) by mouth 2 (two) times daily with a meal., Disp: 60 tablet, Rfl: 0   mirtazapine (REMERON) 30 MG tablet, Take 0.5 tablets (15 mg total) by mouth at bedtime., Disp: 30 tablet, Rfl: 2   ondansetron (ZOFRAN) 4 MG tablet, Take 1 tablet (4 mg total) by mouth every 8 (eight) hours as needed for nausea or vomiting., Disp: 30 tablet, Rfl: 1   ondansetron (ZOFRAN) 8 MG tablet, Take 1 tablet (8 mg total) by mouth every 8 (eight) hours as needed for nausea or vomiting. Start on the third day after chemotherapy, Disp: 30 tablet, Rfl: 1   sodium fluoride (PREVIDENT 5000 PLUS) 1.1 % CREA dental cream, Place 1 Application onto teeth 2 (two) times a week., Disp: , Rfl:    triamcinolone cream (KENALOG) 0.1 %, APPLY TO AFFECTED AREA TWICE DAILY AS NEEDED, Disp: 45 g, Rfl: 1   buPROPion (WELLBUTRIN XL) 150 MG 24 hr tablet, TAKE 1 TABLET BY MOUTH EVERY DAY (Patient not taking: Reported on 11/06/2023), Disp: 90 tablet, Rfl: 2   lidocaine-prilocaine (EMLA) cream, Apply to affected area once (Patient not taking: Reported on 11/06/2023), Disp: 30 g, Rfl: 3   prochlorperazine (COMPAZINE) 10 MG tablet, Take 1 tablet (10 mg total) by mouth every 6 (six) hours as needed for nausea or vomiting (Nausea or vomiting). (Patient not taking: Reported on 11/06/2023), Disp: 30 tablet, Rfl: 1   Allergies  Allergen Reactions   Penicillins Shortness Of Breath    Chest pain      Review of Systems  Constitutional: Negative.   Respiratory: Negative.    Cardiovascular: Negative.   Gastrointestinal: Negative.   Neurological: Negative.   Psychiatric/Behavioral: Negative.       Today's Vitals   11/06/23 1506  BP: 100/70  Pulse: (!) 119  Temp: 97.6 F (36.4 C)  SpO2: 98%  Weight: 157 lb (71.2 kg)  Height: 5\' 9"  (1.753 m)   Body mass index is 23.18 kg/m.  Wt Readings from Last 3 Encounters:  11/06/23 157 lb (71.2 kg)  10/17/23 146 lb 3.2 oz (66.3 kg)  10/09/23 136 lb (61.7 kg)     Objective:  Physical Exam Vitals and nursing note reviewed.  Constitutional:      Appearance: Normal appearance. She is ill-appearing.  HENT:     Head: Normocephalic and atraumatic.  Eyes:     Extraocular Movements: Extraocular movements intact.  Cardiovascular:     Rate and Rhythm: Normal rate and regular rhythm.     Heart sounds: Normal heart sounds.  Pulmonary:     Effort: Pulmonary effort is normal.     Breath sounds: Normal breath sounds.  Musculoskeletal:     Cervical back: Normal range of motion.  Skin:    General: Skin is warm.  Neurological:     General: No focal deficit present.     Mental Status: She is alert.  Psychiatric:        Mood and Affect: Mood normal.        Behavior: Behavior normal.         Assessment And Plan:  Other fatigue Assessment & Plan: Her sx are multifactorial - she admits non-compliance with thyroid meds, encouraged to take daily upon awakening, she is dehydrated given tachycardia and hypotension, and she is not getting proper nourishment. She does not wish to go to ER. She is encouraged to incorporate more salty foods into her diet, bp meds have already been discontinued.  I wonder if she is taking other meds as prescribed. She would likely benefit from Palliative evaluation. She states she is not able to eat because she gets queasy, will need to go to ER if sx persist.   Orders: -     CBC -      Cyanocobalamin  Dyslipidemia associated with type 2 diabetes mellitus (HCC) -     Microalbumin / creatinine urine ratio  Decreased appetite Assessment & Plan: She will increase to mirtazapine to 30mg  nightly, once she finishes her current supply.     Malignant neoplasm of tail of pancreas (HCC) Assessment & Plan: She complains of worsening, persistent abdominal pain. Tramadol was ineffective. She would like something stronger. Agrees to try Vicodin prn. She is encouraged to inform [facility] of her persistent symptoms.    Postsurgical hypothyroidism Assessment & Plan: TSH was elevated in January. She was given sample of Synthroid to take daily x 7 days, then resume daily.   Drug therapy -     Vitamin B12  Other orders -     Mirtazapine; Take 0.5 tablets (15 mg total) by mouth at bedtime.  Dispense: 30 tablet; Refill: 2 -     HYDROcodone-Acetaminophen; Take 1 tablet by mouth every 6 (six) hours as needed for moderate pain (pain score 4-6).  Dispense: 30 tablet; Refill: 0     Return if symptoms worsen or fail to improve.  Patient was given opportunity to ask questions. Patient verbalized understanding of the plan and was able to repeat key elements of the plan. All questions were answered to their satisfaction.    I, [name], MD, have reviewed all documentation for this visit. The documentation on 11/07/23 for the exam, diagnosis, procedures, and orders are all accurate and complete.   IF YOU HAVE BEEN REFERRED TO A SPECIALIST, IT MAY TAKE 1-2 WEEKS TO SCHEDULE/PROCESS THE REFERRAL. IF YOU HAVE NOT HEARD FROM US/SPECIALIST IN TWO WEEKS, PLEASE GIVE [place] A CALL AT [phone].   THE PATIENT IS ENCOURAGED TO PRACTICE SOCIAL DISTANCING DUE TO THE COVID-19 PANDEMIC.

## 2023-11-06 NOTE — Patient Instructions (Signed)
Fatigue If you have fatigue, you feel tired all the time and have a lack of energy or a lack of motivation. Fatigue may make it difficult to start or complete tasks because of exhaustion. Occasional or mild fatigue is often a normal response to activity or life. However, long-term (chronic) or extreme fatigue may be a symptom of a medical condition such as: Depression. Not having enough red blood cells or hemoglobin in the blood (anemia). A problem with a small gland located in the lower front part of the neck (thyroid disorder). Rheumatologic conditions. These are problems related to the body's defense system (immune system). Infections, especially certain viral infections. Fatigue can also lead to negative health outcomes over time. Follow these instructions at home: Medicines Take over-the-counter and prescription medicines only as told by your health care provider. Take a multivitamin if told by your health care provider. Do not use herbal or dietary supplements unless they are approved by your health care provider. Eating and drinking  Avoid heavy meals in the evening. Eat a well-balanced diet, which includes lean proteins, whole grains, plenty of fruits and vegetables, and low-fat dairy products. Avoid eating or drinking too many products with caffeine in them. Avoid alcohol. Drink enough fluid to keep your urine pale yellow. Activity  Exercise regularly, as told by your health care provider. Use or practice techniques to help you relax, such as yoga, tai chi, meditation, or massage therapy. Lifestyle Change situations that cause you stress. Try to keep your work and personal schedules in balance. Do not use recreational or illegal drugs. General instructions Monitor your fatigue for any changes. Go to bed and get up at the same time every day. Avoid fatigue by pacing yourself during the day and getting enough sleep at night. Maintain a healthy weight. Contact a health care  provider if: Your fatigue does not get better. You have a fever. You suddenly lose or gain weight. You have headaches. You have trouble falling asleep or sleeping through the night. You feel angry, guilty, anxious, or sad. You have swelling in your legs or another part of your body. Get help right away if: You feel confused, feel like you might faint, or faint. Your vision is blurry or you have a severe headache. You have severe pain in your abdomen, your back, or the area between your waist and hips (pelvis). You have chest pain, shortness of breath, or an irregular or fast heartbeat. You are unable to urinate, or you urinate less than normal. You have abnormal bleeding from the rectum, nose, lungs, nipples, or, if you are female, the vagina. You vomit blood. You have thoughts about hurting yourself or others. These symptoms may be an emergency. Get help right away. Call 911. Do not wait to see if the symptoms will go away. Do not drive yourself to the hospital. Get help right away if you feel like you may hurt yourself or others, or have thoughts about taking your own life. Go to your nearest emergency room or: Call 911. Call the National Suicide Prevention Lifeline at (262)721-8699 or 988. This is open 24 hours a day. Text the Crisis Text Line at 8450584327. Summary If you have fatigue, you feel tired all the time and have a lack of energy or a lack of motivation. Fatigue may make it difficult to start or complete tasks because of exhaustion. Long-term (chronic) or extreme fatigue may be a symptom of a medical condition. Exercise regularly, as told by your health care provider.  Change situations that cause you stress. Try to keep your work and personal schedules in balance. This information is not intended to replace advice given to you by your health care provider. Make sure you discuss any questions you have with your health care provider. Document Revised: 07/03/2021 Document  Reviewed: 07/03/2021 Elsevier Patient Education  2024 [employer].

## 2023-11-07 DIAGNOSIS — R630 Anorexia: Secondary | ICD-10-CM | POA: Insufficient documentation

## 2023-11-07 LAB — CBC
Hematocrit: 37.5 % (ref 34.0–46.6)
Hemoglobin: 10.6 g/dL — ABNORMAL LOW (ref 11.1–15.9)
MCH: 20.5 pg — ABNORMAL LOW (ref 26.6–33.0)
MCHC: 28.3 g/dL — ABNORMAL LOW (ref 31.5–35.7)
MCV: 73 fL — ABNORMAL LOW (ref 79–97)
Platelets: 305 10*3/uL (ref 150–450)
RBC: 5.17 x10E6/uL (ref 3.77–5.28)
RDW: 17.6 % — ABNORMAL HIGH (ref 11.7–15.4)
WBC: 3.9 10*3/uL (ref 3.4–10.8)

## 2023-11-07 LAB — VITAMIN B12: Vitamin B-12: 1031 pg/mL (ref 232–1245)

## 2023-11-07 NOTE — Assessment & Plan Note (Signed)
TSH was elevated in January. She was given sample of Synthroid to take daily x 7 days, then resume daily.

## 2023-11-07 NOTE — Assessment & Plan Note (Signed)
She will increase to mirtazapine to 30mg  nightly, once she finishes her current supply.

## 2023-11-07 NOTE — Assessment & Plan Note (Signed)
Her sx are multifactorial - she admits non-compliance with thyroid meds, encouraged to take daily upon awakening, she is dehydrated given tachycardia and hypotension, and she is not getting proper nourishment. She does not wish to go to ER. She is encouraged to incorporate more salty foods into her diet, bp meds have already been discontinued.  I wonder if she is taking other meds as prescribed. She would likely benefit from Palliative evaluation. She states she is not able to eat because she gets queasy, will need to go to ER if sx persist.

## 2023-11-07 NOTE — Assessment & Plan Note (Signed)
She complains of worsening, persistent abdominal pain. Tramadol was ineffective. She would like something stronger. Agrees to try Vicodin prn. She is encouraged to inform [facility] of her persistent symptoms.

## 2023-11-08 ENCOUNTER — Encounter (HOSPITAL_COMMUNITY): Payer: Self-pay

## 2023-11-11 NOTE — Progress Notes (Signed)
 Nutrition  Patient was no show to nutrition appointment today. Per chart patient is planning to receive treatment at [facility].   Please refer patient to RD if plans change and patient returns to [facility].   Felicia Garrett, RD, LDN Registered Dietitian [phone]

## 2023-11-12 ENCOUNTER — Ambulatory Visit: Payer: Medicare PPO

## 2023-11-12 ENCOUNTER — Other Ambulatory Visit: Payer: Medicare PPO

## 2023-11-12 ENCOUNTER — Ambulatory Visit: Payer: Medicare PPO | Admitting: Nurse Practitioner

## 2023-11-20 DIAGNOSIS — C252 Malignant neoplasm of tail of pancreas: Secondary | ICD-10-CM | POA: Diagnosis not present

## 2023-11-20 DIAGNOSIS — C786 Secondary malignant neoplasm of retroperitoneum and peritoneum: Secondary | ICD-10-CM | POA: Diagnosis not present

## 2023-11-20 DIAGNOSIS — I951 Orthostatic hypotension: Secondary | ICD-10-CM | POA: Diagnosis not present

## 2023-11-20 DIAGNOSIS — Z5111 Encounter for antineoplastic chemotherapy: Secondary | ICD-10-CM | POA: Diagnosis not present

## 2023-11-20 DIAGNOSIS — Z79899 Other long term (current) drug therapy: Secondary | ICD-10-CM | POA: Diagnosis not present

## 2023-11-20 DIAGNOSIS — E119 Type 2 diabetes mellitus without complications: Secondary | ICD-10-CM | POA: Diagnosis not present

## 2023-11-20 DIAGNOSIS — I472 Ventricular tachycardia, unspecified: Secondary | ICD-10-CM | POA: Diagnosis not present

## 2023-11-25 DIAGNOSIS — C252 Malignant neoplasm of tail of pancreas: Secondary | ICD-10-CM | POA: Diagnosis not present

## 2023-11-25 DIAGNOSIS — T451X5A Adverse effect of antineoplastic and immunosuppressive drugs, initial encounter: Secondary | ICD-10-CM | POA: Diagnosis not present

## 2023-11-25 DIAGNOSIS — R1084 Generalized abdominal pain: Secondary | ICD-10-CM | POA: Diagnosis not present

## 2023-11-25 DIAGNOSIS — R195 Other fecal abnormalities: Secondary | ICD-10-CM | POA: Diagnosis not present

## 2023-11-25 DIAGNOSIS — R112 Nausea with vomiting, unspecified: Secondary | ICD-10-CM | POA: Diagnosis not present

## 2023-11-25 DIAGNOSIS — K5901 Slow transit constipation: Secondary | ICD-10-CM | POA: Diagnosis not present

## 2023-11-25 DIAGNOSIS — R110 Nausea: Secondary | ICD-10-CM | POA: Diagnosis not present

## 2023-11-26 ENCOUNTER — Ambulatory Visit: Payer: Medicare PPO | Admitting: Hematology

## 2023-11-26 ENCOUNTER — Ambulatory Visit: Payer: Medicare PPO

## 2023-11-26 ENCOUNTER — Other Ambulatory Visit: Payer: Medicare PPO

## 2023-11-29 ENCOUNTER — Other Ambulatory Visit: Payer: Self-pay | Admitting: Internal Medicine

## 2023-12-04 DIAGNOSIS — E119 Type 2 diabetes mellitus without complications: Secondary | ICD-10-CM | POA: Diagnosis not present

## 2023-12-04 DIAGNOSIS — I951 Orthostatic hypotension: Secondary | ICD-10-CM | POA: Diagnosis not present

## 2023-12-04 DIAGNOSIS — Z79899 Other long term (current) drug therapy: Secondary | ICD-10-CM | POA: Diagnosis not present

## 2023-12-04 DIAGNOSIS — C252 Malignant neoplasm of tail of pancreas: Secondary | ICD-10-CM | POA: Diagnosis not present

## 2023-12-04 DIAGNOSIS — Z5111 Encounter for antineoplastic chemotherapy: Secondary | ICD-10-CM | POA: Diagnosis not present

## 2023-12-04 DIAGNOSIS — C786 Secondary malignant neoplasm of retroperitoneum and peritoneum: Secondary | ICD-10-CM | POA: Diagnosis not present

## 2023-12-04 DIAGNOSIS — I472 Ventricular tachycardia, unspecified: Secondary | ICD-10-CM | POA: Diagnosis not present

## 2023-12-05 DIAGNOSIS — Z794 Long term (current) use of insulin: Secondary | ICD-10-CM | POA: Diagnosis not present

## 2023-12-05 DIAGNOSIS — E1165 Type 2 diabetes mellitus with hyperglycemia: Secondary | ICD-10-CM | POA: Diagnosis not present

## 2023-12-10 ENCOUNTER — Other Ambulatory Visit: Payer: Medicare PPO

## 2023-12-10 ENCOUNTER — Ambulatory Visit: Payer: Medicare PPO | Admitting: Nurse Practitioner

## 2023-12-10 ENCOUNTER — Ambulatory Visit: Payer: Medicare PPO

## 2023-12-13 ENCOUNTER — Telehealth: Payer: Self-pay

## 2023-12-13 NOTE — Telephone Encounter (Signed)
 Patient was identified as falling into the [place] Measure - Diabetes.   Patient was: Appointment scheduled for lab or office visit for A1c.

## 2023-12-14 ENCOUNTER — Other Ambulatory Visit: Payer: Self-pay | Admitting: Internal Medicine

## 2023-12-18 DIAGNOSIS — R634 Abnormal weight loss: Secondary | ICD-10-CM | POA: Diagnosis not present

## 2023-12-18 DIAGNOSIS — Z681 Body mass index (BMI) 19 or less, adult: Secondary | ICD-10-CM | POA: Diagnosis not present

## 2023-12-18 DIAGNOSIS — Z5111 Encounter for antineoplastic chemotherapy: Secondary | ICD-10-CM | POA: Diagnosis not present

## 2023-12-18 DIAGNOSIS — E119 Type 2 diabetes mellitus without complications: Secondary | ICD-10-CM | POA: Diagnosis not present

## 2023-12-18 DIAGNOSIS — Z7901 Long term (current) use of anticoagulants: Secondary | ICD-10-CM | POA: Diagnosis not present

## 2023-12-18 DIAGNOSIS — I951 Orthostatic hypotension: Secondary | ICD-10-CM | POA: Diagnosis not present

## 2023-12-18 DIAGNOSIS — C252 Malignant neoplasm of tail of pancreas: Secondary | ICD-10-CM | POA: Diagnosis not present

## 2023-12-18 DIAGNOSIS — I472 Ventricular tachycardia, unspecified: Secondary | ICD-10-CM | POA: Diagnosis not present

## 2023-12-23 DIAGNOSIS — C259 Malignant neoplasm of pancreas, unspecified: Secondary | ICD-10-CM | POA: Diagnosis not present

## 2023-12-23 DIAGNOSIS — Z79899 Other long term (current) drug therapy: Secondary | ICD-10-CM | POA: Diagnosis not present

## 2024-01-01 ENCOUNTER — Other Ambulatory Visit: Payer: Self-pay | Admitting: Internal Medicine

## 2024-01-01 DIAGNOSIS — E119 Type 2 diabetes mellitus without complications: Secondary | ICD-10-CM | POA: Diagnosis not present

## 2024-01-01 DIAGNOSIS — Z681 Body mass index (BMI) 19 or less, adult: Secondary | ICD-10-CM | POA: Diagnosis not present

## 2024-01-01 DIAGNOSIS — Z7901 Long term (current) use of anticoagulants: Secondary | ICD-10-CM | POA: Diagnosis not present

## 2024-01-01 DIAGNOSIS — C252 Malignant neoplasm of tail of pancreas: Secondary | ICD-10-CM | POA: Diagnosis not present

## 2024-01-01 DIAGNOSIS — Z79899 Other long term (current) drug therapy: Secondary | ICD-10-CM | POA: Diagnosis not present

## 2024-01-01 DIAGNOSIS — I951 Orthostatic hypotension: Secondary | ICD-10-CM | POA: Diagnosis not present

## 2024-01-01 DIAGNOSIS — Z5111 Encounter for antineoplastic chemotherapy: Secondary | ICD-10-CM | POA: Diagnosis not present

## 2024-01-01 DIAGNOSIS — I472 Ventricular tachycardia, unspecified: Secondary | ICD-10-CM | POA: Diagnosis not present

## 2024-01-01 DIAGNOSIS — R634 Abnormal weight loss: Secondary | ICD-10-CM | POA: Diagnosis not present

## 2024-01-03 DIAGNOSIS — T402X5A Adverse effect of other opioids, initial encounter: Secondary | ICD-10-CM | POA: Diagnosis not present

## 2024-01-03 DIAGNOSIS — K5903 Drug induced constipation: Secondary | ICD-10-CM | POA: Diagnosis not present

## 2024-01-03 DIAGNOSIS — C259 Malignant neoplasm of pancreas, unspecified: Secondary | ICD-10-CM | POA: Diagnosis not present

## 2024-01-03 DIAGNOSIS — R5381 Other malaise: Secondary | ICD-10-CM | POA: Diagnosis not present

## 2024-01-03 DIAGNOSIS — Z515 Encounter for palliative care: Secondary | ICD-10-CM | POA: Diagnosis not present

## 2024-01-03 DIAGNOSIS — G893 Neoplasm related pain (acute) (chronic): Secondary | ICD-10-CM | POA: Diagnosis not present

## 2024-01-03 DIAGNOSIS — L89159 Pressure ulcer of sacral region, unspecified stage: Secondary | ICD-10-CM | POA: Diagnosis not present

## 2024-01-05 ENCOUNTER — Emergency Department (HOSPITAL_COMMUNITY)

## 2024-01-05 ENCOUNTER — Inpatient Hospital Stay (HOSPITAL_COMMUNITY)
Admission: EM | Admit: 2024-01-05 | Discharge: 2024-01-10 | DRG: 436 | Disposition: A | Discharge status: Hospice | Attending: Internal Medicine | Admitting: Internal Medicine

## 2024-01-05 ENCOUNTER — Encounter (HOSPITAL_COMMUNITY): Payer: Self-pay

## 2024-01-05 ENCOUNTER — Other Ambulatory Visit: Payer: Self-pay

## 2024-01-05 DIAGNOSIS — G893 Neoplasm related pain (acute) (chronic): Secondary | ICD-10-CM | POA: Diagnosis present

## 2024-01-05 DIAGNOSIS — Z79890 Hormone replacement therapy (postmenopausal): Secondary | ICD-10-CM

## 2024-01-05 DIAGNOSIS — Z8249 Family history of ischemic heart disease and other diseases of the circulatory system: Secondary | ICD-10-CM

## 2024-01-05 DIAGNOSIS — Z66 Do not resuscitate: Secondary | ICD-10-CM | POA: Diagnosis present

## 2024-01-05 DIAGNOSIS — Z7901 Long term (current) use of anticoagulants: Secondary | ICD-10-CM

## 2024-01-05 DIAGNOSIS — K8689 Other specified diseases of pancreas: Secondary | ICD-10-CM | POA: Diagnosis not present

## 2024-01-05 DIAGNOSIS — E785 Hyperlipidemia, unspecified: Secondary | ICD-10-CM | POA: Diagnosis present

## 2024-01-05 DIAGNOSIS — R739 Hyperglycemia, unspecified: Secondary | ICD-10-CM | POA: Diagnosis not present

## 2024-01-05 DIAGNOSIS — R64 Cachexia: Secondary | ICD-10-CM | POA: Diagnosis present

## 2024-01-05 DIAGNOSIS — R627 Adult failure to thrive: Secondary | ICD-10-CM | POA: Diagnosis present

## 2024-01-05 DIAGNOSIS — E860 Dehydration: Secondary | ICD-10-CM | POA: Diagnosis present

## 2024-01-05 DIAGNOSIS — R531 Weakness: Secondary | ICD-10-CM | POA: Diagnosis not present

## 2024-01-05 DIAGNOSIS — Z79899 Other long term (current) drug therapy: Secondary | ICD-10-CM

## 2024-01-05 DIAGNOSIS — C799 Secondary malignant neoplasm of unspecified site: Secondary | ICD-10-CM | POA: Diagnosis present

## 2024-01-05 DIAGNOSIS — F32A Depression, unspecified: Secondary | ICD-10-CM | POA: Diagnosis present

## 2024-01-05 DIAGNOSIS — E890 Postprocedural hypothyroidism: Secondary | ICD-10-CM | POA: Diagnosis present

## 2024-01-05 DIAGNOSIS — Z681 Body mass index (BMI) 19 or less, adult: Secondary | ICD-10-CM | POA: Diagnosis not present

## 2024-01-05 DIAGNOSIS — D509 Iron deficiency anemia, unspecified: Secondary | ICD-10-CM | POA: Diagnosis present

## 2024-01-05 DIAGNOSIS — E1165 Type 2 diabetes mellitus with hyperglycemia: Secondary | ICD-10-CM | POA: Diagnosis present

## 2024-01-05 DIAGNOSIS — Z604 Social exclusion and rejection: Secondary | ICD-10-CM | POA: Diagnosis present

## 2024-01-05 DIAGNOSIS — Z794 Long term (current) use of insulin: Secondary | ICD-10-CM

## 2024-01-05 DIAGNOSIS — Z7401 Bed confinement status: Secondary | ICD-10-CM | POA: Diagnosis not present

## 2024-01-05 DIAGNOSIS — R54 Age-related physical debility: Secondary | ICD-10-CM | POA: Diagnosis present

## 2024-01-05 DIAGNOSIS — Z9889 Other specified postprocedural states: Secondary | ICD-10-CM

## 2024-01-05 DIAGNOSIS — Z8349 Family history of other endocrine, nutritional and metabolic diseases: Secondary | ICD-10-CM

## 2024-01-05 DIAGNOSIS — E871 Hypo-osmolality and hyponatremia: Secondary | ICD-10-CM | POA: Diagnosis present

## 2024-01-05 DIAGNOSIS — G4700 Insomnia, unspecified: Secondary | ICD-10-CM

## 2024-01-05 DIAGNOSIS — Z833 Family history of diabetes mellitus: Secondary | ICD-10-CM

## 2024-01-05 DIAGNOSIS — R630 Anorexia: Secondary | ICD-10-CM | POA: Diagnosis not present

## 2024-01-05 DIAGNOSIS — Z7189 Other specified counseling: Secondary | ICD-10-CM

## 2024-01-05 DIAGNOSIS — I10 Essential (primary) hypertension: Secondary | ICD-10-CM | POA: Diagnosis present

## 2024-01-05 DIAGNOSIS — L2489 Irritant contact dermatitis due to other agents: Secondary | ICD-10-CM | POA: Diagnosis not present

## 2024-01-05 DIAGNOSIS — K7689 Other specified diseases of liver: Secondary | ICD-10-CM | POA: Diagnosis not present

## 2024-01-05 DIAGNOSIS — Z83438 Family history of other disorder of lipoprotein metabolism and other lipidemia: Secondary | ICD-10-CM

## 2024-01-05 DIAGNOSIS — Z9089 Acquired absence of other organs: Secondary | ICD-10-CM

## 2024-01-05 DIAGNOSIS — R109 Unspecified abdominal pain: Secondary | ICD-10-CM | POA: Diagnosis not present

## 2024-01-05 DIAGNOSIS — E876 Hypokalemia: Secondary | ICD-10-CM | POA: Diagnosis present

## 2024-01-05 DIAGNOSIS — Z515 Encounter for palliative care: Secondary | ICD-10-CM

## 2024-01-05 DIAGNOSIS — R188 Other ascites: Secondary | ICD-10-CM | POA: Diagnosis not present

## 2024-01-05 DIAGNOSIS — K5900 Constipation, unspecified: Secondary | ICD-10-CM | POA: Diagnosis present

## 2024-01-05 DIAGNOSIS — C252 Malignant neoplasm of tail of pancreas: Secondary | ICD-10-CM | POA: Diagnosis not present

## 2024-01-05 DIAGNOSIS — E1169 Type 2 diabetes mellitus with other specified complication: Secondary | ICD-10-CM

## 2024-01-05 DIAGNOSIS — Z86718 Personal history of other venous thrombosis and embolism: Secondary | ICD-10-CM | POA: Diagnosis not present

## 2024-01-05 DIAGNOSIS — C779 Secondary and unspecified malignant neoplasm of lymph node, unspecified: Secondary | ICD-10-CM | POA: Diagnosis not present

## 2024-01-05 DIAGNOSIS — Z9221 Personal history of antineoplastic chemotherapy: Secondary | ICD-10-CM

## 2024-01-05 DIAGNOSIS — R1084 Generalized abdominal pain: Secondary | ICD-10-CM | POA: Diagnosis not present

## 2024-01-05 DIAGNOSIS — C259 Malignant neoplasm of pancreas, unspecified: Principal | ICD-10-CM | POA: Diagnosis present

## 2024-01-05 DIAGNOSIS — Z880 Allergy status to penicillin: Secondary | ICD-10-CM

## 2024-01-05 LAB — CBC WITH DIFFERENTIAL/PLATELET
Abs Immature Granulocytes: 0.02 10*3/uL (ref 0.00–0.07)
Basophils Absolute: 0 10*3/uL (ref 0.0–0.1)
Basophils Relative: 0 %
Eosinophils Absolute: 0 10*3/uL (ref 0.0–0.5)
Eosinophils Relative: 0 %
HCT: 33.8 % — ABNORMAL LOW (ref 36.0–46.0)
Hemoglobin: 9.7 g/dL — ABNORMAL LOW (ref 12.0–15.0)
Immature Granulocytes: 0 %
Lymphocytes Relative: 7 %
Lymphs Abs: 0.3 10*3/uL — ABNORMAL LOW (ref 0.7–4.0)
MCH: 20.6 pg — ABNORMAL LOW (ref 26.0–34.0)
MCHC: 28.7 g/dL — ABNORMAL LOW (ref 30.0–36.0)
MCV: 71.9 fL — ABNORMAL LOW (ref 80.0–100.0)
Monocytes Absolute: 0.2 10*3/uL (ref 0.1–1.0)
Monocytes Relative: 4 %
Neutro Abs: 4.5 10*3/uL (ref 1.7–7.7)
Neutrophils Relative %: 89 %
Platelets: 198 10*3/uL (ref 150–400)
RBC: 4.7 MIL/uL (ref 3.87–5.11)
RDW: 21.4 % — ABNORMAL HIGH (ref 11.5–15.5)
WBC: 5 10*3/uL (ref 4.0–10.5)
nRBC: 0 % (ref 0.0–0.2)

## 2024-01-05 LAB — COMPREHENSIVE METABOLIC PANEL WITH GFR
ALT: 12 U/L (ref 0–44)
AST: 16 U/L (ref 15–41)
Albumin: 2.7 g/dL — ABNORMAL LOW (ref 3.5–5.0)
Alkaline Phosphatase: 61 U/L (ref 38–126)
Anion gap: 12 (ref 5–15)
BUN: 7 mg/dL — ABNORMAL LOW (ref 8–23)
CO2: 25 mmol/L (ref 22–32)
Calcium: 8.4 mg/dL — ABNORMAL LOW (ref 8.9–10.3)
Chloride: 97 mmol/L — ABNORMAL LOW (ref 98–111)
Creatinine, Ser: 0.41 mg/dL — ABNORMAL LOW (ref 0.44–1.00)
GFR, Estimated: >60 mL/min (ref >60)
Glucose, Bld: 256 mg/dL — ABNORMAL HIGH (ref 70–99)
Potassium: 3.3 mmol/L — ABNORMAL LOW (ref 3.5–5.1)
Sodium: 134 mmol/L — ABNORMAL LOW (ref 135–145)
Total Bilirubin: 0.9 mg/dL (ref 0.0–1.2)
Total Protein: 5.7 g/dL — ABNORMAL LOW (ref 6.5–8.1)

## 2024-01-05 LAB — LIPASE, BLOOD: Lipase: 23 U/L (ref 11–51)

## 2024-01-05 LAB — CBG MONITORING, ED: Glucose-Capillary: 286 mg/dL — ABNORMAL HIGH (ref 70–99)

## 2024-01-05 LAB — GLUCOSE, CAPILLARY: Glucose-Capillary: 223 mg/dL — ABNORMAL HIGH (ref 70–99)

## 2024-01-05 MED ORDER — ALBUMIN HUMAN 25 % IV SOLN
12.5000 g | Freq: Three times a day (TID) | INTRAVENOUS | Status: AC
  Administered 2024-01-05 – 2024-01-06 (×3): 12.5 g via INTRAVENOUS
  Filled 2024-01-05 (×3): qty 50

## 2024-01-05 MED ORDER — POTASSIUM CHLORIDE 10 MEQ/100ML IV SOLN
10.0000 meq | Freq: Once | INTRAVENOUS | Status: AC
  Administered 2024-01-05: 10 meq via INTRAVENOUS
  Filled 2024-01-05: qty 100

## 2024-01-05 MED ORDER — MORPHINE SULFATE (PF) 4 MG/ML IV SOLN
4.0000 mg | Freq: Once | INTRAVENOUS | Status: AC
  Administered 2024-01-05: 4 mg via INTRAVENOUS
  Filled 2024-01-05: qty 1

## 2024-01-05 MED ORDER — INSULIN ASPART 100 UNIT/ML IJ SOLN
0.0000 [IU] | Freq: Three times a day (TID) | INTRAMUSCULAR | Status: DC
  Administered 2024-01-06: 2 [IU] via SUBCUTANEOUS
  Administered 2024-01-06 – 2024-01-07 (×3): 3 [IU] via SUBCUTANEOUS
  Administered 2024-01-07: 1 [IU] via SUBCUTANEOUS
  Administered 2024-01-08 (×2): 3 [IU] via SUBCUTANEOUS
  Administered 2024-01-08: 7 [IU] via SUBCUTANEOUS
  Administered 2024-01-09: 2 [IU] via SUBCUTANEOUS
  Administered 2024-01-09: 1 [IU] via SUBCUTANEOUS
  Administered 2024-01-10: 3 [IU] via SUBCUTANEOUS
  Administered 2024-01-10: 2 [IU] via SUBCUTANEOUS
  Filled 2024-01-05: qty 0.09

## 2024-01-05 MED ORDER — POTASSIUM CHLORIDE CRYS ER 20 MEQ PO TBCR
40.0000 meq | EXTENDED_RELEASE_TABLET | Freq: Once | ORAL | Status: DC
  Filled 2024-01-05: qty 2

## 2024-01-05 MED ORDER — SENNA 8.6 MG PO TABS
1.0000 | ORAL_TABLET | Freq: Two times a day (BID) | ORAL | Status: DC
  Administered 2024-01-06 – 2024-01-10 (×9): 8.6 mg via ORAL
  Filled 2024-01-05 (×10): qty 1

## 2024-01-05 MED ORDER — ONDANSETRON HCL 4 MG/2ML IJ SOLN
4.0000 mg | Freq: Four times a day (QID) | INTRAMUSCULAR | Status: DC | PRN
  Administered 2024-01-06: 4 mg via INTRAVENOUS
  Filled 2024-01-05: qty 2

## 2024-01-05 MED ORDER — LEVOTHYROXINE SODIUM 137 MCG PO TABS
137.0000 ug | ORAL_TABLET | Freq: Every day | ORAL | Status: DC
  Administered 2024-01-06 – 2024-01-10 (×5): 137 ug via ORAL
  Filled 2024-01-05 (×5): qty 1

## 2024-01-05 MED ORDER — SODIUM CHLORIDE 0.9 % IV SOLN: INTRAVENOUS | Status: AC

## 2024-01-05 MED ORDER — ACETAMINOPHEN 325 MG PO TABS: 650.0000 mg | ORAL_TABLET | Freq: Four times a day (QID) | ORAL | Status: DC | PRN

## 2024-01-05 MED ORDER — ALBUMIN HUMAN 25 % IV SOLN: 12.5000 g | Freq: Four times a day (QID) | INTRAVENOUS | Status: DC

## 2024-01-05 MED ORDER — POTASSIUM CHLORIDE CRYS ER 20 MEQ PO TBCR
40.0000 meq | EXTENDED_RELEASE_TABLET | Freq: Once | ORAL | Status: AC
  Administered 2024-01-05: 40 meq via ORAL
  Filled 2024-01-05: qty 2

## 2024-01-05 MED ORDER — IOHEXOL 300 MG/ML  SOLN
100.0000 mL | Freq: Once | INTRAMUSCULAR | Status: AC | PRN
  Administered 2024-01-05: 100 mL via INTRAVENOUS

## 2024-01-05 MED ORDER — ACETAMINOPHEN 650 MG RE SUPP: 650.0000 mg | Freq: Four times a day (QID) | RECTAL | Status: DC | PRN

## 2024-01-05 MED ORDER — ONDANSETRON HCL 4 MG PO TABS: 4.0000 mg | ORAL_TABLET | Freq: Four times a day (QID) | ORAL | Status: DC | PRN

## 2024-01-05 MED ORDER — GABAPENTIN 100 MG PO CAPS
100.0000 mg | ORAL_CAPSULE | Freq: Three times a day (TID) | ORAL | Status: DC
  Administered 2024-01-05 – 2024-01-10 (×12): 100 mg via ORAL
  Filled 2024-01-05 (×15): qty 1

## 2024-01-05 MED ORDER — MIDODRINE HCL 5 MG PO TABS
2.5000 mg | ORAL_TABLET | Freq: Two times a day (BID) | ORAL | Status: DC
  Administered 2024-01-06 – 2024-01-07 (×2): 2.5 mg via ORAL
  Filled 2024-01-05 (×7): qty 1

## 2024-01-05 MED ORDER — LACTATED RINGERS IV BOLUS
1000.0000 mL | Freq: Once | INTRAVENOUS | Status: AC
  Administered 2024-01-05: 1000 mL via INTRAVENOUS

## 2024-01-05 MED ORDER — HYDROCODONE-ACETAMINOPHEN 5-325 MG PO TABS
1.0000 | ORAL_TABLET | ORAL | Status: DC | PRN
  Administered 2024-01-06: 2 via ORAL
  Administered 2024-01-06: 1 via ORAL
  Filled 2024-01-05: qty 2
  Filled 2024-01-05: qty 1

## 2024-01-05 MED ORDER — ENOXAPARIN SODIUM 40 MG/0.4ML IJ SOSY
40.0000 mg | PREFILLED_SYRINGE | INTRAMUSCULAR | Status: DC
Start: 2024-01-05 — End: 2024-01-06
  Administered 2024-01-05: 40 mg via SUBCUTANEOUS
  Filled 2024-01-05: qty 0.4

## 2024-01-05 NOTE — ED Provider Notes (Signed)
 Care was taken over from Dr. Nora Beal.  Patient is currently being treated for pancreatic cancer.  She presents with worsening weakness.  She has decreased appetite.  She says that she can barely get around at home.  She had trouble even transferring from the bed here to the CT scanner.  Her labs are grossly nonconcerning.  CT scan shows progression of her cancer with enlarging pancreatic mass and lymphadenopathy.  Given her worsening weakness, I feel it be appropriate to admit her for fluids and ongoing care.  Oncology and palliative care can be consulted while she is in the hospital.  Discussed with the hospitalist, Dr. Renie Carver who will evaluate the pt for admission   Hershel Los, MD 01/05/24 1820

## 2024-01-05 NOTE — ED Provider Notes (Signed)
 [place] EMERGENCY DEPARTMENT AT [facility] Provider Note   CSN: [id number] Arrival date & time: 01/05/24  1206     History  Chief Complaint  Patient presents with   Abdominal Pain   Weakness    Felicia Garrett is a 68 y.o. female.  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  Patient states since Wednesday she has had progressive weakness and she is unable to roll in her bed on her own or get up and walk to the bathroom on her own.  She states she was previously using a walker.  She states that she gets very lightheaded and dizzy when she stands.  She states that she has had a decreased appetite despite taking her appetite stimulants.  She denies any nausea or vomiting.  She states that she is having mid to upper abdominal pain.  She states this is not significantly worsened in the last few days.  She denies any fever.  She states that she has been constipated and has not had a bowel movement in about the last 2 weeks.  Was just started on a bowel regimen yesterday by palliative.  She states that she also has recently been diagnosed with a sacral wound and palliative put in a wound care consult for her but she has not yet established with them.  The history is provided by the patient and a relative.  Abdominal Pain Weakness Associated symptoms: abdominal pain        Home Medications Prior to Admission medications   Medication Sig Start Date End Date Taking? Authorizing Provider  atorvastatin (LIPITOR) 40 MG tablet TAKE 1 TABLET BY MOUTH EVERY DAY Patient taking differently: Take 40 mg by mouth every evening. 08/19/23   [name], MD  benzonatate (TESSALON) 200 MG capsule Take 1 capsule (200 mg total) by mouth 3 (three) times daily as needed for cough. 10/13/23   Rai, Delene Ruffini, MD  Blood Glucose Monitoring Suppl  DEVI 1 each by Does not apply route 3 (three) times daily. May dispense any manufacturer covered by patient's insurance. 10/13/23   Rai, Ripudeep K, MD  buPROPion (WELLBUTRIN XL) 150 MG 24 hr tablet TAKE 1 TABLET BY MOUTH EVERY DAY Patient not taking: Reported on 11/06/2023 08/19/23   [name], MD  Continuous Glucose Sensor (FREESTYLE LIBRE 3 SENSOR) MISC Place 1 sensor on the skin every 14 days. Use to check glucose continuously 10/28/23   [name], MD  DRYSOL 20 % external solution APPLY TO AFFECTED AREA EVERY DAY AS NEEDED 01/01/24   [name], MD  Glucose Blood (BLOOD GLUCOSE TEST STRIPS) STRP 1 each by Does not apply route 3 (three) times daily. Use as directed to check blood sugar. May dispense any manufacturer covered by patient's insurance and fits patient's device. 10/13/23   Rai, Delene Ruffini, MD  HYDROcodone-acetaminophen (NORCO/VICODIN) 5-325 MG tablet Take 1 tablet by mouth every 6 (six) hours as needed for moderate pain (pain score 4-6). 11/06/23   [name], MD  insulin aspart (NOVOLOG) 100 UNIT/ML FlexPen Inject 0-6 Units into the skin 3 (three) times daily with meals. Check Blood Glucose (BG) and inject per scale: BG <150= 0 unit; BG 150-200= 1 unit; BG 201-250= 2 unit; BG 251-300= 3 unit; BG 301-350= 4 unit; BG 351-400= 5 unit; BG >400= 6 unit and Call Primary Care. 10/13/23   Rai, Ripudeep  K, MD  insulin glargine (LANTUS) 100 UNIT/ML Solostar Pen Inject 13 Units into the skin at bedtime. May substitute as needed per insurance. 10/13/23   Rai, Ripudeep K, MD  Insulin Pen Needle (PEN NEEDLES) 31G X 5 MM MISC 1 each by Does not apply route 3 (three) times daily. May dispense any manufacturer covered by patient's insurance. 10/13/23   Rai, Ripudeep K, MD  Iron-FA-B Cmp-C-Biot-Probiotic (FUSION PLUS) CAPS TAKE 1 CAPSULE BY MOUTH ONCE DAILY BETWEEN MEALS Patient taking differently: Take 1 capsule by mouth 2 (two) times a week. 01/14/23   Cleave Curling, MD  Lancet Device MISC 1 each  by Does not apply route 3 (three) times daily. May dispense any manufacturer covered by patient's insurance. 10/13/23   Rai, Hurman Maiden, MD  Lancets MISC 1 each by Does not apply route 3 (three) times daily. Use as directed to check blood sugar. May dispense any manufacturer covered by patient's insurance and fits patient's device. 10/13/23   Rai, Hurman Maiden, MD  levothyroxine (SYNTHROID) 137 MCG tablet TAKE 1 TABLET BY MOUTH DAILY BEFORE BREAKFAST. 12/16/23   Cleave Curling, MD  lidocaine-prilocaine (EMLA) cream Apply to affected area once Patient not taking: Reported on 11/06/2023 10/17/23   Sonja Albright, MD  methocarbamol (ROBAXIN) 750 MG tablet TAKE 1 TABLET BY MOUTH 3 TIMES DAILY. Patient taking differently: Take 750 mg by mouth every 6 (six) hours as needed. 08/08/22   Cleave Curling, MD  midodrine (PROAMATINE) 2.5 MG tablet Take 1 tablet (2.5 mg total) by mouth 2 (two) times daily with a meal. 10/13/23   Rai, Ripudeep K, MD  mirtazapine (REMERON) 30 MG tablet TAKE 0.5 TABLETS (15 MG TOTAL) BY MOUTH AT BEDTIME. 12/02/23 12/01/24  Cleave Curling, MD  ondansetron (ZOFRAN) 4 MG tablet Take 1 tablet (4 mg total) by mouth every 8 (eight) hours as needed for nausea or vomiting. 10/13/23 10/12/24  Rai, Ripudeep K, MD  ondansetron (ZOFRAN) 8 MG tablet Take 1 tablet (8 mg total) by mouth every 8 (eight) hours as needed for nausea or vomiting. Start on the third day after chemotherapy 10/17/23   Sonja Alamo, MD  prochlorperazine (COMPAZINE) 10 MG tablet Take 1 tablet (10 mg total) by mouth every 6 (six) hours as needed for nausea or vomiting (Nausea or vomiting). Patient not taking: Reported on 11/06/2023 10/17/23   Sonja Ismay, MD  sodium fluoride (PREVIDENT 5000 PLUS) 1.1 % CREA dental cream Place 1 Application onto teeth 2 (two) times a week.    [provider]  triamcinolone cream (KENALOG) 0.1 % APPLY TO AFFECTED AREA TWICE DAILY AS NEEDED 10/29/23   Cleave Curling, MD      Allergies    Penicillins    Review  of Systems   Review of Systems  Gastrointestinal:  Positive for abdominal pain.  Neurological:  Positive for weakness.    Physical Exam Updated Vital Signs BP 116/76   Pulse 93   Temp (!) 97.5 F (36.4 C) (Oral)   Resp 16   Ht 5\' 9"  (1.753 m)   Wt 56.7 kg   SpO2 98%   BMI 18.46 kg/m  Physical Exam Vitals and nursing note reviewed.  Constitutional:      Appearance: She is well-developed. She is ill-appearing (chronically).     Comments: Frail and cachectic appearing  HENT:     Head: Normocephalic.     Mouth/Throat:     Mouth: Mucous membranes are moist.     Pharynx: Oropharynx is clear.  Eyes:  Extraocular Movements: Extraocular movements intact.  Cardiovascular:     Rate and Rhythm: Normal rate and regular rhythm.     Heart sounds: Normal heart sounds.  Pulmonary:     Effort: Pulmonary effort is normal.     Breath sounds: Normal breath sounds.  Abdominal:     General: Abdomen is flat.     Palpations: Abdomen is soft.     Tenderness: There is abdominal tenderness in the epigastric area and periumbilical area. There is no guarding or rebound.  Skin:    General: Skin is warm and dry.     Comments: Stage I sacral wound without any erythema, warmth or drainage  Neurological:     General: No focal deficit present.     Mental Status: She is alert and oriented to person, place, and time.  Psychiatric:        Mood and Affect: Mood normal.        Behavior: Behavior normal.     ED Results / Procedures / Treatments   Labs (all labs ordered are listed, but only abnormal results are displayed) Labs Reviewed  COMPREHENSIVE METABOLIC PANEL WITH GFR - Abnormal; Notable for the following components:      Result Value   Sodium 134 (*)    Potassium 3.3 (*)    Chloride 97 (*)    Glucose, Bld 256 (*)    BUN 7 (*)    Creatinine, Ser 0.41 (*)    Calcium 8.4 (*)    Total Protein 5.7 (*)    Albumin 2.7 (*)    All other components within normal limits  CBC WITH  DIFFERENTIAL/PLATELET - Abnormal; Notable for the following components:   Hemoglobin 9.7 (*)    HCT 33.8 (*)    MCV 71.9 (*)    MCH 20.6 (*)    MCHC 28.7 (*)    RDW 21.4 (*)    Lymphs Abs 0.3 (*)    All other components within normal limits  CBG MONITORING, ED - Abnormal; Notable for the following components:   Glucose-Capillary 286 (*)    All other components within normal limits  LIPASE, BLOOD  URINALYSIS, W/ REFLEX TO CULTURE (INFECTION SUSPECTED)    EKG EKG Interpretation Date/Time:  Sunday January 05 2024 14:47:07 EDT Ventricular Rate:  88 PR Interval:  149 QRS Duration:  73 QT Interval:  391 QTC Calculation: 474 R Axis:   13  Text Interpretation: Sinus rhythm Borderline low voltage, extremity leads Posterior infarct, old No significant change since last tracing Confirmed by Celesta Coke ([id number]) on 01/05/2024 3:12:46 PM  Radiology No results found.  Procedures Procedures    Medications Ordered in ED Medications  potassium chloride SA (KLOR-CON M) CR tablet 40 mEq (has no administration in time range)  lactated ringers bolus 1,000 mL (1,000 mLs Intravenous New Bag/Given 01/05/24 1339)  morphine (PF) 4 MG/ML injection 4 mg (4 mg Intravenous Given 01/05/24 1339)  iohexol (OMNIPAQUE) 300 MG/ML solution 100 mL (100 mLs Intravenous Contrast Given 01/05/24 1506)    ED Course/ Medical Decision Making/ A&P Clinical Course as of 01/05/24 1535  Sun Jan 05, 2024  1511 Labs with mild hyperglycemia without DKA, low albumin consistent with poor nutrition. Mildly low potassium will be repleted. [VK]  1534 Patient signed out to Dr. Nolia Baumgartner pending CT read and urine for disposition. 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 The complaint involves an extensive differential diagnosis and also carries with it a high risk of complications and morbidity.    The differential diagnosis includes arrhythmia, anemia, dehydration, electrolyte abnormality, hypo or hyperglycemia, progression of disease, intra-abdominal infection, constipation, obstruction, no focal neurologic deficits making CVA or TIA unlikely  Additional history obtained: Additional history obtained from family Records reviewed Care Everywhere/External Records  ED Course and Reassessment: On patient's arrival she is hemodynamically stable in no acute distress, ill-appearing.  Patient will have EKG, labs and CT imaging performed.  She will be started on fluids and given pain control and will be closely reassessed.  Independent labs interpretation:  The following labs were independently interpreted: mild hypokalemia and hyponatremia, hyperglycemia without DKA  Independent visualization of imaging: - Pending   Amount and/or Complexity of Data Reviewed Labs: ordered. Radiology: ordered.  Risk Prescription drug management.          Final Clinical Impression(s) / ED Diagnoses Final diagnoses:  None    Rx / DC Orders ED Discharge Orders     None         Kingsley, Ein Rijo K, DO 01/05/24 1535

## 2024-01-05 NOTE — H&P (Signed)
 History and Physical    Ellon Marasco  VWU:[id number]  DOB: May 11, 1956  DOA: 01/05/2024 PCP: Cleave Curling, MD   Patient coming from: Home   Chief Complaint: weight loss and weakness  HPI: Felicia Garrett is a 68 y.o. female with medical history of with metastatic pancreatic cancer, DM2 who presents for progressive weight loss, poor appetite, poor urine output and weakness. She is having difficulty ambulating. She has pain in upper abdomen and numbness in her legs. Food tastes bad and she has had to lower her dose of insulin since she is not eating much. She is wanting to talk to palliative care and oncology about other treatment options.   ED Course: K 3.3, sodium 134 CBG 256  Review of Systems:  All other systems reviewed and apart from HPI, are negative.  Past Medical History:  Diagnosis Date   Anemia    Arthritis    Atherosclerosis of aorta (HCC)    Depression    Diabetes mellitus    Hyperlipidemia    Thyroid nodule     Past Surgical History:  Procedure Laterality Date   ESOPHAGOGASTRODUODENOSCOPY (EGD) WITH PROPOFOL N/A 10/11/2023   Procedure: ESOPHAGOGASTRODUODENOSCOPY (EGD) WITH PROPOFOL;  Surgeon: Alvis Jourdain, MD;  Location: [facility] ENDOSCOPY;  Service: Gastroenterology;  Laterality: N/A;   EUS N/A 10/11/2023   Procedure: UPPER ENDOSCOPIC ULTRASOUND (EUS) LINEAR;  Surgeon: Alvis Jourdain, MD;  Location: [facility] ENDOSCOPY;  Service: Gastroenterology;  Laterality: N/A;   FINE NEEDLE ASPIRATION  10/11/2023   Procedure: FINE NEEDLE ASPIRATION (FNA) LINEAR;  Surgeon: Alvis Jourdain, MD;  Location: [facility] ENDOSCOPY;  Service: Gastroenterology;;   LAPAROSCOPIC GASTRIC BANDING  06/14/08   THYROIDECTOMY N/A 10/08/2014   Procedure: NEAR TOTAL THYROIDECTOMY;  Surgeon: Azucena Bollard, MD;  Location: [facility] ORS;  Service: General;  Laterality: N/A;   UTERINE FIBROID EMBOLIZATION  10 yrs ago    Social History:   reports that she has never smoked. She has never used smokeless tobacco. She reports  that she does not drink alcohol and does not use drugs.  Allergies  Allergen Reactions   Penicillins Shortness Of Breath    Chest pain    Family History  Problem Relation Age of Onset   Hypertension Mother    Hyperlipidemia Mother    Hyperthyroidism Mother    Diabetes Father    Thyroid disease Sister    Hyperlipidemia Brother    Hypertension Brother    Diabetes Brother      Prior to Admission medications   Medication Sig Start Date End Date Taking? Authorizing Provider  apixaban (ELIQUIS) 5 MG TABS tablet Take 5 mg by mouth 2 (two) times daily. 12/04/23 12/03/24 Yes [provider]  acetaminophen (TYLENOL) 500 MG tablet Take 1,000 mg by mouth in the morning, at noon, and at bedtime.    [provider]  atorvastatin (LIPITOR) 40 MG tablet TAKE 1 TABLET BY MOUTH EVERY DAY Patient taking differently: Take 40 mg by mouth every evening. 08/19/23   Cleave Curling, MD  benzonatate (TESSALON) 200 MG capsule Take 1 capsule (200 mg total) by mouth 3 (three) times daily as needed for cough. 10/13/23   Rai, Hurman Maiden, MD  Blood Glucose Monitoring Suppl DEVI 1 each by Does not apply route 3 (three) times daily. May dispense any manufacturer covered by patient's insurance. 10/13/23   Rai, Ripudeep K, MD  buPROPion (WELLBUTRIN XL) 150 MG 24 hr tablet TAKE 1 TABLET BY MOUTH EVERY DAY Patient not taking: Reported on 11/06/2023 08/19/23   Elnita Hai,  Jerrold Morgan, MD  Continuous Glucose Sensor (FREESTYLE LIBRE 3 SENSOR) MISC Place 1 sensor on the skin every 14 days. Use to check glucose continuously 10/28/23   Cleave Curling, MD  DRYSOL 20 % external solution APPLY TO AFFECTED AREA EVERY DAY AS NEEDED 01/01/24   Cleave Curling, MD  Glucose Blood (BLOOD GLUCOSE TEST STRIPS) STRP 1 each by Does not apply route 3 (three) times daily. Use as directed to check blood sugar. May dispense any manufacturer covered by patient's insurance and fits patient's device. 10/13/23   Rai, Ripudeep K, MD   HYDROcodone-acetaminophen (NORCO/VICODIN) 5-325 MG tablet Take 1 tablet by mouth every 6 (six) hours as needed for moderate pain (pain score 4-6). 11/06/23   Cleave Curling, MD  insulin aspart (NOVOLOG) 100 UNIT/ML FlexPen Inject 0-6 Units into the skin 3 (three) times daily with meals. Check Blood Glucose (BG) and inject per scale: BG <150= 0 unit; BG 150-200= 1 unit; BG 201-250= 2 unit; BG 251-300= 3 unit; BG 301-350= 4 unit; BG 351-400= 5 unit; BG >400= 6 unit and Call Primary Care. 10/13/23   Rai, Ripudeep K, MD  insulin glargine (LANTUS) 100 UNIT/ML Solostar Pen Inject 13 Units into the skin at bedtime. May substitute as needed per insurance. 10/13/23   Rai, Ripudeep K, MD  Insulin Pen Needle (PEN NEEDLES) 31G X 5 MM MISC 1 each by Does not apply route 3 (three) times daily. May dispense any manufacturer covered by patient's insurance. 10/13/23   Rai, Ripudeep K, MD  Iron-FA-B Cmp-C-Biot-Probiotic (FUSION PLUS) CAPS TAKE 1 CAPSULE BY MOUTH ONCE DAILY BETWEEN MEALS Patient taking differently: Take 1 capsule by mouth 2 (two) times a week. 01/14/23   Cleave Curling, MD  Lancet Device MISC 1 each by Does not apply route 3 (three) times daily. May dispense any manufacturer covered by patient's insurance. 10/13/23   Rai, Hurman Maiden, MD  Lancets MISC 1 each by Does not apply route 3 (three) times daily. Use as directed to check blood sugar. May dispense any manufacturer covered by patient's insurance and fits patient's device. 10/13/23   Rai, Hurman Maiden, MD  levothyroxine (SYNTHROID) 137 MCG tablet TAKE 1 TABLET BY MOUTH DAILY BEFORE BREAKFAST. 12/16/23   Cleave Curling, MD  lidocaine-prilocaine (EMLA) cream Apply to affected area once Patient not taking: Reported on 11/06/2023 10/17/23   Sonja [place], MD  methocarbamol (ROBAXIN) 750 MG tablet TAKE 1 TABLET BY MOUTH 3 TIMES DAILY. Patient taking differently: Take 750 mg by mouth every 6 (six) hours as needed. 08/08/22   Cleave Curling, MD  midodrine (PROAMATINE) 2.5  MG tablet Take 1 tablet (2.5 mg total) by mouth 2 (two) times daily with a meal. 10/13/23   Rai, Ripudeep K, MD  mirtazapine (REMERON) 30 MG tablet TAKE 0.5 TABLETS (15 MG TOTAL) BY MOUTH AT BEDTIME. 12/02/23 12/01/24  Cleave Curling, MD  ondansetron (ZOFRAN) 4 MG tablet Take 1 tablet (4 mg total) by mouth every 8 (eight) hours as needed for nausea or vomiting. 10/13/23 10/12/24  Rai, Ripudeep K, MD  ondansetron (ZOFRAN) 8 MG tablet Take 1 tablet (8 mg total) by mouth every 8 (eight) hours as needed for nausea or vomiting. Start on the third day after chemotherapy 10/17/23   Sonja [place], MD  prochlorperazine (COMPAZINE) 10 MG tablet Take 1 tablet (10 mg total) by mouth every 6 (six) hours as needed for nausea or vomiting (Nausea or vomiting). Patient not taking: Reported on 11/06/2023 10/17/23   Sonja [place], MD  sodium fluoride (PREVIDENT 5000  PLUS) 1.1 % CREA dental cream Place 1 Application onto teeth 2 (two) times a week.    [provider]  triamcinolone cream (KENALOG) 0.1 % APPLY TO AFFECTED AREA TWICE DAILY AS NEEDED 10/29/23   Cleave Curling, MD    Physical Exam: Wt Readings from Last 3 Encounters:  01/05/24 56.7 kg  11/06/23 71.2 kg  10/17/23 66.3 kg   Vitals:   01/05/24 1228 01/05/24 1300 01/05/24 1610 01/05/24 1618  BP: 114/83 116/76 124/83   Pulse: 97 93 90   Resp: 14 16 16    Temp: (!) 97.5 F (36.4 C)   97.7 F (36.5 C)  TempSrc: Oral   Oral  SpO2: 98% 98% 100%   Weight:      Height:          Constitutional:  Calm & comfortable Eyes: PERRLA, lids and conjunctivae normal ENT:  Mucous membranes are moist.  Pharynx clear of exudate   Normal dentition.  Respiratory:  Clear to auscultation bilaterally  Normal respiratory effort.  Cardiovascular:  S1 & S2 heard, regular rate and rhythm No Murmurs Abdomen:  Non distended No tenderness, No masses Bowel sounds normal Extremities:  No clubbing / cyanosis No pedal edema  Skin:  No rashes, lesions or  ulcers Neurologic:  AAO x 3 CN 2-12 grossly intact Sensation intact Strength 5/5 in all 4 extremities Psychiatric:  Normal Mood and affect    Labs on Admission: I have personally reviewed following labs and imaging studies  CBC: Recent Labs  Lab 01/05/24 1330  WBC 5.0  NEUTROABS 4.5  HGB 9.7*  HCT 33.8*  MCV 71.9*  PLT 198   Basic Metabolic Panel: Recent Labs  Lab 01/05/24 1330  NA 134*  K 3.3*  CL 97*  CO2 25  GLUCOSE 256*  BUN 7*  CREATININE 0.41*  CALCIUM 8.4*   GFR: Estimated Creatinine Clearance: 59.4 mL/min (A) (by C-G formula based on SCr of 0.41 mg/dL (L)). Liver Function Tests: Recent Labs  Lab 01/05/24 1330  AST 16  ALT 12  ALKPHOS 61  BILITOT 0.9  PROT 5.7*  ALBUMIN 2.7*   Recent Labs  Lab 01/05/24 1330  LIPASE 23    CBG: Recent Labs  Lab 01/05/24 1448  GLUCAP 286*       Component Value Date/Time   COLORURINE YELLOW 10/08/2023 1141   APPEARANCEUR HAZY (A) 10/08/2023 1141   LABSPEC 1.028 10/08/2023 1141   PHURINE 5.0 10/08/2023 1141   GLUCOSEU >=500 (A) 10/08/2023 1141   HGBUR NEGATIVE 10/08/2023 1141   BILIRUBINUR NEGATIVE 10/08/2023 1141   BILIRUBINUR small (A) 10/06/2023 1507   BILIRUBINUR negative 08/23/2021 1627   KETONESUR 80 (A) 10/08/2023 1141   PROTEINUR 30 (A) 10/08/2023 1141   UROBILINOGEN 0.2 10/06/2023 1507   UROBILINOGEN 0.2 10/23/2008 0448   NITRITE NEGATIVE 10/08/2023 1141   LEUKOCYTESUR MODERATE (A) 10/08/2023 1141   Sepsis Labs: @LABRCNTIP (procalcitonin:4,lacticidven:4) )No results found for this or any previous visit (from the past 240 hours).   Radiological Exams on Admission: CT ABDOMEN PELVIS W CONTRAST Result Date: 01/05/2024 CLINICAL DATA:  Abdominal pain, acute, nonlocalized EXAM: CT ABDOMEN AND PELVIS WITH CONTRAST TECHNIQUE: Multidetector CT imaging of the abdomen and pelvis was performed using the standard protocol following bolus administration of intravenous contrast. RADIATION DOSE  REDUCTION: This exam was performed according to the departmental dose-optimization program which includes automated exposure control, adjustment of the mA and/or kV according to patient size and/or use of iterative reconstruction technique. CONTRAST:  100mL OMNIPAQUE IOHEXOL 300  MG/ML  SOLN COMPARISON:  October 08, 2023 FINDINGS: Lower chest: No focal airspace consolidation or pleural effusion.Fibrolinear scarring in the lung bases. Partially visualized central venous catheter at the cavoatrial junction. Hepatobiliary: No mass. A couple of small cysts in both hepatic lobes measuring up to 2 cm.No radiopaque stones or wall thickening of the gallbladder.No intrahepatic or extrahepatic biliary ductal dilation.While the portal vein is patent, there is severe narrowing at the portal splenic confluence. Pancreas: Increasing size of the infiltrative mass in the upstream pancreatic body and tail, measuring approximately 4.8 x 7.6 cm (previously, 4.1 by 5 cm, by my measurement). Additionally, hypoattenuating, infiltrative soft tissue extends into the downstream pancreatic body and neck and extending inferiorly into the root of the mesentery (for example, axial 30), encasing and severely narrowing the jejunal branches and the downstream SMV. Additionally, the soft tissue extends posteriorly and encases the celiac artery causing moderate to severe narrowing of celiac trifurcation and splenic arteries. Spleen: Normal size. No mass. Adrenals/Urinary Tract: No adrenal masses. Unchanged left renal cyst. No mass. No nephrolithiasis or hydronephrosis. The urinary bladder is distended without focal abnormality. Stomach/Bowel: Similarly positioned gastric lap band. Diffuse gastric submucosal edema and wall thickening. No small bowel wall thickening or inflammation. No small bowel obstruction. The appendix was not visualized. No right lower quadrant or pericecal inflammatory changes to suggest acute appendicitis. Moderate volume fecal  loading throughout the colon. Vascular/Lymphatic: No aortic aneurysm. Scattered aortoiliac atherosclerosis. Centrally necrotic retrocaval lymph node measuring 1.4 cm (axial 35). Reproductive: Age-related atrophy of the uterus and ovaries. No concerning adnexal mass. Calcified uterine fibroids.No free pelvic fluid. Other: Diffuse mesenteric edema. No pneumoperitoneum. Small volume ascites layering in the pelvis. Significant enlargement of a large peritoneal implant in the left upper quadrant, measuring 5.6 x 4.6 cm (previously 2.1 by 1.5 cm), axial 36. Musculoskeletal: No acute fracture or destructive lesion.Multilevel degenerative disc disease of the spine. Diffuse osteopenia. Thoracic DISH. Diffuse anasarca, worsening. Gastric lap band reservoir in the midline ventral abdominal wall. No discontinuity of the catheter tubing. IMPRESSION: 1. Diffuse gastric wall thickening with submucosal edema, which may be due to hypoproteinemia, acute gastritis, or peptic ulcer disease. 2. Significant enlargement of the infiltrative pancreatic mass, measuring 4.8 x 7.6 cm (previously, 4.1 x 5 cm), by my measurement. Increasing size of a left upper peritoneal nodule measuring 5.6 x 4.6 cm (previously, 2.1 by 1.5 cm). 3. Developing metastatic retrocaval lymphadenopathy, measuring 1.4 cm (axial 35). 4. Moderate volume fecal loading throughout the colon, as can be seen in constipation. 5. Diffuse anasarca with small volume ascites layering in the pelvis. Electronically Signed   By: Rance Burrows M.D.   On: 01/05/2024 15:54    EKG: Independently reviewed. NSR  Assessment/Plan Principal Problem:   Weakness generalized, dehydration, poor oral intake and weight loss in setting of metastatic pancreatic cancer - unable to ambulate- not wanting to go back home without a discussion with oncology and palliative care - start IVF- she has anasarca so will add Albumin x 3 doses - consult palliative care and oncology - plan for home  with hospice/ palliative care- she is not interested in a hospice facility - pain control with Hydrocodone and Gabapentin - she will be in observation status and likely will go home once further discussions are had  Active Problems:  DM - place on a low dose Novolog sliding scale to prevent hypoglycemia  Hypokalemia - replace - check Mg  Anemia, microcytic - likely due to cancer- hold off on further  work up    S/P total thyroidectomy - cont Synthroid    DVT prophylaxis: Lovenox  Code Status: SNF  Consults called: oncology and palliative care  Admission status:  Level of care: Med-Surg  Sedalia Dacosta MD [facility]    01/05/2024, 5:56 PM

## 2024-01-05 NOTE — TOC CM/SW Note (Signed)
 Transition of Care [facility]) - Emergency Department Mini Assessment   Patient Details  Name: Felicia Garrett MRN: [id number] Date of Birth: 1955-10-09  Transition of [facility]) CM/SW Contact:    Genice Kettle, LCSW Phone Number: 01/05/2024, 6:46 PM   Clinical Narrative:  Social worker met with patient at bedside . Patient was alert, and oriented to person, place, and situation. Discussion was focused on discharge planning and care preferences. Patient reports increasing physical weakness and shared that her sister , who has been her primary caregiver , is no longer able to provide care. As result patient expressed a desire to be discharge to a SNF for continued  support.   Social worker acknowledged patient's care preference and informed her that her request for SNF placement will be documented. Patient also reported that she would like to use her long [employer] thru [employer]ential for any care giving needs that are not covered.   ED Mini Assessment: What brought you to the Emergency Department? : Weakness  Barriers to Discharge: [facility] ACT Team     Means of departure: Car       Patient Contact and Communications     Spoke with: Patient Contact Date: 01/05/24,   Contact time: 1841             Admission diagnosis:  Weakness generalized [R53.1] Patient Active Problem List   Diagnosis Date Noted   Weakness generalized 01/05/2024   Decreased appetite 11/07/2023   Pure hypercholesterolemia 10/27/2023   Pancreatic cancer (HCC) 10/16/2023   Pancreatic mass 10/11/2023   Pancreatic lesion 10/10/2023   DKA (diabetic ketoacidosis) (HCC) 10/09/2023   Other fatigue 10/04/2023   Estrogen deficiency 04/15/2023   Palpitation 04/15/2023   Impacted cerumen of left ear 04/15/2023   Essential hypertension, benign 04/11/2023   Left ear pain 04/11/2023   Encounter for counseling 01/01/2023   Allergic rhinitis with postnasal drip 03/19/2022   Overweight with  body mass index (BMI) of 25 to 25.9 in adult 03/19/2022   Leukopenia 09/14/2021   Uncontrolled type 2 diabetes mellitus with hyperglycemia (HCC) 06/01/2019   Cervical high risk HPV (human papillomavirus) test positive 08/28/2018   Abnormal cervical Papanicolaou smear 08/28/2018   Bacterial vaginosis 08/28/2018   Hyperlipidemia associated with type 2 diabetes mellitus (HCC) 08/28/2018   Goiter 08/28/2018   Anemia 07/03/2018   Type 2 diabetes mellitus with hyperlipidemia (HCC) 07/03/2018   Postsurgical hypothyroidism 10/08/2014   S/P total thyroidectomy 10/08/2014   Thyroid nodule 10/20/2012   Obesity status post 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 09/05/2011   PCP:  Cleave Curling, MD Pharmacy:   [facility] [phone] [place], [place] - [address] [place] [address] Phone: [phone] Fax: [phone]

## 2024-01-05 NOTE — ED Triage Notes (Signed)
 Pt BIB EMS from Home due to increase in weakness since Friday, recurring abdominal pain, and constipation for 2 weeks. Pt report weakness getting worse. Pt is current in treatment for Pancreatic cancer. Pt report abdominal pain is chronic due to cancer. Pt can stand be can walk on her own; pt developed bedsore 2 weeks pt report it has not been treated.  In Route BP 112/64 HR 100 RR 18 SpO2 98% CBG 276

## 2024-01-06 ENCOUNTER — Ambulatory Visit: Payer: Medicare PPO | Admitting: Internal Medicine

## 2024-01-06 DIAGNOSIS — E785 Hyperlipidemia, unspecified: Secondary | ICD-10-CM | POA: Diagnosis present

## 2024-01-06 DIAGNOSIS — Z79899 Other long term (current) drug therapy: Secondary | ICD-10-CM | POA: Diagnosis not present

## 2024-01-06 DIAGNOSIS — Z515 Encounter for palliative care: Secondary | ICD-10-CM | POA: Diagnosis not present

## 2024-01-06 DIAGNOSIS — Z7189 Other specified counseling: Secondary | ICD-10-CM

## 2024-01-06 DIAGNOSIS — E860 Dehydration: Secondary | ICD-10-CM | POA: Diagnosis present

## 2024-01-06 DIAGNOSIS — C259 Malignant neoplasm of pancreas, unspecified: Secondary | ICD-10-CM | POA: Diagnosis present

## 2024-01-06 DIAGNOSIS — G893 Neoplasm related pain (acute) (chronic): Secondary | ICD-10-CM

## 2024-01-06 DIAGNOSIS — E890 Postprocedural hypothyroidism: Secondary | ICD-10-CM | POA: Diagnosis present

## 2024-01-06 DIAGNOSIS — Z66 Do not resuscitate: Secondary | ICD-10-CM

## 2024-01-06 DIAGNOSIS — C799 Secondary malignant neoplasm of unspecified site: Secondary | ICD-10-CM | POA: Diagnosis present

## 2024-01-06 DIAGNOSIS — R630 Anorexia: Secondary | ICD-10-CM

## 2024-01-06 DIAGNOSIS — I10 Essential (primary) hypertension: Secondary | ICD-10-CM | POA: Diagnosis present

## 2024-01-06 DIAGNOSIS — E876 Hypokalemia: Secondary | ICD-10-CM | POA: Diagnosis present

## 2024-01-06 DIAGNOSIS — K5900 Constipation, unspecified: Secondary | ICD-10-CM

## 2024-01-06 DIAGNOSIS — Z681 Body mass index (BMI) 19 or less, adult: Secondary | ICD-10-CM | POA: Diagnosis not present

## 2024-01-06 DIAGNOSIS — Z86718 Personal history of other venous thrombosis and embolism: Secondary | ICD-10-CM | POA: Diagnosis not present

## 2024-01-06 DIAGNOSIS — G4700 Insomnia, unspecified: Secondary | ICD-10-CM

## 2024-01-06 DIAGNOSIS — E1165 Type 2 diabetes mellitus with hyperglycemia: Secondary | ICD-10-CM | POA: Diagnosis present

## 2024-01-06 DIAGNOSIS — F32A Depression, unspecified: Secondary | ICD-10-CM | POA: Diagnosis present

## 2024-01-06 DIAGNOSIS — Z79890 Hormone replacement therapy (postmenopausal): Secondary | ICD-10-CM | POA: Diagnosis not present

## 2024-01-06 DIAGNOSIS — Z7901 Long term (current) use of anticoagulants: Secondary | ICD-10-CM | POA: Diagnosis not present

## 2024-01-06 DIAGNOSIS — E871 Hypo-osmolality and hyponatremia: Secondary | ICD-10-CM | POA: Diagnosis present

## 2024-01-06 DIAGNOSIS — D509 Iron deficiency anemia, unspecified: Secondary | ICD-10-CM | POA: Diagnosis present

## 2024-01-06 DIAGNOSIS — R531 Weakness: Secondary | ICD-10-CM | POA: Diagnosis not present

## 2024-01-06 DIAGNOSIS — R627 Adult failure to thrive: Secondary | ICD-10-CM | POA: Diagnosis present

## 2024-01-06 DIAGNOSIS — C252 Malignant neoplasm of tail of pancreas: Secondary | ICD-10-CM

## 2024-01-06 DIAGNOSIS — R64 Cachexia: Secondary | ICD-10-CM | POA: Diagnosis present

## 2024-01-06 DIAGNOSIS — Z794 Long term (current) use of insulin: Secondary | ICD-10-CM | POA: Diagnosis not present

## 2024-01-06 DIAGNOSIS — Z8249 Family history of ischemic heart disease and other diseases of the circulatory system: Secondary | ICD-10-CM | POA: Diagnosis not present

## 2024-01-06 LAB — COMPREHENSIVE METABOLIC PANEL WITH GFR
ALT: 11 U/L (ref 0–44)
AST: 12 U/L — ABNORMAL LOW (ref 15–41)
Albumin: 2.6 g/dL — ABNORMAL LOW (ref 3.5–5.0)
Alkaline Phosphatase: 54 U/L (ref 38–126)
Anion gap: 13 (ref 5–15)
BUN: 6 mg/dL — ABNORMAL LOW (ref 8–23)
CO2: 23 mmol/L (ref 22–32)
Calcium: 8.2 mg/dL — ABNORMAL LOW (ref 8.9–10.3)
Chloride: 98 mmol/L (ref 98–111)
Creatinine, Ser: 0.43 mg/dL — ABNORMAL LOW (ref 0.44–1.00)
GFR, Estimated: >60 mL/min (ref >60)
Glucose, Bld: 234 mg/dL — ABNORMAL HIGH (ref 70–99)
Potassium: 3.7 mmol/L (ref 3.5–5.1)
Sodium: 134 mmol/L — ABNORMAL LOW (ref 135–145)
Total Bilirubin: 1 mg/dL (ref 0.0–1.2)
Total Protein: 5.3 g/dL — ABNORMAL LOW (ref 6.5–8.1)

## 2024-01-06 LAB — CBC
HCT: 30.9 % — ABNORMAL LOW (ref 36.0–46.0)
Hemoglobin: 9 g/dL — ABNORMAL LOW (ref 12.0–15.0)
MCH: 20.7 pg — ABNORMAL LOW (ref 26.0–34.0)
MCHC: 29.1 g/dL — ABNORMAL LOW (ref 30.0–36.0)
MCV: 71 fL — ABNORMAL LOW (ref 80.0–100.0)
Platelets: 180 10*3/uL (ref 150–400)
RBC: 4.35 MIL/uL (ref 3.87–5.11)
RDW: 20.9 % — ABNORMAL HIGH (ref 11.5–15.5)
WBC: 4.4 10*3/uL (ref 4.0–10.5)
nRBC: 0.5 % — ABNORMAL HIGH (ref 0.0–0.2)

## 2024-01-06 LAB — MAGNESIUM: Magnesium: 1.6 mg/dL — ABNORMAL LOW (ref 1.7–2.4)

## 2024-01-06 LAB — PROTIME-INR
INR: 1.3 — ABNORMAL HIGH (ref 0.8–1.2)
Prothrombin Time: 16.4 s — ABNORMAL HIGH (ref 11.4–15.2)

## 2024-01-06 LAB — GLUCOSE, CAPILLARY
Glucose-Capillary: 209 mg/dL — ABNORMAL HIGH (ref 70–99)
Glucose-Capillary: 177 mg/dL — ABNORMAL HIGH (ref 70–99)
Glucose-Capillary: 226 mg/dL — ABNORMAL HIGH (ref 70–99)
Glucose-Capillary: 217 mg/dL — ABNORMAL HIGH (ref 70–99)

## 2024-01-06 MED ORDER — FENTANYL 25 MCG/HR TD PT72
1.0000 | MEDICATED_PATCH | TRANSDERMAL | Status: DC
  Administered 2024-01-06 – 2024-01-09 (×2): 1 via TRANSDERMAL
  Filled 2024-01-06 (×2): qty 1

## 2024-01-06 MED ORDER — ORAL CARE MOUTH RINSE: 15.0000 mL | OROMUCOSAL | Status: DC | PRN

## 2024-01-06 MED ORDER — FENTANYL 50 MCG/HR TD PT72: 1.0000 | MEDICATED_PATCH | TRANSDERMAL | Status: DC

## 2024-01-06 MED ORDER — ENSURE ENLIVE PO LIQD
237.0000 mL | Freq: Two times a day (BID) | ORAL | Status: DC
  Administered 2024-01-09 (×2): 237 mL via ORAL

## 2024-01-06 MED ORDER — MIRTAZAPINE 30 MG PO TABS
30.0000 mg | ORAL_TABLET | Freq: Every day | ORAL | Status: DC
  Administered 2024-01-06 – 2024-01-09 (×4): 30 mg via ORAL
  Filled 2024-01-06 (×4): qty 1

## 2024-01-06 MED ORDER — OXYCODONE HCL 5 MG PO TABS
10.0000 mg | ORAL_TABLET | ORAL | Status: DC | PRN
  Administered 2024-01-06 – 2024-01-10 (×6): 10 mg via ORAL
  Filled 2024-01-06 (×7): qty 2

## 2024-01-06 MED ORDER — ENOXAPARIN SODIUM 40 MG/0.4ML IJ SOSY
40.0000 mg | PREFILLED_SYRINGE | INTRAMUSCULAR | Status: DC
  Administered 2024-01-07: 40 mg via SUBCUTANEOUS
  Filled 2024-01-06: qty 0.4

## 2024-01-06 MED ORDER — LACTULOSE 10 GM/15ML PO SOLN
20.0000 g | Freq: Three times a day (TID) | ORAL | Status: DC
  Administered 2024-01-06 – 2024-01-10 (×9): 20 g via ORAL
  Filled 2024-01-06 (×10): qty 30

## 2024-01-06 MED ORDER — ACETAMINOPHEN 500 MG PO TABS
1000.0000 mg | ORAL_TABLET | Freq: Three times a day (TID) | ORAL | Status: DC
  Administered 2024-01-06 – 2024-01-09 (×5): 1000 mg via ORAL
  Filled 2024-01-06 (×10): qty 2

## 2024-01-06 MED ORDER — HYDROMORPHONE HCL 1 MG/ML IJ SOLN
1.0000 mg | INTRAMUSCULAR | Status: DC | PRN
  Administered 2024-01-06 – 2024-01-10 (×3): 1 mg via INTRAVENOUS
  Filled 2024-01-06 (×3): qty 1

## 2024-01-06 MED ORDER — DRONABINOL 5 MG PO CAPS
5.0000 mg | ORAL_CAPSULE | Freq: Two times a day (BID) | ORAL | Status: DC
  Administered 2024-01-06 – 2024-01-09 (×6): 5 mg via ORAL
  Filled 2024-01-06 (×9): qty 1

## 2024-01-06 NOTE — Progress Notes (Signed)
 [facility] Progress Note  Patient: Felicia Garrett     NWG:[id number]  DOA: 01/05/2024   PCP: [name], MD       Brief hospital course:  [name] is a 68 y.o. female with medical history of with metastatic pancreatic cancer, DM2 who presents for progressive weight loss, poor appetite, poor urine output and weakness. She is having difficulty ambulating. She has pain in upper abdomen and numbness in her legs. Food tastes bad and she has had to lower her dose of insulin since she is not eating much.   Admitted for pain management and dehydration.    Subjective:  Oral intake remains poor. She is nauseated and states that oral pain medications are further upsetting her stomach. Has ongoing med abdominal pain.   Assessment and Plan: Principal Problem:   Weakness generalized - will need to go to a facility as she is unable to get around on her own - TOC consulted  Active Problems:    Pancreatic cancer (HCC)   Decreased appetite - severe weight loss - addressed pain control- added Fentanyl patch- palliative care to follow - oncology also consulted    S/P total thyroidectomy - cont synthroid       Code Status: Limited: Do not attempt resuscitation (DNR) -DNR-LIMITED -Do Not Intubate/DNI  Total time on patient care: 35 min DVT prophylaxis:  enoxaparin (LOVENOX) injection 40 mg Start: 01/05/24 2200     Objective:   Vitals:   01/05/24 2003 01/06/24 0054 01/06/24 0542 01/06/24 0947  BP: 113/78 102/69 126/85 121/79  Pulse: 90 94 88 92  Resp: 15 15 15 17   Temp: 97.6 F (36.4 C) 98.7 F (37.1 C) 97.7 F (36.5 C) 98.6 F (37 C)  TempSrc: Oral Oral Oral Oral  SpO2: 100% 99% 98% 98%  Weight:      Height:       Filed Weights   01/05/24 1226  Weight: 56.7 kg   Exam: General exam: Appears comfortable  HEENT: oral mucosa moist Respiratory system: Clear to auscultation.  Cardiovascular system: S1 & S2 heard  Gastrointestinal system: Abdomen soft,  non-tender, nondistended. Normal bowel sounds   Extremities: No cyanosis, clubbing or edema Psychiatry:  depressed  CBC: Recent Labs  Lab 01/05/24 1330 01/06/24 0523  WBC 5.0 4.4  NEUTROABS 4.5  --   HGB 9.7* 9.0*  HCT 33.8* 30.9*  MCV 71.9* 71.0*  PLT 198 180   Basic Metabolic Panel: Recent Labs  Lab 01/05/24 1330 01/06/24 0523  NA 134* 134*  K 3.3* 3.7  CL 97* 98  CO2 25 23  GLUCOSE 256* 234*  BUN 7* 6*  CREATININE 0.41* 0.43*  CALCIUM 8.4* 8.2*  MG  --  1.6*     Scheduled Meds:  acetaminophen  1,000 mg Oral Q8H   dronabinol  5 mg Oral BID WC   enoxaparin (LOVENOX) injection  40 mg Subcutaneous Q24H   feeding supplement  237 mL Oral BID BM   fentaNYL  1 patch Transdermal Q72H   gabapentin  100 mg Oral TID   insulin aspart  0-9 Units Subcutaneous TID WC   lactulose  20 g Oral TID   levothyroxine  137 mcg Oral QAC breakfast   midodrine  2.5 mg Oral BID WC   mirtazapine  30 mg Oral QHS   senna  1 tablet Oral BID    Imaging and lab data personally reviewed   Author: Calvert Cantor  01/06/2024 12:50 PM  To contact [facility]>  Check the care team in [facility] and look for the attending/consulting [facility] provider listed  Log into [email or web address] and use [facility] universal password   Go to> "[facility]"  and find provider  If you still have difficulty reaching the provider, please page the [facility] (Director on Call) for the Hospitalists listed on amion

## 2024-01-06 NOTE — Progress Notes (Signed)
   01/06/24 1500  Spiritual Encounters  Type of Visit Initial  Care provided to: Pt and family  Referral source Chaplain team  Reason for visit Urgent spiritual support  OnCall Visit No  Spiritual Framework  Presenting Themes Meaning/purpose/sources of inspiration;Values and beliefs;Coping tools;Rituals and practive;Courage hope and growth  Patient Stress Factors Loss;Loss of control;Major life changes  Family Stress Factors Family relationships;Loss of control;Major life changes;Loss  Interventions  Spiritual Care Interventions Made Established relationship of care and support;Compassionate presence;Reflective listening;Normalization of emotions;Meaning making;Prayer  Intervention Outcomes  Outcomes Awareness of support;Awareness of health;Autonomy/agency;Connection to spiritual care   Chaplain met with patient who had her sister at her bedside - patient spoke of her faith narrative and grwoing up "My big Sister took good care of me" Sister verified this loving care.  Provifed spiritual care and comfort to patient and her family.  They requested prayer.  Did so and they requested Bible.  Will return with Bible for her today.  Ended visit with a departing blessing.

## 2024-01-06 NOTE — Plan of Care (Signed)

## 2024-01-06 NOTE — Progress Notes (Addendum)
 Felicia Garrett   DOB:1955-10-21   WU#:[id number]      ASSESSMENT & PLAN:  Metastatic pancreatic cancer, squamous cell carcinoma -Diagnosed 10/16/2023.  Diagnosed with a rare form of pancreatic squamous cell carcinoma located in the body and tail of the pancreas.-She was referred to [facility] for surgical and medical oncology evaluation.  Started FOLFIRINOX 10/25/2023. - Subsequently transitioned to FOLFOX as irinotecan was not well-tolerated.  Last received 01/01/2024, patient reports that she has received a total of 3 cycles of chemotherapy. - Patient follows at [facility] oncology/Dr. Mettu.  Last seen there 01/01/2024.  Has been seen previously at [facility] by Dr. Derral Colucci/oncology who will follow for this hospitalization. -Palliative has now been consulted for goals of care discussions  Failure to thrive Weight loss Fatigue -Patient has had several months of increasing fatigue.  Had been on Mounjaro for 1 year and lost 100 pounds prior to diagnosis. - Status post olanzapine and mirtazapine.  Also started on Marinol 01/01/24 which patient states is "not working".   - Continue supportive care  Anemia, microcytic - Hemoglobin 9.0 today - Baseline hemoglobin seems to be in the 10-11 range - Transfuse PRBC for hemoglobin <7.0.  No transfusional intervention required at this time - Continue to monitor CBC with differential  Diabetes - Continue to monitor blood sugar levels -Insulin coverage per protocol  Code Status DNR-limited  Subjective:  Patient is seen awake alert and oriented x 3, very pleasant.  Reports increasing fatigue and increasing weakness.  Confirms she has been seen Dr. Johny Blamer at [facility] and recently had third cycle of chemotherapy.  Admits to not eating well and that she has tried several medications, most recently Marinol, does not feel any of those medications have increased her appetite.  No pain reported.  No GI symptoms.  No acute distress is noted.  Objective:  Vitals:   01/06/24 0542 01/06/24  0947  BP: 126/85 121/79  Pulse: 88 92  Resp: 15 17  Temp: 97.7 F (36.5 C) 98.6 F (37 C)  SpO2: 98% 98%     Intake/Output Summary (Last 24 hours) at 01/06/2024 1026 Last data filed at 01/06/2024 2956 Gross per 24 hour  Intake 120 ml  Output 650 ml  Net -530 ml     PHYSICAL EXAMINATION: ECOG PERFORMANCE STATUS: 3 - Symptomatic, >50% confined to bed  Vitals:   01/06/24 0542 01/06/24 0947  BP: 126/85 121/79  Pulse: 88 92  Resp: 15 17  Temp: 97.7 F (36.5 C) 98.6 F (37 C)  SpO2: 98% 98%   Filed Weights   01/05/24 1226  Weight: 125 lb (56.7 kg)    GENERAL: alert, no distress and comfortable SKIN: skin color, texture, turgor are normal, no rashes or significant lesions EYES: normal, conjunctiva are pink and non-injected, sclera clear OROPHARYNX: no exudate, no erythema and lips, buccal mucosa, and tongue normal  NECK: supple, thyroid normal size, non-tender, without nodularity LYMPH: no palpable lymphadenopathy in the cervical, axillary or inguinal LUNGS: clear to auscultation and percussion with normal breathing effort HEART: regular rate & rhythm and no murmurs and no lower extremity edema ABDOMEN: abdomen soft, non-tender and normal bowel sounds MUSCULOSKELETAL: no cyanosis of digits and no clubbing  PSYCH: alert & oriented x 3 with fluent speech NEURO: no focal motor/sensory deficits   All questions were answered. The patient knows to call the clinic with any problems, questions or concerns.   The total time spent in the appointment was 40 minutes encounter with patient including review of chart  and various tests results, discussions about plan of care and coordination of care plan  Dawson Bills, NP 01/06/2024 10:26 AM    Labs Reviewed:  Lab Results  Component Value Date   WBC 4.4 01/06/2024   HGB 9.0 (L) 01/06/2024   HCT 30.9 (L) 01/06/2024   MCV 71.0 (L) 01/06/2024   PLT 180 01/06/2024   Recent Labs    10/10/23 0759 10/11/23 0517 10/17/23 0949  01/05/24 1330 01/06/24 0523  NA  --    < > 137 134* 134*  K  --    < > 3.9 3.3* 3.7  CL  --    < > 99 97* 98  CO2  --    < > 30 25 23   GLUCOSE  --    < > 192* 256* 234*  BUN  --    < > 6* 7* 6*  CREATININE  --    < > 0.64 0.41* 0.43*  CALCIUM  --    < > 9.0 8.4* 8.2*  GFRNONAA  --    < > >60 >60 >60  PROT 5.6*   < > 6.5 5.7* 5.3*  ALBUMIN 2.7*   < > 3.4* 2.7* 2.6*  AST 14*   < > 22 16 12*  ALT 15   < > 19 12 11   ALKPHOS 59   < > 73 61 54  BILITOT 0.9   < > 0.5 0.9 1.0  BILIDIR 0.2  --   --   --   --   IBILI 0.7  --   --   --   --    < > = values in this interval not displayed.    Studies Reviewed:  CT ABDOMEN PELVIS W CONTRAST Result Date: 01/05/2024 CLINICAL DATA:  Abdominal pain, acute, nonlocalized EXAM: CT ABDOMEN AND PELVIS WITH CONTRAST TECHNIQUE: Multidetector CT imaging of the abdomen and pelvis was performed using the standard protocol following bolus administration of intravenous contrast. RADIATION DOSE REDUCTION: This exam was performed according to the departmental dose-optimization program which includes automated exposure control, adjustment of the mA and/or kV according to patient size and/or use of iterative reconstruction technique. CONTRAST:  OMNIPAQUE IOHEXOL 300 MG/ML  SOLN COMPARISON:  October 08, 2023 FINDINGS: Lower chest: No focal airspace consolidation or pleural effusion.Fibrolinear scarring in the lung bases. Partially visualized central venous catheter at the cavoatrial junction. Hepatobiliary: No mass. A couple of small cysts in both hepatic lobes measuring up to 2 cm.No radiopaque stones or wall thickening of the gallbladder.No intrahepatic or extrahepatic biliary ductal dilation.While the portal vein is patent, there is severe narrowing at the portal splenic confluence. Pancreas: Increasing size of the infiltrative mass in the upstream pancreatic body and tail, measuring approximately 4.8 x 7.6 cm (previously, 4.1 by 5 cm, by my measurement).  Additionally, hypoattenuating, infiltrative soft tissue extends into the downstream pancreatic body and neck and extending inferiorly into the root of the mesentery (for example, axial 30), encasing and severely narrowing the jejunal branches and the downstream SMV. Additionally, the soft tissue extends posteriorly and encases the celiac artery causing moderate to severe narrowing of celiac trifurcation and splenic arteries. Spleen: Normal size. No mass. Adrenals/Urinary Tract: No adrenal masses. Unchanged left renal cyst. No mass. No nephrolithiasis or hydronephrosis. The urinary bladder is distended without focal abnormality. Stomach/Bowel: Similarly positioned gastric lap band. Diffuse gastric submucosal edema and wall thickening. No small bowel wall thickening or inflammation. No small bowel obstruction. The appendix was not visualized.  No right lower quadrant or pericecal inflammatory changes to suggest acute appendicitis. Moderate volume fecal loading throughout the colon. Vascular/Lymphatic: No aortic aneurysm. Scattered aortoiliac atherosclerosis. Centrally necrotic retrocaval lymph node measuring 1.4 cm (axial 35). Reproductive: Age-related atrophy of the uterus and ovaries. No concerning adnexal mass. Calcified uterine fibroids.No free pelvic fluid. Other: Diffuse mesenteric edema. No pneumoperitoneum. Small volume ascites layering in the pelvis. Significant enlargement of a large peritoneal implant in the left upper quadrant, measuring 5.6 x 4.6 cm (previously 2.1 by 1.5 cm), axial 36. Musculoskeletal: No acute fracture or destructive lesion.Multilevel degenerative disc disease of the spine. Diffuse osteopenia. Thoracic DISH. Diffuse anasarca, worsening. Gastric lap band reservoir in the midline ventral abdominal wall. No discontinuity of the catheter tubing. IMPRESSION: 1. Diffuse gastric wall thickening with submucosal edema, which may be due to hypoproteinemia, acute gastritis, or peptic ulcer disease.  2. Significant enlargement of the infiltrative pancreatic mass, measuring 4.8 x 7.6 cm (previously, 4.1 x 5 cm), by my measurement. Increasing size of a left upper peritoneal nodule measuring 5.6 x 4.6 cm (previously, 2.1 by 1.5 cm). 3. Developing metastatic retrocaval lymphadenopathy, measuring 1.4 cm (axial 35). 4. Moderate volume fecal loading throughout the colon, as can be seen in constipation. 5. Diffuse anasarca with small volume ascites layering in the pelvis. Electronically Signed   By: Rance Burrows M.D.   On: 01/05/2024 15:54   Addendum I have seen the patient, examined her. I agree with the assessment and and plan and have edited the notes.   Ms. Hochstein has received a total of 3 cycle chemotherapy at [facility], overall did not tolerate well.  She was admitted for persistent fatigue, progressive weakness, and failure to thrive at home after last cycle chemotherapy on 4/9.  She is very realistic, understands her overall very poor prognosis.  If she does not recover well, she would not be able to get more chemotherapy.  She does not have children, her only sister is not able to offer more help at home.  She would like to go to a nursing home, and open to hospice if she does not recover in a few weeks.  She has agreed with DNR.  I spent a total of 35 minutes for her visit today, more than 50% time on face-to-face counseling.  Sonja [place] MD 01/06/2024

## 2024-01-06 NOTE — Consult Note (Signed)
 Consultation Note Date: 01/06/2024   Patient Name: Felicia Garrett  DOB: Nov 10, 1955  MRN: [id number]  Age / Sex: 68 y.o., female   PCP: [name], MD Referring Physician: Calvert Cantor, MD  Reason for Consultation: Establishing goals of care     Chief Complaint/History of Present Illness:  Patient is a 68 year old female with a past medical history of metastatic pancreatic cancer and diabetes mellitus type 2 who was admitted on 01/05/2024 for management of progressive weight loss, poor appetite, poor urine output, and weakness.  Patient noted she is no longer able to ambulate at home.  Patient admitted for management of anasarca, diabetes mellitus, and electrolyte abnormalities.  Patient wanting to speak with oncology and palliative medicine team about further cancer directed therapies and pathways for medical care moving forward.  Oncology consulted for recommendations.  Palliative medicine team consulted to assist with complex medical decision making. Of note patient is followed by palliative medicine team and oncology at [facility].  Reviewed recent palliative medicine team notes from [facility], patient last seen 01/03/2024.  At that time it was recommended patient be referred for celiac plexus block for management of pain. Discussed care with hospitalist, oncologist, and RN to coordinate care.  Hospitalist had already initiated patient on fentanyl patch 25 mcg every 72 hours.  Presented to bedside to see patient.  Patient laying in bed, no family or friends present at bedside.  Introduced myself as a member of the palliative medicine team.  Patient familiar with palliative medicine since she sees them at [facility].  Able to inquire about symptom burden at this time.  Patient notes epigastric pain associated with her pancreatic cancer.  We reviewed her medications and recommendations from [facility].  Patient supposed to be taking Tylenol 1000 mg every 8.  Patient noted she had not started this medication yet.   Patient has received oxycodone as needed here and patient feels that the 10 mg dose is assisting with pain management.  Patient also on gabapentin 100 mg 3 times daily.  Patient feels that current medications are assisting with her pain though it is still present.  Discussed referral to interventional radiology here for evaluation of celiac plexus block which patient agreed with since she wanted to have this done previously after discussing with [facility] palliative medicine team providers. Discussed other symptom management.  Patient is not eating much and is very fatigued.  Reviewed that patient had recently started Marinol 5 mg which she takes with breakfast and at lunch.  Noted can initiate this medication here as well.  Patient also notes she is on mirtazapine 30 mg nightly which helps her sleep.  Discussed we will restart this medication as well.  Inquired what patient was hoping for moving forward.  Patient notes that while she wants a "cure" she has heard this is not going to happen.  Patient does want to talk with oncology to determine if further cancer directed therapies are appropriate for her.  Noted plan for Dr. Mosetta Putt to follow-up later today. Patient also notes should she not be appropriate for further cancer directed therapies, she wants to be comfortable.  Patient does not want to die in pain or be a burden to her family.  Patient feels she has suffered enough with this disease process.  Acknowledged this and spent time providing emotional support via active listening.  Patient noted that she would consider a hospice facility for end-of-life care.  Acknowledged this.  Noted palliative medicine team will continue following along with patient's medical  journey to continue discussions regarding pathways for medical care moving forward.  Thank patient for allow me to visit with her today.  All questions answered at that time.  Palliative medicine team continuing to follow with patient's medical  journey.  Discussed care with IDT after visiting with patient to coordinate care.  Primary Diagnoses  Present on Admission:  Pancreatic cancer (HCC)  Decreased appetite   Palliative Review of Systems: Epigastric pain  Past Medical History:  Diagnosis Date   Anemia    Arthritis    Atherosclerosis of aorta (HCC)    Depression    Diabetes mellitus    Hyperlipidemia    Thyroid nodule    Social History   Socioeconomic History   Marital status: Single    Spouse name: Not on file   Number of children: Not on file   Years of education: Not on file   Highest education level: Master's degree (e.g., MA, MS, MEng, MEd, MSW, MBA)  Occupational History   Not on file  Tobacco Use   Smoking status: Never   Smokeless tobacco: Never  Vaping Use   Vaping status: Never Used  Substance and Sexual Activity   Alcohol use: No   Drug use: No   Sexual activity: Never    Comment: meno   Other Topics Concern   Not on file  Social History Narrative   Not on file   Social Drivers of Health   Financial Resource Strain: Low Risk  (01/02/2024)   Overall Financial Resource Strain (CARDIA)    Difficulty of Paying Living Expenses: Not hard at all  Food Insecurity: No Food Insecurity (01/05/2024)   Hunger Vital Sign    Worried About Running Out of Food in the Last Year: Never true    Ran Out of Food in the Last Year: Never true  Transportation Needs: No Transportation Needs (01/05/2024)   PRAPARE - Administrator, Civil Service (Medical): No    Lack of Transportation (Non-Medical): No  Physical Activity: Unknown (01/02/2024)   Exercise Vital Sign    Days of Exercise per Week: Patient declined    Minutes of Exercise per Session: Not on file  Stress: Patient Declined (01/02/2024)   [employer] of Occupational Health - Occupational Stress Questionnaire    Feeling of Stress : Patient declined  Social Connections: Moderately Integrated (01/05/2024)   Social Connection and  Isolation Panel [NHANES]    Frequency of Communication with Friends and Family: More than three times a week    Frequency of Social Gatherings with Friends and Family: Three times a week    Attends Religious Services: More than 4 times per year    Active Member of Clubs or Organizations: Yes    Attends Banker Meetings: 1 to 4 times per year    Marital Status: Never married   Family History  Problem Relation Age of Onset   Hypertension Mother    Hyperlipidemia Mother    Hyperthyroidism Mother    Diabetes Father    Thyroid disease Sister    Hyperlipidemia Brother    Hypertension Brother    Diabetes Brother    Scheduled Meds:  enoxaparin (LOVENOX) injection  40 mg Subcutaneous Q24H   gabapentin  100 mg Oral TID   insulin aspart  0-9 Units Subcutaneous TID WC   levothyroxine  137 mcg Oral QAC breakfast   midodrine  2.5 mg Oral BID WC   senna  1 tablet Oral BID   Continuous Infusions:  sodium chloride 75 mL/hr at 01/05/24 1943   albumin human 12.5 g (01/05/24 2215)   PRN Meds:.acetaminophen **OR** acetaminophen, ondansetron **OR** ondansetron (ZOFRAN) IV, oxyCODONE Allergies  Allergen Reactions   Penicillins Shortness Of Breath    Chest pain   CBC:    Component Value Date/Time   WBC 4.4 01/06/2024 0523   HGB 9.0 (L) 01/06/2024 0523   HGB 10.6 (L) 11/06/2023 1624   HCT 30.9 (L) 01/06/2024 0523   HCT 37.5 11/06/2023 1624   PLT 180 01/06/2024 0523   PLT 305 11/06/2023 1624   MCV 71.0 (L) 01/06/2024 0523   MCV 73 (L) 11/06/2023 1624   NEUTROABS 4.5 01/05/2024 1330   LYMPHSABS 0.3 (L) 01/05/2024 1330   MONOABS 0.2 01/05/2024 1330   EOSABS 0.0 01/05/2024 1330   BASOSABS 0.0 01/05/2024 1330   Comprehensive Metabolic Panel:    Component Value Date/Time   NA 134 (L) 01/06/2024 0523   NA 138 10/04/2023 1102   K 3.7 01/06/2024 0523   CL 98 01/06/2024 0523   CO2 23 01/06/2024 0523   BUN 6 (L) 01/06/2024 0523   BUN 10 10/04/2023 1102   CREATININE 0.43 (L)  01/06/2024 0523   CREATININE 0.78 09/14/2021 1059   GLUCOSE 234 (H) 01/06/2024 0523   CALCIUM 8.2 (L) 01/06/2024 0523   AST 12 (L) 01/06/2024 0523   AST 15 09/14/2021 1059   ALT 11 01/06/2024 0523   ALT 14 09/14/2021 1059   ALKPHOS 54 01/06/2024 0523   BILITOT 1.0 01/06/2024 0523   BILITOT 0.6 10/04/2023 1102   BILITOT 0.6 09/14/2021 1059   PROT 5.3 (L) 01/06/2024 0523   PROT 6.8 10/04/2023 1102   ALBUMIN 2.6 (L) 01/06/2024 0523   ALBUMIN 4.3 10/04/2023 1102    Physical Exam: Vital Signs: BP 126/85 (BP Location: Left Arm)   Pulse 88   Temp 97.7 F (36.5 C) (Oral)   Resp 15   Ht 5\' 9"  (1.753 m)   Wt 56.7 kg   SpO2 98%   BMI 18.46 kg/m  SpO2: SpO2: 98 % O2 Device: O2 Device: Room Air O2 Flow Rate:   Intake/output summary:  Intake/Output Summary (Last 24 hours) at 01/06/2024 0805 Last data filed at 01/06/2024 1308 Gross per 24 hour  Intake 120 ml  Output 650 ml  Net -530 ml   LBM: Last BM Date :  (pt states 3 weeks ago) Baseline Weight: Weight: 56.7 kg Most recent weight: Weight: 56.7 kg  General: NAD, alert, cachectic, frail, ill-appearing Cardiovascular: RRR, anasarca present in extremities  Respiratory: no increased work of breathing noted, not in respiratory distress Skin: no rashes or lesions on visible skin Neuro: A&Ox4, following commands easily Psych: appropriately answers all questions          Palliative Performance Scale: 20%               Additional Data Reviewed: Recent Labs    01/05/24 1330 01/06/24 0523  WBC 5.0 4.4  HGB 9.7* 9.0*  PLT 198 180  NA 134* 134*  BUN 7* 6*  CREATININE 0.41* 0.43*    Imaging: CT ABDOMEN PELVIS W CONTRAST CLINICAL DATA:  Abdominal pain, acute, nonlocalized  EXAM: CT ABDOMEN AND PELVIS WITH CONTRAST  TECHNIQUE: Multidetector CT imaging of the abdomen and pelvis was performed using the standard protocol following bolus administration of intravenous contrast.  RADIATION DOSE REDUCTION: This exam was  performed according to the departmental dose-optimization program which includes automated exposure control, adjustment of the mA and/or kV  according to patient size and/or use of iterative reconstruction technique.  CONTRAST:  OMNIPAQUE IOHEXOL 300 MG/ML  SOLN  COMPARISON:  October 08, 2023  FINDINGS: Lower chest: No focal airspace consolidation or pleural effusion.Fibrolinear scarring in the lung bases. Partially visualized central venous catheter at the cavoatrial junction.  Hepatobiliary: No mass. A couple of small cysts in both hepatic lobes measuring up to 2 cm.No radiopaque stones or wall thickening of the gallbladder.No intrahepatic or extrahepatic biliary ductal dilation.While the portal vein is patent, there is severe narrowing at the portal splenic confluence.  Pancreas: Increasing size of the infiltrative mass in the upstream pancreatic body and tail, measuring approximately 4.8 x 7.6 cm (previously, 4.1 by 5 cm, by my measurement). Additionally, hypoattenuating, infiltrative soft tissue extends into the downstream pancreatic body and neck and extending inferiorly into the root of the mesentery (for example, axial 30), encasing and severely narrowing the jejunal branches and the downstream SMV. Additionally, the soft tissue extends posteriorly and encases the celiac artery causing moderate to severe narrowing of celiac trifurcation and splenic arteries.  Spleen: Normal size. No mass.  Adrenals/Urinary Tract: No adrenal masses. Unchanged left renal cyst. No mass. No nephrolithiasis or hydronephrosis. The urinary bladder is distended without focal abnormality.  Stomach/Bowel: Similarly positioned gastric lap band. Diffuse gastric submucosal edema and wall thickening. No small bowel wall thickening or inflammation. No small bowel obstruction. The appendix was not visualized. No right lower quadrant or pericecal inflammatory changes to suggest acute  appendicitis. Moderate volume fecal loading throughout the colon.  Vascular/Lymphatic: No aortic aneurysm. Scattered aortoiliac atherosclerosis. Centrally necrotic retrocaval lymph node measuring 1.4 cm (axial 35).  Reproductive: Age-related atrophy of the uterus and ovaries. No concerning adnexal mass. Calcified uterine fibroids.No free pelvic fluid.  Other: Diffuse mesenteric edema. No pneumoperitoneum. Small volume ascites layering in the pelvis. Significant enlargement of a large peritoneal implant in the left upper quadrant, measuring 5.6 x 4.6 cm (previously 2.1 by 1.5 cm), axial 36.  Musculoskeletal: No acute fracture or destructive lesion.Multilevel degenerative disc disease of the spine. Diffuse osteopenia. Thoracic DISH. Diffuse anasarca, worsening. Gastric lap band reservoir in the midline ventral abdominal wall. No discontinuity of the catheter tubing.  IMPRESSION: 1. Diffuse gastric wall thickening with submucosal edema, which may be due to hypoproteinemia, acute gastritis, or peptic ulcer disease. 2. Significant enlargement of the infiltrative pancreatic mass, measuring 4.8 x 7.6 cm (previously, 4.1 x 5 cm), by my measurement. Increasing size of a left upper peritoneal nodule measuring 5.6 x 4.6 cm (previously, 2.1 by 1.5 cm). 3. Developing metastatic retrocaval lymphadenopathy, measuring 1.4 cm (axial 35). 4. Moderate volume fecal loading throughout the colon, as can be seen in constipation. 5. Diffuse anasarca with small volume ascites layering in the pelvis.  Electronically Signed   By: Wallie Char M.D.   On: 01/05/2024 15:54    I personally reviewed recent imaging.   Palliative Care Assessment and Plan Summary of Established Goals of Care and Medical Treatment Preferences   Patient is a 68 year old female with a past medical history of metastatic pancreatic cancer and diabetes mellitus type 2 who was admitted on 01/05/2024 for management of  progressive weight loss, poor appetite, poor urine output, and weakness.  Patient noted she is no longer able to ambulate at home.  Patient admitted for management of anasarca, diabetes mellitus, and electrolyte abnormalities.  Patient wanting to speak with oncology and palliative medicine team about further cancer directed therapies and pathways for medical care moving forward.  Oncology consulted for recommendations.  Palliative medicine team consulted to assist with complex medical decision making. Of note patient is followed by palliative medicine team and oncology at [facility].  Reviewed recent palliative medicine team notes from [facility], patient last seen 01/03/2024.  At that time it was recommended patient be referred for celiac plexus block for management of pain.  # Complex medical decision making/goals of care  - Discussed care with patient as detailed above in HPI.  Patient awaiting further discussions with oncology regarding possible cancer directed therapies moving forward.  Patient normally seen at [facility] for oncology and palliative medicine management.  While patient hoping for more time, she acknowledges that this cancer cannot be cured.  Patient notes that should she not be a candidate for further cancer directed therapies, she does not want to die in pain and so symptom management would be the priority.  Patient also notes that she does not want to be a burden to her family at the end of life.  Patient noted she would be willing to consider hospice facility since her family is no longer able to provide 24/7 care for her at home.  -  Code Status: Limited: Do not attempt resuscitation (DNR) -DNR-LIMITED -Do Not Intubate/DNI    # Symptom management Patient is receiving these palliative interventions for symptom management with an intent to improve quality of life.   - Pain, acute on chronic in setting of metastatic pancreatic cancer   - Start Tylenol 1000 mg every 8 hours scheduled during the day   -  Continue oxycodone 10 mg every 4 hours as needed   - Receiving fentanyl patch 25 mcg every 72 hour.   - Continue gabapentin 100 mg 3 times daily   - Continue IV Dilaudid 1 mg every 3 hours as needed breakthrough pain after oral short acting opioids   - Consulted IR for evaluation of celiac plexus blocks   - Insomnia   -Start patient's home dose of mirtazapine 30 mg nightly   - Reviewed EKG obtained on 01/05/2024 noting QTc 474    -Constipation Reviewed CT noting moderate stool burden.  Patient notes she has not had a bowel movement in "2 to 3 weeks.  Patient noted she had discussed this with [facility] palliative medicine team who had started her on multiple medications and she had recently gotten an enema that this had not assist with bowel movement.   -Start lactulose 20 mg 3 times daily   - Provide with soapsuds enema at this time   - Continue senna 2 tabs twice daily   - Consider addition of MiraLAX when appropriate   - Poor appetite   - Start outpatient medication of Marinol 5 mg with breakfast and lunch  # Discharge Planning:  To Be Determined  Thank you for allowing the palliative care team to participate in the care [facility].  Alvester Morin, DO Palliative Care Provider PMT # [phone]  If patient remains symptomatic despite maximum doses, please call PMT at [phone] between 0700 and 1900. Outside of these hours, please call attending, as PMT does not have night coverage.  Personally spent 80 minutes in patient care including extensive chart review (labs, imaging, progress/consult notes, vital signs), medically appropraite exam, discussed with treatment team, education to patient, family, and staff, documenting clinical information, medication review and management, coordination of care, and available advanced directive documents.

## 2024-01-06 NOTE — Consult Note (Cosign Needed Addendum)
 Chief Complaint: Metastatic pancreatic cancer, persistent abdominal pain; referred for image guided celiac plexus block  Referring Provider(s): Mims,L  Supervising Physician: Roanna Banning  Patient Status: [name] - In-pt  History of Present Illness: Felicia Garrett is a 68 y.o. female with past medical history significant for metastatic pancreatic cancer, diabetes, arthritis, depression, hyperlipidemia, anemia was recently admitted to [facility] with progressive weight loss, poor appetite, poor urine output,  weakness, and persistent abdominal pain.  Patient also followed by palliative medicine team and oncology at [facility] and per their previous notes it was recommended that patient be considered for celiac plexus block.  Patient currently on Tylenol, Marinol, Duragesic patch, Neurontin, Dilaudid, and oxycodone.  She continues to have moderate upper abdominal discomfort and was seen by palliative care team here.  Request now placed for image guided celiac plexus block.  DNR;- limited  Past Medical History:  Diagnosis Date   Anemia    Arthritis    Atherosclerosis of aorta (HCC)    Depression    Diabetes mellitus    Hyperlipidemia    Thyroid nodule     Past Surgical History:  Procedure Laterality Date   ESOPHAGOGASTRODUODENOSCOPY (EGD) WITH PROPOFOL N/A 10/11/2023   Procedure: ESOPHAGOGASTRODUODENOSCOPY (EGD) WITH PROPOFOL;  Surgeon: Jeani Hawking, MD;  Location: [facility] ENDOSCOPY;  Service: Gastroenterology;  Laterality: N/A;   EUS N/A 10/11/2023   Procedure: UPPER ENDOSCOPIC ULTRASOUND (EUS) LINEAR;  Surgeon: Jeani Hawking, MD;  Location: [facility] ENDOSCOPY;  Service: Gastroenterology;  Laterality: N/A;   FINE NEEDLE ASPIRATION  10/11/2023   Procedure: FINE NEEDLE ASPIRATION (FNA) LINEAR;  Surgeon: Jeani Hawking, MD;  Location: [facility] ENDOSCOPY;  Service: Gastroenterology;;   LAPAROSCOPIC GASTRIC BANDING  06/14/08   THYROIDECTOMY N/A 10/08/2014   Procedure: NEAR TOTAL THYROIDECTOMY;  Surgeon: Valarie Merino, MD;  Location: [facility] ORS;  Service: General;  Laterality: N/A;   UTERINE FIBROID EMBOLIZATION  10 yrs ago    Allergies: Penicillins  Medications: Prior to Admission medications   Medication Sig Start Date End Date Taking? Authorizing Provider  acetaminophen (TYLENOL) 500 MG tablet Take 1,000 mg by mouth in the morning, at noon, and at bedtime.   Yes [provider]  apixaban (ELIQUIS) 5 MG TABS tablet Take 5 mg by mouth 2 (two) times daily. 12/04/23 12/03/24 Yes [provider]  atorvastatin (LIPITOR) 40 MG tablet TAKE 1 TABLET BY MOUTH EVERY DAY Patient taking differently: Take 40 mg by mouth every evening. 08/19/23  Yes [name], MD  benzonatate (TESSALON) 200 MG capsule Take 1 capsule (200 mg total) by mouth 3 (three) times daily as needed for cough. 10/13/23  Yes Rai, Ripudeep K, MD  Continuous Glucose Sensor (FREESTYLE LIBRE 3 SENSOR) MISC Place 1 sensor on the skin every 14 days. Use to check glucose continuously 10/28/23  Yes [name], MD  dexamethasone (DECADRON) 4 MG tablet Take 4 mg by mouth daily. 11/20/23  Yes [provider]  dronabinol (MARINOL) 5 MG capsule Take 5 mg by mouth 2 (two) times daily before a meal. 01/01/24 01/31/24 Yes [provider]  DRYSOL 20 % external solution APPLY TO AFFECTED AREA EVERY DAY AS NEEDED 01/01/24  Yes [name], MD  gabapentin (NEURONTIN) 100 MG capsule Take 100 mg by mouth See admin instructions. Take 1 cap by mouth at bedtime for 3 days, then 1 cap bid for 3 days, then 1 cap tid for 24 days. 01/03/24 02/02/24 Yes [provider]  insulin aspart (NOVOLOG) 100 UNIT/ML FlexPen Inject 0-6 Units  into the skin 3 (three) times daily with meals. Check Blood Glucose (BG) and inject per scale: BG <150= 0 unit; BG 150-200= 1 unit; BG 201-250= 2 unit; BG 251-300= 3 unit; BG 301-350= 4 unit; BG 351-400= 5 unit; BG >400= 6 unit and Call Primary Care. 10/13/23  Yes Rai, Ripudeep K, MD  insulin glargine  (LANTUS) 100 UNIT/ML Solostar Pen Inject 13 Units into the skin at bedtime. May substitute as needed per insurance. 10/13/23  Yes Rai, Ripudeep K, MD  Iron-FA-B Cmp-C-Biot-Probiotic (FUSION PLUS) CAPS TAKE 1 CAPSULE BY MOUTH ONCE DAILY BETWEEN MEALS Patient taking differently: Take 1 capsule by mouth 2 (two) times a week. 01/14/23  Yes Cleave Curling, MD  levothyroxine (SYNTHROID) 137 MCG tablet TAKE 1 TABLET BY MOUTH DAILY BEFORE BREAKFAST. 12/16/23  Yes Cleave Curling, MD  lidocaine-prilocaine (EMLA) cream Apply to affected area once 10/17/23  Yes Sonja [place], MD  methocarbamol (ROBAXIN) 750 MG tablet TAKE 1 TABLET BY MOUTH 3 TIMES DAILY. Patient taking differently: Take 750 mg by mouth 3 (three) times daily as needed for muscle spasms. 08/08/22  Yes Cleave Curling, MD  midodrine (PROAMATINE) 5 MG tablet Take 5 mg by mouth 3 (three) times daily as needed. 11/20/23 11/19/24 Yes [provider]  mirtazapine (REMERON) 30 MG tablet TAKE 0.5 TABLETS (15 MG TOTAL) BY MOUTH AT BEDTIME. 12/02/23 12/01/24 Yes Cleave Curling, MD  OLANZapine (ZYPREXA) 5 MG tablet Take 5 mg by mouth at bedtime. 12/31/23  Yes [provider]  ondansetron (ZOFRAN) 8 MG tablet Take 1 tablet (8 mg total) by mouth every 8 (eight) hours as needed for nausea or vomiting. Start on the third day after chemotherapy 10/17/23  Yes Sonja Firth, MD  oxyCODONE (OXY IR/ROXICODONE) 5 MG immediate release tablet Take 5-10 mg by mouth every 4 (four) hours as needed.   Yes [provider]  Pancrelipase, Lip-Prot-Amyl, 24000-76000 units CPEP Take 2 capsules by mouth 3 (three) times daily with meals. 12/23/23 12/22/24 Yes [provider]  polyethylene glycol powder (GLYCOLAX/MIRALAX) 17 GM/SCOOP powder Take 17 g by mouth daily as needed for moderate constipation. 01/03/24  Yes [provider]  senna (SENOKOT) 8.6 MG tablet Take 2 tablets by mouth 2 (two) times daily. May increase as needed up to 8 tabs per day 01/03/24  Yes  [provider]  sodium fluoride (PREVIDENT 5000 PLUS) 1.1 % CREA dental cream Place 1 Application onto teeth 2 (two) times a week.   Yes [provider]  triamcinolone cream (KENALOG) 0.1 % APPLY TO AFFECTED AREA TWICE DAILY AS NEEDED 10/29/23  Yes Cleave Curling, MD  Blood Glucose Monitoring Suppl DEVI 1 each by Does not apply route 3 (three) times daily. May dispense any manufacturer covered by patient's insurance. 10/13/23   Rai, Ripudeep K, MD  buPROPion (WELLBUTRIN XL) 150 MG 24 hr tablet TAKE 1 TABLET BY MOUTH EVERY DAY Patient not taking: Reported on 11/06/2023 08/19/23   Cleave Curling, MD  Glucose Blood (BLOOD GLUCOSE TEST STRIPS) STRP 1 each by Does not apply route 3 (three) times daily. Use as directed to check blood sugar. May dispense any manufacturer covered by patient's insurance and fits patient's device. 10/13/23   Rai, Ripudeep K, MD  HYDROcodone-acetaminophen (NORCO/VICODIN) 5-325 MG tablet Take 1 tablet by mouth every 6 (six) hours as needed for moderate pain (pain score 4-6). Patient not taking: Reported on 01/05/2024 11/06/23   Cleave Curling, MD  Insulin Pen Needle (PEN NEEDLES) 31G X 5 MM MISC 1 each by  Does not apply route 3 (three) times daily. May dispense any manufacturer covered by patient's insurance. 10/13/23   Rai, Hurman Maiden, MD  Lancet Device MISC 1 each by Does not apply route 3 (three) times daily. May dispense any manufacturer covered by patient's insurance. 10/13/23   Rai, Hurman Maiden, MD  Lancets MISC 1 each by Does not apply route 3 (three) times daily. Use as directed to check blood sugar. May dispense any manufacturer covered by patient's insurance and fits patient's device. 10/13/23   Loma Rising, MD     Family History  Problem Relation Age of Onset   Hypertension Mother    Hyperlipidemia Mother    Hyperthyroidism Mother    Diabetes Father    Thyroid disease Sister    Hyperlipidemia Brother    Hypertension Brother    Diabetes Brother      Social History   Socioeconomic History   Marital status: Single    Spouse name: Not on file   Number of children: Not on file   Years of education: Not on file   Highest education level: Master's degree (e.g., MA, MS, MEng, MEd, MSW, MBA)  Occupational History   Not on file  Tobacco Use   Smoking status: Never   Smokeless tobacco: Never  Vaping Use   Vaping status: Never Used  Substance and Sexual Activity   Alcohol use: No   Drug use: No   Sexual activity: Never    Comment: meno   Other Topics Concern   Not on file  Social History Narrative   Not on file   Social Drivers of Health   Financial Resource Strain: Low Risk  (01/02/2024)   Overall Financial Resource Strain (CARDIA)    Difficulty of Paying Living Expenses: Not hard at all  Food Insecurity: No Food Insecurity (01/05/2024)   Hunger Vital Sign    Worried About Running Out of Food in the Last Year: Never true    Ran Out of Food in the Last Year: Never true  Transportation Needs: No Transportation Needs (01/05/2024)   PRAPARE - Administrator, Civil Service (Medical): No    Lack of Transportation (Non-Medical): No  Physical Activity: Unknown (01/02/2024)   Exercise Vital Sign    Days of Exercise per Week: Patient declined    Minutes of Exercise per Session: Not on file  Stress: Patient Declined (01/02/2024)   [employer] of Occupational Health - Occupational Stress Questionnaire    Feeling of Stress : Patient declined  Social Connections: Socially Isolated (01/06/2024)   Social Connection and Isolation Panel [NHANES]    Frequency of Communication with Friends and Family: Never    Frequency of Social Gatherings with Friends and Family: Never    Attends Religious Services: Never    Database administrator or Organizations: Yes    Attends Engineer, structural: 1 to 4 times per year    Marital Status: Never married       Review of Systems currently denies fever, headache, dyspnea,  cough, back pain, nausea, vomiting or bleeding.  She continues to have mid abdominal/lower substernal discomfort, weakness  Vital Signs: BP 121/79 (BP Location: Left Arm)   Pulse 92   Temp 98.6 F (37 C) (Oral)   Resp 17   Ht 5\' 9"  (1.753 m)   Wt 125 lb (56.7 kg)   SpO2 98%   BMI 18.46 kg/m   Advance Care Plan: No documents on file    Physical Exam:  Patient awake, alert.  Cachectic appearing.  Chest clear to auscultation bilaterally.  Clean, intact right chest wall Port-A-Cath.  Heart with regular rate and rhythm.  Abdomen soft, positive bowel sounds, currently nontender; no significant lower extremity edema  Imaging: CT ABDOMEN PELVIS W CONTRAST Result Date: 01/05/2024 CLINICAL DATA:  Abdominal pain, acute, nonlocalized EXAM: CT ABDOMEN AND PELVIS WITH CONTRAST TECHNIQUE: Multidetector CT imaging of the abdomen and pelvis was performed using the standard protocol following bolus administration of intravenous contrast. RADIATION DOSE REDUCTION: This exam was performed according to the departmental dose-optimization program which includes automated exposure control, adjustment of the mA and/or kV according to patient size and/or use of iterative reconstruction technique. CONTRAST:  100mL OMNIPAQUE IOHEXOL 300 MG/ML  SOLN COMPARISON:  October 08, 2023 FINDINGS: Lower chest: No focal airspace consolidation or pleural effusion.Fibrolinear scarring in the lung bases. Partially visualized central venous catheter at the cavoatrial junction. Hepatobiliary: No mass. A couple of small cysts in both hepatic lobes measuring up to 2 cm.No radiopaque stones or wall thickening of the gallbladder.No intrahepatic or extrahepatic biliary ductal dilation.While the portal vein is patent, there is severe narrowing at the portal splenic confluence. Pancreas: Increasing size of the infiltrative mass in the upstream pancreatic body and tail, measuring approximately 4.8 x 7.6 cm (previously, 4.1 by 5 cm, by my  measurement). Additionally, hypoattenuating, infiltrative soft tissue extends into the downstream pancreatic body and neck and extending inferiorly into the root of the mesentery (for example, axial 30), encasing and severely narrowing the jejunal branches and the downstream SMV. Additionally, the soft tissue extends posteriorly and encases the celiac artery causing moderate to severe narrowing of celiac trifurcation and splenic arteries. Spleen: Normal size. No mass. Adrenals/Urinary Tract: No adrenal masses. Unchanged left renal cyst. No mass. No nephrolithiasis or hydronephrosis. The urinary bladder is distended without focal abnormality. Stomach/Bowel: Similarly positioned gastric lap band. Diffuse gastric submucosal edema and wall thickening. No small bowel wall thickening or inflammation. No small bowel obstruction. The appendix was not visualized. No right lower quadrant or pericecal inflammatory changes to suggest acute appendicitis. Moderate volume fecal loading throughout the colon. Vascular/Lymphatic: No aortic aneurysm. Scattered aortoiliac atherosclerosis. Centrally necrotic retrocaval lymph node measuring 1.4 cm (axial 35). Reproductive: Age-related atrophy of the uterus and ovaries. No concerning adnexal mass. Calcified uterine fibroids.No free pelvic fluid. Other: Diffuse mesenteric edema. No pneumoperitoneum. Small volume ascites layering in the pelvis. Significant enlargement of a large peritoneal implant in the left upper quadrant, measuring 5.6 x 4.6 cm (previously 2.1 by 1.5 cm), axial 36. Musculoskeletal: No acute fracture or destructive lesion.Multilevel degenerative disc disease of the spine. Diffuse osteopenia. Thoracic DISH. Diffuse anasarca, worsening. Gastric lap band reservoir in the midline ventral abdominal wall. No discontinuity of the catheter tubing. IMPRESSION: 1. Diffuse gastric wall thickening with submucosal edema, which may be due to hypoproteinemia, acute gastritis, or peptic  ulcer disease. 2. Significant enlargement of the infiltrative pancreatic mass, measuring 4.8 x 7.6 cm (previously, 4.1 x 5 cm), by my measurement. Increasing size of a left upper peritoneal nodule measuring 5.6 x 4.6 cm (previously, 2.1 by 1.5 cm). 3. Developing metastatic retrocaval lymphadenopathy, measuring 1.4 cm (axial 35). 4. Moderate volume fecal loading throughout the colon, as can be seen in constipation. 5. Diffuse anasarca with small volume ascites layering in the pelvis. Electronically Signed   By: Rance Burrows M.D.   On: 01/05/2024 15:54    Labs:  CBC: Recent Labs    10/17/23 0949 11/06/23 1624 01/05/24 1330  01/06/24 0523  WBC 5.7 3.9 5.0 4.4  HGB 11.2* 10.6* 9.7* 9.0*  HCT 36.6 37.5 33.8* 30.9*  PLT 294 305 198 180    COAGS: No results for input(s): "INR", "APTT" in the last 8760 hours.  BMP: Recent Labs    10/13/23 0349 10/17/23 0949 01/05/24 1330 01/06/24 0523  NA 136 137 134* 134*  K 3.0* 3.9 3.3* 3.7  CL 105 99 97* 98  CO2 26 30 25 23   GLUCOSE 117* 192* 256* 234*  BUN <5* 6* 7* 6*  CALCIUM 8.2* 9.0 8.4* 8.2*  CREATININE 0.49 0.64 0.41* 0.43*  GFRNONAA >60 >60 >60 >60    LIVER FUNCTION TESTS: Recent Labs    10/12/23 0727 10/17/23 0949 01/05/24 1330 01/06/24 0523  BILITOT 0.6 0.5 0.9 1.0  AST 17 22 16  12*  ALT 15 19 12 11   ALKPHOS 61 73 61 54  PROT 5.8* 6.5 5.7* 5.3*  ALBUMIN 2.8* 3.4* 2.7* 2.6*    TUMOR MARKERS: No results for input(s): "AFPTM", "CEA", "CA199", "CHROMGRNA" in the last 8760 hours.  Assessment and Plan: 68 y.o. female with past medical history significant for metastatic pancreatic cancer, diabetes, arthritis, depression, hyperlipidemia, anemia was recently admitted to [facility] long with progressive weight loss, poor appetite, poor urine output,  weakness, and persistent abdominal pain.  Patient also followed by palliative medicine team and oncology at [facility] and per their previous notes it was recommended that patient be  considered for celiac plexus block.  Patient currently on Tylenol, Marinol, Duragesic patch, Neurontin, Dilaudid, and oxycodone.  She continues to have moderate upper abdominal discomfort and was seen by palliative care team here.  Request now placed for image guided celiac plexus block.  Patient afebrile, BP okay, WBC normal, hemoglobin 9, platelets normal, creatinine 0.43, PT/INR pending.  Last dose of Eliquis was Sunday morning.  Latest imaging studies have been reviewed by Dr. Darylene Epley.  Details/risks of procedure, including but not limited to, internal bleeding, infection, injury to adjacent structures, inability to eradicate abdominal pain discussed with patient with her understanding and consent.  Procedure tentatively planned for 4/15.  Patient known to IR team from [place] in 2004    Thank you for allowing our service to participate in Jynesis Nakamura 's care.  Electronically Signed: D. Honore Lux, PA-C   01/06/2024, 1:51 PM      I spent a total of 25 minutes    in face to face in clinical consultation, greater than 50% of which was counseling/coordinating care for image guided celiac plexus block

## 2024-01-06 NOTE — Progress Notes (Signed)
 Paged provider   patient here for metastatic pancreatic CA, she received Norco @ [phone], requesting more pain med now or stronger pain med now but I see she does get Midodrine which I will give her now, can she have Tordal maybe?  Awaiting response

## 2024-01-06 NOTE — Plan of Care (Signed)
   Problem: Education: Goal: Ability to describe self-care measures that may prevent or decrease complications (Diabetes Survival Skills Education) will improve Outcome: Progressing Goal: Individualized Educational Video(s) Outcome: Progressing   Problem: Coping: Goal: Ability to adjust to condition or change in health will improve Outcome: Progressing

## 2024-01-06 NOTE — TOC Progression Note (Signed)
 Transition of Care [facility]) - Progression Note    Patient Details  Name: Felicia Garrett MRN: [id number] Date of Birth: October 25, 1955  [facility] CM/SW Contact  Loreda Rodriguez, RN Phone [phone]  01/06/2024, 5:06 PM  Clinical Narrative:    Cm at bedside to follow for disposition planning. Patient and sister confirm that patient does have long term care policy and she would like to go to SNF. Patient gives CM permission to reach out to [facility] to assist with placement and navigating through the long term care insurance aspect of placement. TOC will follow     Barriers to Discharge: [facility] ACT Team  Expected Discharge Plan and Services                                               Social Determinants of Health (SDOH) Interventions SDOH Screenings   Food Insecurity: No Food Insecurity (01/05/2024)  Housing: Low Risk  (01/05/2024)  Transportation Needs: No Transportation Needs (01/05/2024)  Utilities: Not At Risk (01/05/2024)  Alcohol Screen: Low Risk  (04/10/2021)  Depression (PHQ2-9): Low Risk  (07/08/2023)  Financial Resource Strain: Low Risk  (01/02/2024)  Physical Activity: Unknown (01/02/2024)  Social Connections: Socially Isolated (01/06/2024)  Stress: Patient Declined (01/02/2024)  Tobacco Use: Low Risk  (01/05/2024)    Readmission Risk Interventions     No data to display

## 2024-01-07 ENCOUNTER — Inpatient Hospital Stay (HOSPITAL_COMMUNITY)

## 2024-01-07 DIAGNOSIS — R531 Weakness: Secondary | ICD-10-CM | POA: Diagnosis not present

## 2024-01-07 LAB — GLUCOSE, CAPILLARY
Glucose-Capillary: 234 mg/dL — ABNORMAL HIGH (ref 70–99)
Glucose-Capillary: 146 mg/dL — ABNORMAL HIGH (ref 70–99)
Glucose-Capillary: 167 mg/dL — ABNORMAL HIGH (ref 70–99)
Glucose-Capillary: 304 mg/dL — ABNORMAL HIGH (ref 70–99)

## 2024-01-07 MED ORDER — TRIAMCINOLONE ACETONIDE 40 MG/ML IJ SUSP
INTRAMUSCULAR | Status: AC | PRN
  Administered 2024-01-07: 80 mg via INTRADERMAL

## 2024-01-07 MED ORDER — IOHEXOL 300 MG/ML  SOLN
10.0000 mL | Freq: Once | INTRAMUSCULAR | Status: AC | PRN
  Administered 2024-01-07: 10 mL via INTRAVENOUS

## 2024-01-07 MED ORDER — LIDOCAINE HCL 1 % IJ SOLN
INTRAMUSCULAR | Status: AC | PRN
  Administered 2024-01-07: 10 mL via INTRADERMAL

## 2024-01-07 MED ORDER — DIPHENHYDRAMINE HCL 50 MG/ML IJ SOLN
INTRAMUSCULAR | Status: AC | PRN
  Administered 2024-01-07: 50 mg via INTRAVENOUS

## 2024-01-07 MED ORDER — BUPIVACAINE HCL (PF) 0.5 % IJ SOLN
INTRAMUSCULAR | Status: AC | PRN
  Administered 2024-01-07: 30 mL

## 2024-01-07 MED ORDER — MIDAZOLAM HCL 2 MG/2ML IJ SOLN
INTRAMUSCULAR | Status: AC
Start: 2024-01-07 — End: ?
  Filled 2024-01-07: qty 4

## 2024-01-07 MED ORDER — DIPHENHYDRAMINE HCL 50 MG/ML IJ SOLN
INTRAMUSCULAR | Status: AC
  Filled 2024-01-07: qty 1

## 2024-01-07 MED ORDER — FENTANYL CITRATE (PF) 100 MCG/2ML IJ SOLN
INTRAMUSCULAR | Status: AC
  Filled 2024-01-07: qty 4

## 2024-01-07 MED ORDER — TRIAMCINOLONE ACETONIDE 40 MG/ML IJ SUSP
INTRAMUSCULAR | Status: AC
  Filled 2024-01-07: qty 2

## 2024-01-07 MED ORDER — BUPIVACAINE HCL (PF) 0.5 % IJ SOLN
INTRAMUSCULAR | Status: AC
  Filled 2024-01-07: qty 30

## 2024-01-07 MED ORDER — TRIAMCINOLONE ACETONIDE 40 MG/ML IJ SUSP: 80.0000 mg | Freq: Once | INTRAMUSCULAR | Status: DC

## 2024-01-07 MED ORDER — FENTANYL CITRATE (PF) 100 MCG/2ML IJ SOLN
INTRAMUSCULAR | Status: AC | PRN
  Administered 2024-01-07 (×3): 50 ug via INTRAVENOUS

## 2024-01-07 MED ORDER — MIDAZOLAM HCL 2 MG/2ML IJ SOLN
INTRAMUSCULAR | Status: AC | PRN
  Administered 2024-01-07 (×3): 1 mg via INTRAVENOUS

## 2024-01-07 NOTE — Procedures (Signed)
 Vascular and Interventional Radiology Procedure Note  Patient: Felicia Garrett DOB: Jan 02, 1956 Medical Record Number: [id number] Note Date/Time: 01/07/24 12:02 PM   Performing Physician: Art Largo, MD Assistant(s): None  Diagnosis: Pancreatic mass with intractable abdominal pain.    Procedure: CELIAC PLEXUS NERVE BLOCK   Anesthesia: Conscious Sedation Complications: None Estimated Blood Loss: Minimal Specimens: Sent for None   Findings:  Successful CT-guided celiac plexus nerve block, by a bilateral posterior approach. 40 mg/mL Kenalog and 0.5% Bupivacaine was administed Hemostasis of the tract was achieved using Manual Pressure.  Plan: Bed rest for 1 hours.  See detailed procedure note with images in PACS. The patient tolerated the procedure well without incident or complication and was returned to Recovery in stable condition.    Art Largo, MD [facility]   Pager. [phone] Clinic. [phone]

## 2024-01-07 NOTE — Progress Notes (Signed)
 Daily Progress Note   Patient Name: Felicia Garrett       Date: 01/07/2024 DOB: [birth year]  Age: 68 y.o. MRN#: [id number] Attending Physician: Rizwan, Saima, MD Primary Care Physician: Cleave Curling, MD Admit Date: 01/05/2024  Reason for Consultation/Follow-up: Establishing goals of care  Subjective: Awake alert resting in bed, denies nausea, awaiting celiac plexus neurolysis to be done later today.  Medication history reviewed.  Length of Stay: 1  Current Medications: Scheduled Meds:   acetaminophen  1,000 mg Oral Q8H   dronabinol  5 mg Oral BID WC   enoxaparin (LOVENOX) injection  40 mg Subcutaneous Q24H   feeding supplement  237 mL Oral BID BM   fentaNYL  1 patch Transdermal Q72H   gabapentin  100 mg Oral TID   insulin aspart  0-9 Units Subcutaneous TID WC   lactulose  20 g Oral TID   levothyroxine  137 mcg Oral QAC breakfast   midodrine  2.5 mg Oral BID WC   mirtazapine  30 mg Oral QHS   senna  1 tablet Oral BID   triamcinolone acetonide  80 mg Intramuscular Once    Continuous Infusions:   PRN Meds: acetaminophen **OR** acetaminophen, HYDROmorphone (DILAUDID) injection, ondansetron **OR** ondansetron (ZOFRAN) IV, mouth rinse, oxyCODONE  Physical Exam         Awake alert resting in bed No acute distress Regular work of breathing Mild generalized abdominal discomfort evident Flat mood and affect  Vital Signs: BP 118/79 (BP Location: Right Arm)   Pulse 90   Temp 98.4 F (36.9 C) (Oral)   Resp 18   Ht 5\' 9"  (1.753 m)   Wt 56.7 kg   SpO2 97%   BMI 18.46 kg/m  SpO2: SpO2: 97 % O2 Device: O2 Device: Room Air O2 Flow Rate:    Intake/output summary:  Intake/Output Summary (Last 24 hours) at 01/07/2024 1204 Last data filed at 01/07/2024 6295 Gross per 24 hour   Intake 2280 ml  Output 200 ml  Net 2080 ml   LBM: Last BM Date :  (has been a very long time (3wks per previous RN); refusing enema.) Baseline Weight: Weight: 56.7 kg Most recent weight: Weight: 56.7 kg       Palliative Assessment/Data:      Patient Active Problem List   Diagnosis Date Noted  Pancreas cancer (HCC) 01/06/2024   Metastatic malignant neoplasm (HCC) 01/06/2024   Cancer associated pain 01/06/2024   Insomnia 01/06/2024   High risk medication use 01/06/2024   Constipation 01/06/2024   Medication management 01/06/2024   Counseling and coordination of care 01/06/2024   Goals of care, counseling/discussion 01/06/2024   Palliative care encounter 01/06/2024   DNR (do not resuscitate) 01/06/2024   Weakness generalized 01/05/2024   Decreased appetite 11/07/2023   Pure hypercholesterolemia 10/27/2023   Pancreatic cancer (HCC) 10/16/2023   Pancreatic mass 10/11/2023   Pancreatic lesion 10/10/2023   DKA (diabetic ketoacidosis) (HCC) 10/09/2023   Other fatigue 10/04/2023   Estrogen deficiency 04/15/2023   Palpitation 04/15/2023   Impacted cerumen of left ear 04/15/2023   Essential hypertension, benign 04/11/2023   Left ear pain 04/11/2023   Encounter for counseling 01/01/2023   Allergic rhinitis with postnasal drip 03/19/2022   Overweight with body mass index (BMI) of 25 to 25.9 in adult 03/19/2022   Leukopenia 09/14/2021   Uncontrolled type 2 diabetes mellitus with hyperglycemia (HCC) 06/01/2019   Cervical high risk HPV (human papillomavirus) test positive 08/28/2018   Abnormal cervical Papanicolaou smear 08/28/2018   Bacterial vaginosis 08/28/2018   Hyperlipidemia associated with type 2 diabetes mellitus (HCC) 08/28/2018   Goiter 08/28/2018   Anemia 07/03/2018   Type 2 diabetes mellitus with hyperlipidemia (HCC) 07/03/2018   Postsurgical hypothyroidism 10/08/2014   S/P total thyroidectomy 10/08/2014   Thyroid nodule 10/20/2012   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    Code Status:    Code Status Orders  (From admission, onward)           Start     Ordered   01/05/24 1741  Do not attempt resuscitation (DNR)- Limited -Do Not Intubate (DNI)  Continuous       Question Answer Comment  If pulseless and not breathing No CPR or chest compressions.   In Pre-Arrest Conditions (Patient Is Breathing and Has A Pulse) Do not intubate. Provide all appropriate non-invasive medical interventions. Avoid ICU transfer unless indicated or required.   Consent: Discussion documented in EHR or advanced directives reviewed      01/05/24 1741           Code Status History     Date Active Date Inactive Code Status Order ID Comments User Context   10/09/2023 0930 10/13/2023 2103 Full Code [id number]  Hoyt Macleod, MD ED   10/08/2014 1444 10/09/2014 1504 Full Code [id number]  Azucena Bollard, MD Inpatient      Advance Directive Documentation    Flowsheet Row Most Recent Value  Type of Advance Directive Healthcare Power of Attorney  Pre-existing out of facility DNR order (yellow form or pink MOST form) --  "MOST" Form in Place? --       Prognosis:  Unable to determine  Discharge Planning: To Be Determined  Care plan was discussed with patient  Thank you for allowing the Palliative Medicine Team to assist in the care of this patient. MOD MDM     Greater than 50%  of this time was spent counseling and coordinating care related to the above assessment and plan.  Lujean Sake, MD  Please contact Palliative Medicine Team phone at [phone] for  questions and concerns.

## 2024-01-07 NOTE — Progress Notes (Signed)
 Unable to document on [facility] under IV assessment. Port was placed in Emergency room and uncharted by previous RN on 4/13..  It is clean, dry, and intact. Due to be changed on 4/20. Antimicrobial disc and dressing are in place. Connections checked and tubing within date.Felicia Garrett M

## 2024-01-07 NOTE — Progress Notes (Signed)
 [facility] Progress Note  Patient: Felicia Garrett     BJY:[id number]  DOA: 01/05/2024   PCP: Felicia Peng, MD       Brief hospital course:  Felicia Garrett is a 68 y.o. female with medical history of with metastatic pancreatic cancer, DM2 who presents for progressive weight loss, poor appetite, poor urine output and weakness. She is having difficulty ambulating. She has pain in upper abdomen and numbness in her legs. Food tastes bad and she has had to lower her dose of insulin since she is not eating much.   Admitted for pain management and dehydration.    Subjective:  Not eating. Is able to get out of bed to the commode. Pain is relatively controlled on current medications.   Assessment and Plan: Principal Problem:   Weakness generalized - will need to go to a facility as she is unable to get around on her own - TOC consulted  Active Problems:    Pancreatic cancer (HCC)   Decreased appetite - severe weight loss - addressed pain control- added Fentanyl patch- palliative care to follow - oncology also consulted- the patient wants to continue treatments and discuss further options for treatment - Celiac plexus block planned for today  DM2 - treat with low doses of Insulin as oral intake is very poor  Microcytic anemia - Hgb ~ 9    S/P total thyroidectomy - cont synthroid       Code Status: Limited: Do not attempt resuscitation (DNR) -DNR-LIMITED -Do Not Intubate/DNI  Total time on patient care: 35 min DVT prophylaxis:  enoxaparin (LOVENOX) injection 40 mg Start: 01/07/24 2200     Objective:   Vitals:   01/07/24 1215 01/07/24 1220 01/07/24 1225 01/07/24 1230  BP: 128/84 (!) 122/91 113/88 124/89  Pulse: (!) 101 (!) 103 (!) 105 (!) 104  Resp: 11 11 13 10   Temp:      TempSrc:      SpO2: 100% 100% 100% 100%  Weight:      Height:       Filed Weights   01/05/24 1226  Weight: 56.7 kg   Exam: General exam: Appears comfortable  HEENT: oral mucosa  moist Respiratory system: Clear to auscultation.  Cardiovascular system: S1 & S2 heard  Gastrointestinal system: Abdomen soft, non-tender, nondistended. Normal bowel sounds   Extremities: No cyanosis, clubbing or edema Psychiatry:  depressed  CBC: Recent Labs  Lab 01/05/24 1330 01/06/24 0523  WBC 5.0 4.4  NEUTROABS 4.5  --   HGB 9.7* 9.0*  HCT 33.8* 30.9*  MCV 71.9* 71.0*  PLT 198 180   Basic Metabolic Panel: Recent Labs  Lab 01/05/24 1330 01/06/24 0523  NA 134* 134*  K 3.3* 3.7  CL 97* 98  CO2 25 23  GLUCOSE 256* 234*  BUN 7* 6*  CREATININE 0.41* 0.43*  CALCIUM 8.4* 8.2*  MG  --  1.6*     Scheduled Meds:  acetaminophen  1,000 mg Oral Q8H   dronabinol  5 mg Oral BID WC   enoxaparin (LOVENOX) injection  40 mg Subcutaneous Q24H   feeding supplement  237 mL Oral BID BM   fentaNYL  1 patch Transdermal Q72H   gabapentin  100 mg Oral TID   insulin aspart  0-9 Units Subcutaneous TID WC   lactulose  20 g Oral TID   levothyroxine  137 mcg Oral QAC breakfast   midodrine  2.5 mg Oral BID WC   mirtazapine  30 mg Oral QHS   senna  1 tablet Oral BID   triamcinolone acetonide  80 mg Intramuscular Once    Imaging and lab data personally reviewed   Author: Audi Wettstein  01/07/2024 12:35 PM  To contact [facility]>   Check the care team in [facility] and look for the attending/consulting [facility] provider listed  Log into [email or web address] and use [place]'s universal password   Go to> "[facility]"  and find provider  If you still have difficulty reaching the provider, please page the [facility] (Director on Call) for the Hospitalists listed on amion

## 2024-01-08 DIAGNOSIS — R531 Weakness: Secondary | ICD-10-CM | POA: Diagnosis not present

## 2024-01-08 LAB — GLUCOSE, CAPILLARY
Glucose-Capillary: 307 mg/dL — ABNORMAL HIGH (ref 70–99)
Glucose-Capillary: 246 mg/dL — ABNORMAL HIGH (ref 70–99)
Glucose-Capillary: 225 mg/dL — ABNORMAL HIGH (ref 70–99)
Glucose-Capillary: 121 mg/dL — ABNORMAL HIGH (ref 70–99)

## 2024-01-08 MED ORDER — INSULIN GLARGINE-YFGN 100 UNIT/ML ~~LOC~~ SOLN
10.0000 [IU] | Freq: Every day | SUBCUTANEOUS | Status: DC
  Administered 2024-01-08 – 2024-01-10 (×2): 10 [IU] via SUBCUTANEOUS
  Filled 2024-01-08 (×3): qty 0.1

## 2024-01-08 MED ORDER — APIXABAN 5 MG PO TABS
5.0000 mg | ORAL_TABLET | Freq: Two times a day (BID) | ORAL | Status: DC
  Administered 2024-01-08 – 2024-01-10 (×5): 5 mg via ORAL
  Filled 2024-01-08 (×5): qty 1

## 2024-01-08 NOTE — Inpatient Diabetes Management (Signed)
 Inpatient Diabetes Program Recommendations  AACE/ADA: New Consensus Statement on Inpatient Glycemic Control (2015)  Target Ranges:  Prepandial:   less than 140 mg/dL      Peak postprandial:   less than 180 mg/dL (1-2 hours)      Critically ill patients:  140 - 180 mg/dL   Lab Results  Component Value Date   GLUCAP 307 (H) 01/08/2024   HGBA1C 9.9 (H) 10/04/2023    Review of Glycemic Control  Diabetes history: DM2 Outpatient Diabetes medications: Lantus 13 units QHS, Novolog 0-6 TID Current orders for Inpatient glycemic control: Novolog 0-9 TID  Received steroid injection on 4/15 HgbA1C - 9.9% Pt has Libre CGM  Inpatient Diabetes Program Recommendations:    Consider adding Lantus 6 units daily  Continue to follow.  Thank you. Joni Net, RD, LDN, CDCES Inpatient Diabetes Coordinator [phone]

## 2024-01-08 NOTE — TOC Progression Note (Addendum)
 Transition of Care [facility]) - Progression Note    Patient Details  Name: Felicia Garrett MRN: [id number] Date of Birth: [birth year]  Transition of [facility]) CM/SW Contact  Loreda Rodriguez, RN Phone [phone]  01/08/2024, 3:17 PM  Clinical Narrative:    Jolan Natal with [employer] at bedside today to assess patient and assist with discharge planning utilizing long term care policy.  Per Matt patient is considering going home with 24/7 caregivers. Patient is requesting hospice referral . Message has been sent to MD.   [phone] CM has approval from MD to make hospice referral. CM at bedside to offer patient choice. Patient requested referral to be sent to [facility].      Barriers to Discharge: [facility] ACT Team  Expected Discharge Plan and Services                                               Social Determinants of Health (SDOH) Interventions SDOH Screenings   Food Insecurity: No Food Insecurity (01/05/2024)  Housing: Low Risk  (01/05/2024)  Transportation Needs: No Transportation Needs (01/05/2024)  Utilities: Not At Risk (01/05/2024)  Alcohol Screen: Low Risk  (04/10/2021)  Depression (PHQ2-9): Low Risk  (07/08/2023)  Financial Resource Strain: Low Risk  (01/02/2024)  Physical Activity: Unknown (01/02/2024)  Social Connections: Socially Isolated (01/06/2024)  Stress: Patient Declined (01/02/2024)  Tobacco Use: Low Risk  (01/05/2024)    Readmission Risk Interventions     No data to display

## 2024-01-08 NOTE — Plan of Care (Signed)
  Problem: Education: Goal: Ability to describe self-care measures that may prevent or decrease complications (Diabetes Survival Skills Education) will improve Outcome: Progressing Goal: Individualized Educational Video(s) Outcome: Progressing   Problem: Coping: Goal: Ability to adjust to condition or change in health will improve Outcome: Progressing   Problem: Fluid Volume: Goal: Ability to maintain a balanced intake and output will improve Outcome: Progressing   Problem: Health Behavior/Discharge Planning: Goal: Ability to identify and utilize available resources and services will improve Outcome: Progressing Goal: Ability to manage health-related needs will improve Outcome: Progressing   Problem: Metabolic: Goal: Ability to maintain appropriate glucose levels will improve Outcome: Progressing   Problem: Nutritional: Goal: Progress toward achieving an optimal weight will improve Outcome: Progressing   Problem: Skin Integrity: Goal: Risk for impaired skin integrity will decrease Outcome: Progressing   Problem: Tissue Perfusion: Goal: Adequacy of tissue perfusion will improve Outcome: Progressing   Problem: Education: Goal: Knowledge of General Education information will improve Description: Including pain rating scale, medication(s)/side effects and non-pharmacologic comfort measures Outcome: Progressing   Problem: Health Behavior/Discharge Planning: Goal: Ability to manage health-related needs will improve Outcome: Progressing   Problem: Clinical Measurements: Goal: Ability to maintain clinical measurements within normal limits will improve Outcome: Progressing Goal: Will remain free from infection Outcome: Progressing Goal: Diagnostic test results will improve Outcome: Progressing Goal: Respiratory complications will improve Outcome: Progressing Goal: Cardiovascular complication will be avoided Outcome: Progressing   Problem: Activity: Goal: Risk for  activity intolerance will decrease Outcome: Progressing   Problem: Nutrition: Goal: Adequate nutrition will be maintained Outcome: Progressing   Problem: Coping: Goal: Level of anxiety will decrease Outcome: Progressing   Problem: Elimination: Goal: Will not experience complications related to bowel motility Outcome: Progressing Goal: Will not experience complications related to urinary retention Outcome: Progressing   Problem: Pain Managment: Goal: General experience of comfort will improve and/or be controlled Outcome: Progressing   Problem: Safety: Goal: Ability to remain free from injury will improve Outcome: Progressing   Problem: Skin Integrity: Goal: Risk for impaired skin integrity will decrease Outcome: Progressing

## 2024-01-08 NOTE — Progress Notes (Signed)
 PROGRESS NOTE    Felicia Garrett  ZOX:[id number] DOB: 12-27-1955 DOA: 01/05/2024 PCP: [name], MD   Brief Narrative:  68 year old female with history of metastatic pancreatic cancer, diabetes mellitus type 2 presented with progressive weakness, poor appetite, weakness and poor urine output along with difficulty ambulation.  She was admitted for pain management and dehydration.  Oncology/palliative care and IR were consulted.  She underwent celiac plexus nerve block on 01/07/2024 by IR.  Assessment & Plan:   Metastatic pancreatic cancer Cancer-related pain Failure to thrive/weight loss Generalized weakness History of hypertension thrombosis - Has had 3 cycles of chemotherapy as an outpatient and follows with [facility] oncology.  Oncology/Dr. Mosetta Putt following: If patient does not recover well, she would not be able to get more chemotherapy.  If condition were to worsen, patient would benefit from hospice. - Palliative care following: Helping with pain management. - underwent celiac plexus nerve block on 01/07/2024 by IR. - TOC following for possible SNF placement. - Resume Eliquis  Diabetes mellitus type 2 with hyperglycemia - Continue CBGs with SSI  Hypothyroidism--continue Synthroid.  History of total thyroidectomy  Hyponatremia Mild.  Monitor intermittently  Hypokalemia - Resolved.  No labs today  Microcytic anemia--questionable cause.  Hemoglobin currently stable.  Hypomagnesemia--no labs today   DVT prophylaxis: Lovenox.  Resume Eliquis. Code Status: DNR Family Communication: None at bedside Disposition Plan: Status is: Inpatient Remains inpatient appropriate because: Of severity of illness    Consultants: Oncology/palliative care/IR  Procedures: As above  Antimicrobials: None   Subjective: Patient seen and examined at bedside.  Oral intake remains poor.  Still complains of intermittent pain but improving.  No fever, agitation reported.  Objective: Vitals:    01/07/24 1250 01/07/24 1420 01/07/24 2200 01/08/24 0500  BP: 95/79 91/65 (!) 92/59 122/81  Pulse: (!) 102 91 98 (!) 109  Resp: 12 16 18 18   Temp:  (!) 97.5 F (36.4 C) 97.7 F (36.5 C) 98.4 F (36.9 C)  TempSrc:  Oral Oral Oral  SpO2: 100% 97% 99% 100%  Weight:      Height:        Intake/Output Summary (Last 24 hours) at 01/08/2024 0746 Last data filed at 01/07/2024 0834 Gross per 24 hour  Intake --  Output 200 ml  Net -200 ml   Filed Weights   01/05/24 1226  Weight: 56.7 kg    Examination:  General exam: Appears calm and comfortable.  Looks chronically ill and deconditioned. Respiratory system: Bilateral decreased breath sounds at bases Cardiovascular system: S1 & S2 heard, Rate controlled Gastrointestinal system: Abdomen is nondistended, soft and nontender. Normal bowel sounds heard. Extremities: No cyanosis, clubbing, edema  Central nervous system: Alert and oriented.  Slow to respond.  Poor historian.  No focal neurological deficits. Moving extremities Skin: No rashes, lesions or ulcers Psychiatry: Flat affect.  Not agitated    Data Reviewed: I have personally reviewed following labs and imaging studies  CBC: Recent Labs  Lab 01/05/24 1330 01/06/24 0523  WBC 5.0 4.4  NEUTROABS 4.5  --   HGB 9.7* 9.0*  HCT 33.8* 30.9*  MCV 71.9* 71.0*  PLT 198 180   Basic Metabolic Panel: Recent Labs  Lab 01/05/24 1330 01/06/24 0523  NA 134* 134*  K 3.3* 3.7  CL 97* 98  CO2 25 23  GLUCOSE 256* 234*  BUN 7* 6*  CREATININE 0.41* 0.43*  CALCIUM 8.4* 8.2*  MG  --  1.6*   GFR: Estimated Creatinine Clearance: 59.4 mL/min (A) (by C-G formula  based on SCr of 0.43 mg/dL (L)). Liver Function Tests: Recent Labs  Lab 01/05/24 1330 01/06/24 0523  AST 16 12*  ALT 12 11  ALKPHOS 61 54  BILITOT 0.9 1.0  PROT 5.7* 5.3*  ALBUMIN 2.7* 2.6*   Recent Labs  Lab 01/05/24 1330  LIPASE 23   No results for input(s): "AMMONIA" in the last 168 hours. Coagulation  Profile: Recent Labs  Lab 01/06/24 1723  INR 1.3*   Cardiac Enzymes: No results for input(s): "CKTOTAL", "CKMB", "CKMBINDEX", "TROPONINI" in the last 168 hours. BNP (last 3 results) No results for input(s): "PROBNP" in the last 8760 hours. HbA1C: No results for input(s): "HGBA1C" in the last 72 hours. CBG: Recent Labs  Lab 01/06/24 2137 01/07/24 0744 01/07/24 1424 01/07/24 1657 01/07/24 2203  GLUCAP 217* 234* 146* 167* 304*   Lipid Profile: No results for input(s): "CHOL", "HDL", "LDLCALC", "TRIG", "CHOLHDL", "LDLDIRECT" in the last 72 hours. Thyroid Function Tests: No results for input(s): "TSH", "T4TOTAL", "FREET4", "T3FREE", "THYROIDAB" in the last 72 hours. Anemia Panel: No results for input(s): "VITAMINB12", "FOLATE", "FERRITIN", "TIBC", "IRON", "RETICCTPCT" in the last 72 hours. Sepsis Labs: No results for input(s): "PROCALCITON", "LATICACIDVEN" in the last 168 hours.  No results found for this or any previous visit (from the past 240 hours).       Radiology Studies: CT CELIAC PLEXUS BLOCK ANESTHETIC Result Date: 01/07/2024 INDICATION: Pancreatic cancer with intractable abdominal pain. EXAM: CT-GUIDED CELIAC PLEXUS NERVE BLOCK, DIAGNOSTIC AND THERAPEUTIC COMPARISON:  CT AP, 01/05/2024.  PET-CT, 11/01/2023. MEDICATIONS: 80 mg Kenalog and 30 mL 0.5% bupivacaine. 25 mg Benadryl IV. ANESTHESIA/SEDATION: Moderate (conscious) sedation was employed during this procedure. A total of Versed 3 mg and Fentanyl 150 mcg was administered intravenously. Moderate Sedation Time: 48 minutes. The patient's level of consciousness and vital signs were monitored continuously by radiology nursing throughout the procedure under my direct supervision. CONTRAST:  None. FLUOROSCOPY TIME:  CT dose; 373 mGycm COMPLICATIONS: None immediate. PROCEDURE: RADIATION DOSE REDUCTION: This exam was performed according to the departmental dose-optimization program which includes automated exposure control,  adjustment of the mA and/or kV according to patient size and/or use of iterative reconstruction technique. Informed consent was obtained from the patient following an explanation of the procedure, risks, benefits and alternatives. A time out was performed prior to the initiation of the procedure. The patient was positioned prone on the CT table and a limited CT was performed for procedural planning demonstrating an adequate window at the upper abdomen. The procedure was planned. A timeout was performed prior to the initiation of the procedure. The operative site was prepped and draped in the usual sterile fashion. Appropriate trajectory was confirmed with a 22 gauge spinal needle after the adjacent tissues were anesthetized with 1% Lidocaine with epinephrine. Under intermittent CT guidance, 15 cm 21-gauge Chiba needles were inserted via a posterior approach and the needle tips were positioned in the retroperitoneal space immediately anterolateral to the aorta, at the region of the celiac trunk. A small amount of dilute contrast was injected through the needles to confirm position. A solution containing 2 mL of 40 mg/mL Kenalog and 15 mL of Bupivacaine was mixed and injected through each needle. Intermittent CT scanning confirmed appropriate spread into the extra vascular spaces. The needles were removed and hemostasis was achieved with manual compression. A limited postprocedural CT was negative for hemorrhage or additional complication. A dressing was placed. The patient tolerated the procedure well without immediate postprocedural complication. IMPRESSION: Successful CT-guided diagnostic and therapeutic  celiac plexus nerve block, as above. PLAN: The patient will be re-evaluated in the postprocedural setting and again in 1-3 months to assess the efficacy of this nerve block. If successful, the patient may be a candidate for future celiac plexus nerve blocks versus neurolysis. Art Largo, MD [facility] Electronically Signed   By: Art Largo M.D.   On: 01/07/2024 13:39        Scheduled Meds:  acetaminophen  1,000 mg Oral Q8H   dronabinol  5 mg Oral BID WC   enoxaparin (LOVENOX) injection  40 mg Subcutaneous Q24H   feeding supplement  237 mL Oral BID BM   fentaNYL  1 patch Transdermal Q72H   gabapentin  100 mg Oral TID   insulin aspart  0-9 Units Subcutaneous TID WC   lactulose  20 g Oral TID   levothyroxine  137 mcg Oral QAC breakfast   midodrine  2.5 mg Oral BID WC   mirtazapine  30 mg Oral QHS   senna  1 tablet Oral BID   Continuous Infusions:        Audria Leather, MD [facility] 01/08/2024, 7:46 AM

## 2024-01-08 NOTE — Progress Notes (Signed)
 Patient ID: Felicia Garrett, female   DOB: 1955/10/22, 68 y.o.   MRN: [id number]    Referring Physician(s): Mims,L   Supervising Physician: Art Largo  Patient Status:  [facility] - In-pt  Chief Complaint:  Metastatic pancreatic cancer, persistent abdominal pain, s/p celiac plexus block 01/07/24  Subjective:  Pt reports significant improvement in pain today. Before procedure rated pain 7/10, this morning after her dose of dilaudid, reports pain is "almost gone". No additional complaints.  Allergies: Penicillins  Medications: Prior to Admission medications   Medication Sig Start Date End Date Taking? Authorizing Provider  acetaminophen (TYLENOL) 500 MG tablet Take 1,000 mg by mouth in the morning, at noon, and at bedtime.   Yes [provider]  apixaban (ELIQUIS) 5 MG TABS tablet Take 5 mg by mouth 2 (two) times daily. 12/04/23 12/03/24 Yes [provider]  atorvastatin (LIPITOR) 40 MG tablet TAKE 1 TABLET BY MOUTH EVERY DAY Patient taking differently: Take 40 mg by mouth every evening. 08/19/23  Yes Cleave Curling, MD  benzonatate (TESSALON) 200 MG capsule Take 1 capsule (200 mg total) by mouth 3 (three) times daily as needed for cough. 10/13/23  Yes Rai, Ripudeep K, MD  Continuous Glucose Sensor (FREESTYLE LIBRE 3 SENSOR) MISC Place 1 sensor on the skin every 14 days. Use to check glucose continuously 10/28/23  Yes Cleave Curling, MD  dexamethasone (DECADRON) 4 MG tablet Take 4 mg by mouth daily. 11/20/23  Yes [provider]  dronabinol (MARINOL) 5 MG capsule Take 5 mg by mouth 2 (two) times daily before a meal. 01/01/24 01/31/24 Yes [provider]  DRYSOL 20 % external solution APPLY TO AFFECTED AREA EVERY DAY AS NEEDED 01/01/24  Yes Cleave Curling, MD  gabapentin (NEURONTIN) 100 MG capsule Take 100 mg by mouth See admin instructions. Take 1 cap by mouth at bedtime for 3 days, then 1 cap bid for 3 days, then 1 cap tid for 24 days. 01/03/24 02/02/24 Yes [provider]  insulin aspart (NOVOLOG) 100 UNIT/ML FlexPen Inject 0-6 Units into the skin 3 (three) times daily with meals. Check Blood Glucose (BG) and inject per scale: BG <150= 0 unit; BG 150-200= 1 unit; BG 201-250= 2 unit; BG 251-300= 3 unit; BG 301-350= 4 unit; BG 351-400= 5 unit; BG >400= 6 unit and Call Primary Care. 10/13/23  Yes Rai, Ripudeep K, MD  insulin glargine (LANTUS) 100 UNIT/ML Solostar Pen Inject 13 Units into the skin at bedtime. May substitute as needed per insurance. 10/13/23  Yes Rai, Ripudeep K, MD  Iron-FA-B Cmp-C-Biot-Probiotic (FUSION PLUS) CAPS TAKE 1 CAPSULE BY MOUTH ONCE DAILY BETWEEN MEALS Patient taking differently: Take 1 capsule by mouth 2 (two) times a week. 01/14/23  Yes Cleave Curling, MD  levothyroxine (SYNTHROID) 137 MCG tablet TAKE 1 TABLET BY MOUTH DAILY BEFORE BREAKFAST. 12/16/23  Yes Cleave Curling, MD  lidocaine-prilocaine (EMLA) cream Apply to affected area once 10/17/23  Yes Sonja [place], MD  methocarbamol (ROBAXIN) 750 MG tablet TAKE 1 TABLET BY MOUTH 3 TIMES DAILY. Patient taking differently: Take 750 mg by mouth 3 (three) times daily as needed for muscle spasms. 08/08/22  Yes Cleave Curling, MD  midodrine (PROAMATINE) 5 MG tablet Take 5 mg by mouth 3 (three) times daily as needed. 11/20/23 11/19/24 Yes [provider]  mirtazapine (REMERON) 30 MG tablet TAKE 0.5 TABLETS (15 MG TOTAL) BY MOUTH AT BEDTIME. 12/02/23 12/01/24 Yes Cleave Curling, MD  OLANZapine (ZYPREXA) 5 MG tablet Take 5 mg by mouth at bedtime. 12/31/23  Yes [provider]  ondansetron (ZOFRAN) 8 MG tablet Take 1 tablet (8 mg total) by mouth every 8 (eight) hours as needed for nausea or vomiting. Start on the third day after chemotherapy 10/17/23  Yes Malachy Mood, MD  oxyCODONE (OXY IR/ROXICODONE) 5 MG immediate release tablet Take 5-10 mg by mouth every 4 (four) hours as needed.   Yes [provider]  Pancrelipase, Lip-Prot-Amyl, 24000-76000 units CPEP Take 2 capsules by  mouth 3 (three) times daily with meals. 12/23/23 12/22/24 Yes [provider]  polyethylene glycol powder (GLYCOLAX/MIRALAX) 17 GM/SCOOP powder Take 17 g by mouth daily as needed for moderate constipation. 01/03/24  Yes [provider]  senna (SENOKOT) 8.6 MG tablet Take 2 tablets by mouth 2 (two) times daily. May increase as needed up to 8 tabs per day 01/03/24  Yes [provider]  sodium fluoride (PREVIDENT 5000 PLUS) 1.1 % CREA dental cream Place 1 Application onto teeth 2 (two) times a week.   Yes [provider]  triamcinolone cream (KENALOG) 0.1 % APPLY TO AFFECTED AREA TWICE DAILY AS NEEDED 10/29/23  Yes [name], MD  Blood Glucose Monitoring Suppl DEVI 1 each by Does not apply route 3 (three) times daily. May dispense any manufacturer covered by patient's insurance. 10/13/23   Rai, Ripudeep K, MD  buPROPion (WELLBUTRIN XL) 150 MG 24 hr tablet TAKE 1 TABLET BY MOUTH EVERY DAY Patient not taking: Reported on 11/06/2023 08/19/23   [name], MD  Glucose Blood (BLOOD GLUCOSE TEST STRIPS) STRP 1 each by Does not apply route 3 (three) times daily. Use as directed to check blood sugar. May dispense any manufacturer covered by patient's insurance and fits patient's device. 10/13/23   Rai, Delene Ruffini, MD  HYDROcodone-acetaminophen (NORCO/VICODIN) 5-325 MG tablet Take 1 tablet by mouth every 6 (six) hours as needed for moderate pain (pain score 4-6). Patient not taking: Reported on 01/05/2024 11/06/23   [name], MD  Insulin Pen Needle (PEN NEEDLES) 31G X 5 MM MISC 1 each by Does not apply route 3 (three) times daily. May dispense any manufacturer covered by patient's insurance. 10/13/23   Rai, Delene Ruffini, MD  Lancet Device MISC 1 each by Does not apply route 3 (three) times daily. May dispense any manufacturer covered by patient's insurance. 10/13/23   Rai, Delene Ruffini, MD  Lancets MISC 1 each by Does not apply route 3 (three) times daily. Use as directed to check  blood sugar. May dispense any manufacturer covered by patient's insurance and fits patient's device. 10/13/23   Rai, Delene Ruffini, MD     Vital Signs: BP 122/81 (BP Location: Left Arm)   Pulse (!) 109   Temp 98.4 F (36.9 C) (Oral)   Resp 18   Ht 5\' 9"  (1.753 m)   Wt 125 lb (56.7 kg)   SpO2 100%   BMI 18.46 kg/m   Physical Exam Vitals and nursing note reviewed.  Constitutional:      Appearance: Normal appearance.     Comments: Thin, chronically ill appearing  HENT:     Mouth/Throat:     Mouth: Mucous membranes are moist.     Pharynx: Oropharynx is clear.  Cardiovascular:     Rate and Rhythm: Normal rate and regular rhythm.  Pulmonary:     Effort: Pulmonary effort is normal.     Breath sounds: Normal breath sounds.  Abdominal:     Palpations: Abdomen is soft.     Comments: + mild generalized abdominal tenderness -  chronic  Musculoskeletal:     Right lower leg: No edema.     Left lower leg: No edema.     Comments: 2 needle insertion sites on back unremarkable. No overlying abnormality. No pain at sites.  Skin:    General: Skin is warm and dry.  Neurological:     General: No focal deficit present.     Mental Status: She is alert and oriented to person, place, and time. Mental status is at baseline.     Imaging: CT CELIAC PLEXUS BLOCK ANESTHETIC Result Date: 01/07/2024 INDICATION: Pancreatic cancer with intractable abdominal pain. EXAM: CT-GUIDED CELIAC PLEXUS NERVE BLOCK, DIAGNOSTIC AND THERAPEUTIC COMPARISON:  CT AP, 01/05/2024.  PET-CT, 11/01/2023. MEDICATIONS: 80 mg Kenalog and 30 mL 0.5% bupivacaine. 25 mg Benadryl IV. ANESTHESIA/SEDATION: Moderate (conscious) sedation was employed during this procedure. A total of Versed 3 mg and Fentanyl 150 mcg was administered intravenously. Moderate Sedation Time: 48 minutes. The patient's level of consciousness and vital signs were monitored continuously by radiology nursing throughout the procedure under my direct supervision.  CONTRAST:  None. FLUOROSCOPY TIME:  CT dose; 373 mGycm COMPLICATIONS: None immediate. PROCEDURE: RADIATION DOSE REDUCTION: This exam was performed according to the departmental dose-optimization program which includes automated exposure control, adjustment of the mA and/or kV according to patient size and/or use of iterative reconstruction technique. Informed consent was obtained from the patient following an explanation of the procedure, risks, benefits and alternatives. A time out was performed prior to the initiation of the procedure. The patient was positioned prone on the CT table and a limited CT was performed for procedural planning demonstrating an adequate window at the upper abdomen. The procedure was planned. A timeout was performed prior to the initiation of the procedure. The operative site was prepped and draped in the usual sterile fashion. Appropriate trajectory was confirmed with a 22 gauge spinal needle after the adjacent tissues were anesthetized with 1% Lidocaine with epinephrine. Under intermittent CT guidance, 15 cm 21-gauge Chiba needles were inserted via a posterior approach and the needle tips were positioned in the retroperitoneal space immediately anterolateral to the aorta, at the region of the celiac trunk. A small amount of dilute contrast was injected through the needles to confirm position. A solution containing 2 mL of 40 mg/mL Kenalog and 15 mL of Bupivacaine was mixed and injected through each needle. Intermittent CT scanning confirmed appropriate spread into the extra vascular spaces. The needles were removed and hemostasis was achieved with manual compression. A limited postprocedural CT was negative for hemorrhage or additional complication. A dressing was placed. The patient tolerated the procedure well without immediate postprocedural complication. IMPRESSION: Successful CT-guided diagnostic and therapeutic celiac plexus nerve block, as above. PLAN: The patient will be  re-evaluated in the postprocedural setting and again in 1-3 months to assess the efficacy of this nerve block. If successful, the patient may be a candidate for future celiac plexus nerve blocks versus neurolysis. Art Largo, MD [facility] Radiology Electronically Signed   By: Art Largo M.D.   On: 01/07/2024 13:39   CT ABDOMEN PELVIS W CONTRAST Result Date: 01/05/2024 CLINICAL DATA:  Abdominal pain, acute, nonlocalized EXAM: CT ABDOMEN AND PELVIS WITH CONTRAST TECHNIQUE: Multidetector CT imaging of the abdomen and pelvis was performed using the standard protocol following bolus administration of intravenous contrast. RADIATION DOSE REDUCTION: This exam was performed according to the departmental dose-optimization program which includes automated exposure control, adjustment of the mA and/or kV according to patient  size and/or use of iterative reconstruction technique. CONTRAST:  OMNIPAQUE IOHEXOL 300 MG/ML  SOLN COMPARISON:  October 08, 2023 FINDINGS: Lower chest: No focal airspace consolidation or pleural effusion.Fibrolinear scarring in the lung bases. Partially visualized central venous catheter at the cavoatrial junction. Hepatobiliary: No mass. A couple of small cysts in both hepatic lobes measuring up to 2 cm.No radiopaque stones or wall thickening of the gallbladder.No intrahepatic or extrahepatic biliary ductal dilation.While the portal vein is patent, there is severe narrowing at the portal splenic confluence. Pancreas: Increasing size of the infiltrative mass in the upstream pancreatic body and tail, measuring approximately 4.8 x 7.6 cm (previously, 4.1 by 5 cm, by my measurement). Additionally, hypoattenuating, infiltrative soft tissue extends into the downstream pancreatic body and neck and extending inferiorly into the root of the mesentery (for example, axial 30), encasing and severely narrowing the jejunal branches and the downstream SMV.  Additionally, the soft tissue extends posteriorly and encases the celiac artery causing moderate to severe narrowing of celiac trifurcation and splenic arteries. Spleen: Normal size. No mass. Adrenals/Urinary Tract: No adrenal masses. Unchanged left renal cyst. No mass. No nephrolithiasis or hydronephrosis. The urinary bladder is distended without focal abnormality. Stomach/Bowel: Similarly positioned gastric lap band. Diffuse gastric submucosal edema and wall thickening. No small bowel wall thickening or inflammation. No small bowel obstruction. The appendix was not visualized. No right lower quadrant or pericecal inflammatory changes to suggest acute appendicitis. Moderate volume fecal loading throughout the colon. Vascular/Lymphatic: No aortic aneurysm. Scattered aortoiliac atherosclerosis. Centrally necrotic retrocaval lymph node measuring 1.4 cm (axial 35). Reproductive: Age-related atrophy of the uterus and ovaries. No concerning adnexal mass. Calcified uterine fibroids.No free pelvic fluid. Other: Diffuse mesenteric edema. No pneumoperitoneum. Small volume ascites layering in the pelvis. Significant enlargement of a large peritoneal implant in the left upper quadrant, measuring 5.6 x 4.6 cm (previously 2.1 by 1.5 cm), axial 36. Musculoskeletal: No acute fracture or destructive lesion.Multilevel degenerative disc disease of the spine. Diffuse osteopenia. Thoracic DISH. Diffuse anasarca, worsening. Gastric lap band reservoir in the midline ventral abdominal wall. No discontinuity of the catheter tubing. IMPRESSION: 1. Diffuse gastric wall thickening with submucosal edema, which may be due to hypoproteinemia, acute gastritis, or peptic ulcer disease. 2. Significant enlargement of the infiltrative pancreatic mass, measuring 4.8 x 7.6 cm (previously, 4.1 x 5 cm), by my measurement. Increasing size of a left upper peritoneal nodule measuring 5.6 x 4.6 cm (previously, 2.1 by 1.5 cm). 3. Developing metastatic  retrocaval lymphadenopathy, measuring 1.4 cm (axial 35). 4. Moderate volume fecal loading throughout the colon, as can be seen in constipation. 5. Diffuse anasarca with small volume ascites layering in the pelvis. Electronically Signed   By: Wallie Char M.D.   On: 01/05/2024 15:54    Labs:  CBC: Recent Labs    10/17/23 0949 11/06/23 1624 01/05/24 1330 01/06/24 0523  WBC 5.7 3.9 5.0 4.4  HGB 11.2* 10.6* 9.7* 9.0*  HCT 36.6 37.5 33.8* 30.9*  PLT 294 305 198 180    COAGS: Recent Labs    01/06/24 1723  INR 1.3*    BMP: Recent Labs    10/13/23 0349 10/17/23 0949 01/05/24 1330 01/06/24 0523  NA 136 137 134* 134*  K 3.0* 3.9 3.3* 3.7  CL 105 99 97* 98  CO2 26 30 25 23   GLUCOSE 117* 192* 256* 234*  BUN <5* 6* 7* 6*  CALCIUM 8.2* 9.0 8.4* 8.2*  CREATININE 0.49 0.64 0.41* 0.43*  GFRNONAA >60 >60 >60 >60  LIVER FUNCTION TESTS: Recent Labs    10/12/23 0727 10/17/23 0949 01/05/24 1330 01/06/24 0523  BILITOT 0.6 0.5 0.9 1.0  AST 17 22 16  12*  ALT 15 19 12 11   ALKPHOS 61 73 61 54  PROT 5.8* 6.5 5.7* 5.3*  ALBUMIN 2.8* 3.4* 2.7* 2.6*    Assessment and Plan:  Day 1 post celiac nerve plexus block  - significant pain improvement, reports pain "almost gone" with her regular dilaudid dose this morning - surgical sites on back unremarkable. No pain, no overlying abnormality.  IR will continue to follow as needed. Recommend continuing to follow patients pain level. Please call with any further questions or concerns.  Electronically Signed: Nicolasa Barrett, PA-C 01/08/2024, 8:38 AM   I spent a total of 15 Minutes at the the patient's bedside AND on the patient's hospital floor or unit, greater than 50% of which was counseling/coordinating care for celiac plexus block.

## 2024-01-08 NOTE — Plan of Care (Signed)
  Problem: Education: Goal: Ability to describe self-care measures that may prevent or decrease complications (Diabetes Survival Skills Education) will improve Outcome: Not Progressing Goal: Individualized Educational Video(s) Outcome: Not Progressing   Problem: Coping: Goal: Ability to adjust to condition or change in health will improve Outcome: Not Progressing   Problem: Fluid Volume: Goal: Ability to maintain a balanced intake and output will improve Outcome: Not Progressing   Problem: Health Behavior/Discharge Planning: Goal: Ability to identify and utilize available resources and services will improve Outcome: Not Progressing Goal: Ability to manage health-related needs will improve Outcome: Not Progressing   Problem: Metabolic: Goal: Ability to maintain appropriate glucose levels will improve Outcome: Not Progressing   Problem: Nutritional: Goal: Maintenance of adequate nutrition will improve Outcome: Not Progressing Goal: Progress toward achieving an optimal weight will improve Outcome: Not Progressing   Problem: Skin Integrity: Goal: Risk for impaired skin integrity will decrease Outcome: Not Progressing   Problem: Tissue Perfusion: Goal: Adequacy of tissue perfusion will improve Outcome: Not Progressing   Problem: Education: Goal: Knowledge of General Education information will improve Description: Including pain rating scale, medication(s)/side effects and non-pharmacologic comfort measures Outcome: Not Progressing   Problem: Health Behavior/Discharge Planning: Goal: Ability to manage health-related needs will improve Outcome: Not Progressing   Problem: Clinical Measurements: Goal: Ability to maintain clinical measurements within normal limits will improve Outcome: Not Progressing Goal: Will remain free from infection Outcome: Not Progressing Goal: Diagnostic test results will improve Outcome: Not Progressing Goal: Respiratory complications will  improve Outcome: Not Progressing Goal: Cardiovascular complication will be avoided Outcome: Not Progressing   Problem: Pain Managment: Goal: General experience of comfort will improve and/or be controlled Outcome: Not Progressing   Problem: Skin Integrity: Goal: Risk for impaired skin integrity will decrease Outcome: Not Progressing

## 2024-01-08 NOTE — Progress Notes (Signed)
 Daily Progress Note   Patient Name: Felicia Garrett       Date: 01/08/2024 DOB: 05-08-1956  Age: 68 y.o. MRN#: [id number] Attending Physician: Audria Leather, MD Primary Care Physician: Cleave Curling, MD Admit Date: 01/05/2024  Reason for Consultation/Follow-up: Establishing goals of care  Subjective: Awake alert resting in bed, on 01-07-24 she underwent celiac plexus neurolysis  Also on TDF 25 mcg Q 72 hours.  Pain reasonably well controlled, no nausea, lack of appetite continues.  Length of Stay: 2  Current Medications: Scheduled Meds:   acetaminophen  1,000 mg Oral Q8H   dronabinol  5 mg Oral BID WC   enoxaparin (LOVENOX) injection  40 mg Subcutaneous Q24H   feeding supplement  237 mL Oral BID BM   fentaNYL  1 patch Transdermal Q72H   gabapentin  100 mg Oral TID   insulin aspart  0-9 Units Subcutaneous TID WC   lactulose  20 g Oral TID   levothyroxine  137 mcg Oral QAC breakfast   midodrine  2.5 mg Oral BID WC   mirtazapine  30 mg Oral QHS   senna  1 tablet Oral BID    Continuous Infusions:   PRN Meds: acetaminophen **OR** acetaminophen, HYDROmorphone (DILAUDID) injection, ondansetron **OR** ondansetron (ZOFRAN) IV, mouth rinse, oxyCODONE  Physical Exam         Awake alert resting in bed No acute distress Regular work of breathing Mild generalized abdominal discomfort evident Flat mood and affect  Vital Signs: BP 122/81 (BP Location: Left Arm)   Pulse (!) 109   Temp 98.4 F (36.9 C) (Oral)   Resp 18   Ht 5\' 9"  (1.753 m)   Wt 56.7 kg   SpO2 100%   BMI 18.46 kg/m  SpO2: SpO2: 100 % O2 Device: O2 Device: Room Air O2 Flow Rate: O2 Flow Rate (L/min): 2 L/min  Intake/output summary: No intake or output data in the 24 hours ending 01/08/24 0838  LBM: Last BM Date  : 01/07/24 Baseline Weight: Weight: 56.7 kg Most recent weight: Weight: 56.7 kg       Palliative Assessment/Data:      Patient Active Problem List   Diagnosis Date Noted   Pancreas cancer (HCC) 01/06/2024   Metastatic malignant neoplasm (HCC) 01/06/2024   Cancer associated pain 01/06/2024   Insomnia 01/06/2024  High risk medication use 01/06/2024   Constipation 01/06/2024   Medication management 01/06/2024   Counseling and coordination of care 01/06/2024   Goals of care, counseling/discussion 01/06/2024   Palliative care encounter 01/06/2024   DNR (do not resuscitate) 01/06/2024   Weakness generalized 01/05/2024   Decreased appetite 11/07/2023   Pure hypercholesterolemia 10/27/2023   Pancreatic cancer (HCC) 10/16/2023   Pancreatic mass 10/11/2023   Pancreatic lesion 10/10/2023   DKA (diabetic ketoacidosis) (HCC) 10/09/2023   Other fatigue 10/04/2023   Estrogen deficiency 04/15/2023   Palpitation 04/15/2023   Impacted cerumen of left ear 04/15/2023   Essential hypertension, benign 04/11/2023   Left ear pain 04/11/2023   Encounter for counseling 01/01/2023   Allergic rhinitis with postnasal drip 03/19/2022   Overweight with body mass index (BMI) of 25 to 25.9 in adult 03/19/2022   Leukopenia 09/14/2021   Uncontrolled type 2 diabetes mellitus with hyperglycemia (HCC) 06/01/2019   Cervical high risk HPV (human papillomavirus) test positive 08/28/2018   Abnormal cervical Papanicolaou smear 08/28/2018   Bacterial vaginosis 08/28/2018   Hyperlipidemia associated with type 2 diabetes mellitus (HCC) 08/28/2018   Goiter 08/28/2018   Anemia 07/03/2018   Type 2 diabetes mellitus with hyperlipidemia (HCC) 07/03/2018   Postsurgical hypothyroidism 10/08/2014   S/P total thyroidectomy 10/08/2014   Thyroid nodule 10/20/2012   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    Code Status:    Code Status Orders  (From admission, onward)           Start     Ordered   01/05/24 1741  Do not attempt resuscitation (DNR)- Limited -Do Not Intubate (DNI)  Continuous       Question Answer Comment  If pulseless and not breathing No CPR or chest compressions.   In Pre-Arrest Conditions (Patient Is Breathing and Has A Pulse) Do not intubate. Provide all appropriate non-invasive medical interventions. Avoid ICU transfer unless indicated or required.   Consent: Discussion documented in EHR or advanced directives reviewed      01/05/24 1741           Code Status History     Date Active Date Inactive Code Status Order ID Comments User Context   10/09/2023 0930 10/13/2023 2103 Full Code [id number]  Lorin Glass, MD ED   10/08/2014 1444 10/09/2014 1504 Full Code [id number]  Valarie Merino, MD Inpatient      Advance Directive Documentation    Flowsheet Row Most Recent Value  Type of Advance Directive Healthcare Power of Attorney  Pre-existing out of facility DNR order (yellow form or pink MOST form) --  "MOST" Form in Place? --       Prognosis:  Unable to determine  Discharge Planning: To Be Determined  Care plan was discussed with patient  Thank you for allowing the Palliative Medicine Team to assist in the care of this patient. MOD MDM     Greater than 50%  of this time was spent counseling and coordinating care related to the above assessment and plan.  Rosalin Hawking, MD  Please contact Palliative Medicine Team phone at [phone] for questions and concerns.

## 2024-01-08 NOTE — Progress Notes (Signed)
 Oncology brief note  Patient has decided to go home with hospice care.  I support her decision.  She is still very weak, needs a personal assistance to use bedside commode.  Her appetite is low, but she is eating and drinking some.  I answered all her questions.  Sonja [place]  01/08/2024

## 2024-01-09 DIAGNOSIS — R531 Weakness: Secondary | ICD-10-CM | POA: Diagnosis not present

## 2024-01-09 LAB — GLUCOSE, CAPILLARY
Glucose-Capillary: 145 mg/dL — ABNORMAL HIGH (ref 70–99)
Glucose-Capillary: 106 mg/dL — ABNORMAL HIGH (ref 70–99)
Glucose-Capillary: 153 mg/dL — ABNORMAL HIGH (ref 70–99)
Glucose-Capillary: 134 mg/dL — ABNORMAL HIGH (ref 70–99)

## 2024-01-09 MED ORDER — SODIUM CHLORIDE 0.9 % IV SOLN: INTRAVENOUS | Status: DC

## 2024-01-09 MED ORDER — MEGESTROL ACETATE 400 MG/10ML PO SUSP
600.0000 mg | Freq: Every day | ORAL | Status: DC
Start: 2024-01-09 — End: 2024-01-10
  Administered 2024-01-09 – 2024-01-10 (×2): 600 mg via ORAL
  Filled 2024-01-09 (×2): qty 20

## 2024-01-09 NOTE — TOC Progression Note (Addendum)
 Transition of Care [employer] [facility]) - Progression Note    Patient Details  Name: Felicia Garrett MRN: [id number] Date of Birth: September 30, 1955  Transition of Care [facility]) CM/SW Contact  Loreda Rodriguez, RN Phone Number:[phone]  01/09/2024, 1:41 PM  Clinical Narrative:    Cm at bedside per patients request. CM at bedside and patient confirms that she wants to go home with Hospice. Patient verbalizes that she wishes to utilize her LTC plan for caregivers every other day. CM made patient aware that CM would reach out to [employer] to have agency come in to assess patient. Patient verbalized understanding and was in agreement. CM has spoke with Zoila Hines from [employer]. [facility] to come to bedside for assessments and assist with setting up caregivers.  1600 CM received call to make CM aware that patient does not want to set up caregivers through [place]. CM at bedside with patient and sister. Patient states that Hospice has told her that they can provide needed services and she does not want caregivers for duplicate services. Patient defers all decisions about caregivers to her sister. Sister has been provided that number to call Jeanelle Milch if needed. Sister and patient agree that they will discuss and make a decision. TOC will continue to follow.      Barriers to Discharge: [facility] ACT Team  Expected Discharge Plan and Services                                               Social Determinants of Health (SDOH) Interventions SDOH Screenings   Food Insecurity: No Food Insecurity (01/05/2024)  Housing: Low Risk  (01/05/2024)  Transportation Needs: No Transportation Needs (01/05/2024)  Utilities: Not At Risk (01/05/2024)  Alcohol Screen: Low Risk  (04/10/2021)  Depression (PHQ2-9): Low Risk  (07/08/2023)  Financial Resource Strain: Low Risk  (01/02/2024)  Physical Activity: Unknown (01/02/2024)  Social Connections: Socially Isolated (01/06/2024)  Stress: Patient  Declined (01/02/2024)  Tobacco Use: Low Risk  (01/05/2024)    Readmission Risk Interventions     No data to display

## 2024-01-09 NOTE — Progress Notes (Signed)
 PROGRESS NOTE    Felicia Garrett  ZOX:[id number] DOB: 02-Jan-1956 DOA: 01/05/2024 PCP: [name], MD   Brief Narrative:  68 year old female with history of metastatic pancreatic cancer, diabetes mellitus type 2 presented with progressive weakness, poor appetite, weakness and poor urine output along with difficulty ambulation.  She was admitted for pain management and dehydration.  Oncology/palliative care and IR were consulted.  She underwent celiac plexus nerve block on 01/07/2024 by IR.  Patient decided to pursue home hospice on 01/08/2024.  TOC following.  Assessment & Plan:   Metastatic pancreatic cancer Cancer-related pain Failure to thrive/weight loss Generalized weakness History of hypertension thrombosis - Has had 3 cycles of chemotherapy as an outpatient and follows with [facility] onc[facility].  Oncology/Dr. Mosetta Putt following: If patient does not recover well, she would not be able to get more chemotherapy.  If condition were to worsen, patient would benefit from hospice. - Palliative care following: Helping with pain management. - underwent celiac plexus nerve block on 01/07/2024 by IR. - Patient decided to pursue home hospice on 01/08/2024.  TOC following.  Today she is not sure about the same and wants to speak to Dr. Feng/oncology.  Follow oncology recommendations. - Continue Eliquis  Diabetes mellitus type 2 with hyperglycemia - Continue CBGs with SSI  Hypothyroidism--continue Synthroid.  History of total thyroidectomy  Hyponatremia -Will avoid further labs since patient has decided to pursue home hospice  Hypokalemia - Plan as above  Microcytic anemia--questionable cause.  Hemoglobin currently stable.  Hypomagnesemia--plan as above  DVT prophylaxis: Eliquis. Code Status: DNR Family Communication: None at bedside Disposition Plan: Status is: Inpatient Remains inpatient appropriate because: Of severity of illness    Consultants: Oncology/palliative care/IR  Procedures:  As above  Antimicrobials: None   Subjective: Patient seen and examined at bedside.  No seizures, fever or vomiting reported.  Still has intermittent pain but pain medications are helping.  Today she is not sure about her decision to pursue home hospice and wants to speak to Dr. Mosetta Putt.  States that she is not being much and wants to be started on IV fluids. Objective: Vitals:   01/08/24 0500 01/08/24 1323 01/08/24 2217 01/09/24 0521  BP: 122/81 113/71 106/72 119/83  Pulse: (!) 109 (!) 103 95 96  Resp: 18 16 18 18   Temp: 98.4 F (36.9 C) 98.8 F (37.1 C) 98.8 F (37.1 C) 97.8 F (36.6 C)  TempSrc: Oral Oral Oral Oral  SpO2: 100% 100% 98% 100%  Weight:      Height:        Intake/Output Summary (Last 24 hours) at 01/09/2024 0756 Last data filed at 01/08/2024 1100 Gross per 24 hour  Intake --  Output 150 ml  Net -150 ml   Filed Weights   01/05/24 1226  Weight: 56.7 kg    Examination:  General exam: Chronically ill and deconditioned looking.  No distress.  Flat affect.  Not agitated. Respiratory system: Decreased breath sounds at bases bilaterally, no wheezing  cardiovascular system: Mild intermittent tachycardia present; S1 and S2 heard  gastrointestinal system: Abdomen is mildly distended, soft and nontender.  bowel sounds heard. Extremities: Trace lower extremity edema; no clubbing  Data Reviewed: I have personally reviewed following labs and imaging studies  CBC: Recent Labs  Lab 01/05/24 1330 01/06/24 0523  WBC 5.0 4.4  NEUTROABS 4.5  --   HGB 9.7* 9.0*  HCT 33.8* 30.9*  MCV 71.9* 71.0*  PLT 198 180   Basic Metabolic Panel: Recent Labs  Lab 01/05/24 1330  01/06/24 0523  NA 134* 134*  K 3.3* 3.7  CL 97* 98  CO2 25 23  GLUCOSE 256* 234*  BUN 7* 6*  CREATININE 0.41* 0.43*  CALCIUM 8.4* 8.2*  MG  --  1.6*   GFR: Estimated Creatinine Clearance: 59.4 mL/min (A) (by C-G formula based on SCr of 0.43 mg/dL (L)). Liver Function Tests: Recent Labs  Lab  01/05/24 1330 01/06/24 0523  AST 16 12*  ALT 12 11  ALKPHOS 61 54  BILITOT 0.9 1.0  PROT 5.7* 5.3*  ALBUMIN 2.7* 2.6*   Recent Labs  Lab 01/05/24 1330  LIPASE 23   No results for input(s): "AMMONIA" in the last 168 hours. Coagulation Profile: Recent Labs  Lab 01/06/24 1723  INR 1.3*   Cardiac Enzymes: No results for input(s): "CKTOTAL", "CKMB", "CKMBINDEX", "TROPONINI" in the last 168 hours. BNP (last 3 results) No results for input(s): "PROBNP" in the last 8760 hours. HbA1C: No results for input(s): "HGBA1C" in the last 72 hours. CBG: Recent Labs  Lab 01/08/24 0801 01/08/24 1128 01/08/24 1614 01/08/24 2214 01/09/24 0743  GLUCAP 307* 246* 225* 121* 145*   Lipid Profile: No results for input(s): "CHOL", "HDL", "LDLCALC", "TRIG", "CHOLHDL", "LDLDIRECT" in the last 72 hours. Thyroid Function Tests: No results for input(s): "TSH", "T4TOTAL", "FREET4", "T3FREE", "THYROIDAB" in the last 72 hours. Anemia Panel: No results for input(s): "VITAMINB12", "FOLATE", "FERRITIN", "TIBC", "IRON", "RETICCTPCT" in the last 72 hours. Sepsis Labs: No results for input(s): "PROCALCITON", "LATICACIDVEN" in the last 168 hours.  No results found for this or any previous visit (from the past 240 hours).       Radiology Studies: CT CELIAC PLEXUS BLOCK ANESTHETIC Result Date: 01/07/2024 INDICATION: Pancreatic cancer with intractable abdominal pain. EXAM: CT-GUIDED CELIAC PLEXUS NERVE BLOCK, DIAGNOSTIC AND THERAPEUTIC COMPARISON:  CT AP, 01/05/2024.  PET-CT, 11/01/2023. MEDICATIONS: 80 mg Kenalog and 30 mL 0.5% bupivacaine. 25 mg Benadryl IV. ANESTHESIA/SEDATION: Moderate (conscious) sedation was employed during this procedure. A total of Versed 3 mg and Fentanyl 150 mcg was administered intravenously. Moderate Sedation Time: 48 minutes. The patient's level of consciousness and vital signs were monitored continuously by radiology nursing throughout the procedure under my direct supervision.  CONTRAST:  None. FLUOROSCOPY TIME:  CT dose; 373 mGycm COMPLICATIONS: None immediate. PROCEDURE: RADIATION DOSE REDUCTION: This exam was performed according to the departmental dose-optimization program which includes automated exposure control, adjustment of the mA and/or kV according to patient size and/or use of iterative reconstruction technique. Informed consent was obtained from the patient following an explanation of the procedure, risks, benefits and alternatives. A time out was performed prior to the initiation of the procedure. The patient was positioned prone on the CT table and a limited CT was performed for procedural planning demonstrating an adequate window at the upper abdomen. The procedure was planned. A timeout was performed prior to the initiation of the procedure. The operative site was prepped and draped in the usual sterile fashion. Appropriate trajectory was confirmed with a 22 gauge spinal needle after the adjacent tissues were anesthetized with 1% Lidocaine with epinephrine. Under intermittent CT guidance, 15 cm 21-gauge Chiba needles were inserted via a posterior approach and the needle tips were positioned in the retroperitoneal space immediately anterolateral to the aorta, at the region of the celiac trunk. A small amount of dilute contrast was injected through the needles to confirm position. A solution containing 2 mL of 40 mg/mL Kenalog and 15 mL of Bupivacaine was mixed and injected through each needle. Intermittent  CT scanning confirmed appropriate spread into the extra vascular spaces. The needles were removed and hemostasis was achieved with manual compression. A limited postprocedural CT was negative for hemorrhage or additional complication. A dressing was placed. The patient tolerated the procedure well without immediate postprocedural complication. IMPRESSION: Successful CT-guided diagnostic and therapeutic celiac plexus nerve block, as above. PLAN: The patient will be  re-evaluated in the postprocedural setting and again in 1-3 months to assess the efficacy of this nerve block. If successful, the patient may be a candidate for future celiac plexus nerve blocks versus neurolysis. Art Largo, MD [facility] Electronically Signed   By: Art Largo M.D.   On: 01/07/2024 13:39        Scheduled Meds:  acetaminophen  1,000 mg Oral Q8H   apixaban  5 mg Oral BID   dronabinol  5 mg Oral BID WC   feeding supplement  237 mL Oral BID BM   fentaNYL  1 patch Transdermal Q72H   gabapentin  100 mg Oral TID   insulin aspart  0-9 Units Subcutaneous TID WC   insulin glargine-yfgn  10 Units Subcutaneous Daily   lactulose  20 g Oral TID   levothyroxine  137 mcg Oral QAC breakfast   midodrine  2.5 mg Oral BID WC   mirtazapine  30 mg Oral QHS   senna  1 tablet Oral BID   Continuous Infusions:        Audria Leather, MD [facility] 01/09/2024, 7:56 AM

## 2024-01-09 NOTE — Progress Notes (Signed)
 Felicia Garrett [address] [facility] Liaison Note  Received request from [facility] for hospice services at home after discharge. Spoke with patient to initiate education related to hospice philosophy, services and team approach to care. Patient verbalized understanding of information given. Per discussion, [name] is undecided about accepting hospice services at this time and wishes to speak with her oncologist again.   DME needs discussed. Patient has the following equipment in the home: air mattress, rolling walker, BSC, shower chair. Patient requests the following equipment for delivery: none at this time.  [facility] information and contact numbers given to patient. We will await further instruction from oncologist. Please call with any concerns.  Thank you for the opportunity to participate in this patient's care.   Ardine Beckwith, LPN [facility] Liaison [phone]

## 2024-01-09 NOTE — Plan of Care (Signed)

## 2024-01-09 NOTE — Progress Notes (Signed)
 Mobility Specialist - Progress Note   01/09/24 1002  Mobility  Activity Moved into chair position in bed  Activity Response Tolerated well  Mobility Referral Yes  Mobility visit 1 Mobility  Mobility Specialist Start Time (ACUTE ONLY) 0956  Mobility Specialist Stop Time (ACUTE ONLY) 1001  Mobility Specialist Time Calculation (min) (ACUTE ONLY) 5 min   Pt received in bed declining mobility. Pt stated she was too weak to walk. Refused transferring to the recliner but agreeable to have bed placed in chair position.  No complaints during session. Pt to bed after session with all needs met.   [facility]

## 2024-01-09 NOTE — Progress Notes (Signed)
 Daily Progress Note   Patient Name: Felicia Garrett       Date: 01/09/2024 DOB: [birth year]  Age: 68 y.o. MRN#: [id number] Attending Physician: Audria Leather, MD Primary Care Physician: Cleave Curling, MD Admit Date: 01/05/2024  Reason for Consultation/Follow-up: Establishing goals of care  Subjective: Awake alert resting in bed, on 01-07-24 she underwent celiac plexus neurolysis  Also on TDF 25 mcg Q 72 hours.  Pain reasonably well controlled, no nausea, lack of appetite continues. We had a lot of discussion today about whether or not this is the time to pursue hospice care.  Length of Stay: 3  Current Medications: Scheduled Meds:   acetaminophen  1,000 mg Oral Q8H   apixaban  5 mg Oral BID   dronabinol  5 mg Oral BID WC   feeding supplement  237 mL Oral BID BM   fentaNYL  1 patch Transdermal Q72H   gabapentin  100 mg Oral TID   insulin aspart  0-9 Units Subcutaneous TID WC   insulin glargine-yfgn  10 Units Subcutaneous Daily   lactulose  20 g Oral TID   levothyroxine  137 mcg Oral QAC breakfast   midodrine  2.5 mg Oral BID WC   mirtazapine  30 mg Oral QHS   senna  1 tablet Oral BID    Continuous Infusions:  sodium chloride 75 mL/hr at 01/09/24 1019    PRN Meds: acetaminophen **OR** acetaminophen, HYDROmorphone (DILAUDID) injection, ondansetron **OR** ondansetron (ZOFRAN) IV, mouth rinse, oxyCODONE  Physical Exam         Awake alert resting in bed No acute distress Regular work of breathing Mild generalized abdominal discomfort evident Flat mood and affect  Vital Signs: BP 119/83 (BP Location: Right Arm)   Pulse 96   Temp 97.8 F (36.6 C) (Oral)   Resp 18   Ht 5\' 9"  (1.753 m)   Wt 56.7 kg   SpO2 100%   BMI 18.46 kg/m  SpO2: SpO2: 100 % O2 Device: O2 Device: Room  Air O2 Flow Rate: O2 Flow Rate (L/min): 2 L/min  Intake/output summary: No intake or output data in the 24 hours ending 01/09/24 1154  LBM: Last BM Date : 01/08/24 Baseline Weight: Weight: 56.7 kg Most recent weight: Weight: 56.7 kg       Palliative Assessment/Data:  Patient Active Problem List   Diagnosis Date Noted   Pancreas cancer (HCC) 01/06/2024   Metastatic malignant neoplasm (HCC) 01/06/2024   Cancer associated pain 01/06/2024   Insomnia 01/06/2024   High risk medication use 01/06/2024   Constipation 01/06/2024   Medication management 01/06/2024   Counseling and coordination of care 01/06/2024   Goals of care, counseling/discussion 01/06/2024   Palliative care encounter 01/06/2024   DNR (do not resuscitate) 01/06/2024   Weakness generalized 01/05/2024   Decreased appetite 11/07/2023   Pure hypercholesterolemia 10/27/2023   Pancreatic cancer (HCC) 10/16/2023   Pancreatic mass 10/11/2023   Pancreatic lesion 10/10/2023   DKA (diabetic ketoacidosis) (HCC) 10/09/2023   Other fatigue 10/04/2023   Estrogen deficiency 04/15/2023   Palpitation 04/15/2023   Impacted cerumen of left ear 04/15/2023   Essential hypertension, benign 04/11/2023   Left ear pain 04/11/2023   Encounter for counseling 01/01/2023   Allergic rhinitis with postnasal drip 03/19/2022   Overweight with body mass index (BMI) of 25 to 25.9 in adult 03/19/2022   Leukopenia 09/14/2021   Uncontrolled type 2 diabetes mellitus with hyperglycemia (HCC) 06/01/2019   Cervical high risk HPV (human papillomavirus) test positive 08/28/2018   Abnormal cervical Papanicolaou smear 08/28/2018   Bacterial vaginosis 08/28/2018   Hyperlipidemia associated with type 2 diabetes mellitus (HCC) 08/28/2018   Goiter 08/28/2018   Anemia 07/03/2018   Type 2 diabetes mellitus with hyperlipidemia (HCC) 07/03/2018   Postsurgical hypothyroidism 10/08/2014   S/P total thyroidectomy 10/08/2014   Thyroid nodule 10/20/2012    Obesity status post 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 09/05/2011    Palliative Care Assessment & Plan   Patient Profile:    Assessment: Generalized weakness Abdominal discomfort Pancreatic cancer Functional decline Cancer related cachexia   Recommendations/Plan: S/p celiac axis neurolysis Monitor current pain and non-- pain symptom management options. Discussed with the patient about hospice care.  Patient states that she would like to speak with medical oncology again.  She is wondering if she ought to go to [facility] next week and keep her medical oncologist appointment.  Alternatively, she would like to rediscuss with Dr. Maryalice Smaller with regards to continue with further treatments or to pursue hospice care.  I spent some time today explaining to her about hospice philosophy of care.  Additionally, a hospice liaison has also established contact with the patient.  Offered active listening and supportive empathic presence.  Acknowledged the difficulty of making such a decision.  Explained to the patient that the palliative team will continue to follow along and be available as an extra layer of support.    Code Status:    Code Status Orders  (From admission, onward)           Start     Ordered   01/05/24 1741  Do not attempt resuscitation (DNR)- Limited -Do Not Intubate (DNI)  Continuous       Question Answer Comment  If pulseless and not breathing No CPR or chest compressions.   In Pre-Arrest Conditions (Patient Is Breathing and Has A Pulse) Do not intubate. Provide all appropriate non-invasive medical interventions. Avoid ICU transfer unless indicated or required.   Consent: Discussion documented in EHR or advanced directives reviewed      01/05/24 1741           Code Status History     Date Active Date Inactive Code Status Order ID Comments User Context   10/09/2023 0930 10/13/2023 2103 Full Code [id number]  Hoyt Macleod, MD ED   10/08/2014 1444  10/09/2014 1504 Full Code [id number]  Azucena Bollard, MD Inpatient      Advance Directive Documentation    Flowsheet Row Most Recent Value  Type of Advance Directive Healthcare Power of Attorney  Pre-existing out of facility DNR order (yellow form or pink MOST form) --  "MOST" Form in Place? --       Prognosis:  Unable to determine  Discharge Planning: To Be Determined  Care plan was discussed with patient  Thank you for allowing the Palliative Medicine Team to assist in the care of this patient. MOD MDM     Greater than 50%  of this time was spent counseling and coordinating care related to the above assessment and plan.  Lujean Sake, MD  Please contact Palliative Medicine Team phone at [phone] for questions and concerns.

## 2024-01-10 DIAGNOSIS — R531 Weakness: Secondary | ICD-10-CM | POA: Diagnosis not present

## 2024-01-10 LAB — GLUCOSE, CAPILLARY
Glucose-Capillary: 209 mg/dL — ABNORMAL HIGH (ref 70–99)
Glucose-Capillary: 196 mg/dL — ABNORMAL HIGH (ref 70–99)

## 2024-01-10 MED ORDER — METHOCARBAMOL 750 MG PO TABS: 750.0000 mg | ORAL_TABLET | Freq: Three times a day (TID) | ORAL | Status: DC | PRN

## 2024-01-10 MED ORDER — FENTANYL 25 MCG/HR TD PT72: 1.0000 | MEDICATED_PATCH | TRANSDERMAL | 0 refills | Status: DC

## 2024-01-10 MED ORDER — FUSION PLUS PO CAPS: 1.0000 | ORAL_CAPSULE | ORAL | Status: DC

## 2024-01-10 MED ORDER — LACTULOSE 10 GM/15ML PO SOLN: 20.0000 g | Freq: Three times a day (TID) | ORAL | 0 refills | Status: DC

## 2024-01-10 MED ORDER — MEGESTROL ACETATE 400 MG/10ML PO SUSP: 600.0000 mg | Freq: Every day | ORAL | 0 refills | Status: DC

## 2024-01-10 MED ORDER — OXYCODONE HCL 10 MG PO TABS: 10.0000 mg | ORAL_TABLET | ORAL | 0 refills | Status: DC | PRN

## 2024-01-10 NOTE — Plan of Care (Signed)

## 2024-01-10 NOTE — Progress Notes (Signed)
 Daily Progress Note   Patient Name: Felicia Garrett       Date: 01/10/2024 DOB: 1956/08/29  Age: 68 y.o. MRN#: [id number] Attending Physician: Audria Leather, MD Primary Care Physician: Cleave Curling, MD Admit Date: 01/05/2024  Reason for Consultation/Follow-up: Establishing goals of care  Subjective: Awake alert resting in bed, complains of pain, wants to be discharged home with hospice today.   Length of Stay: 4  Current Medications: Scheduled Meds:   acetaminophen   1,000 mg Oral Q8H   apixaban   5 mg Oral BID   dronabinol   5 mg Oral BID WC   feeding supplement  237 mL Oral BID BM   fentaNYL   1 patch Transdermal Q72H   gabapentin   100 mg Oral TID   insulin  aspart  0-9 Units Subcutaneous TID WC   insulin  glargine-yfgn  10 Units Subcutaneous Daily   lactulose   20 g Oral TID   levothyroxine   137 mcg Oral QAC breakfast   megestrol   600 mg Oral Daily   midodrine   2.5 mg Oral BID WC   mirtazapine   30 mg Oral QHS   senna  1 tablet Oral BID    Continuous Infusions:  sodium chloride  75 mL/hr at 01/09/24 1019    PRN Meds: acetaminophen  **OR** acetaminophen , HYDROmorphone  (DILAUDID ) injection, ondansetron  **OR** ondansetron  (ZOFRAN ) IV, mouth rinse, oxyCODONE   Physical Exam         Awake alert resting in bed mild acute distress Regular work of breathing Mild generalized abdominal discomfort evident Flat mood and affect  Vital Signs: BP 123/80 (BP Location: Right Arm)   Pulse 100   Temp 98.7 F (37.1 C) (Oral)   Resp 16   Ht 5\' 9"  (1.753 m)   Wt 56.7 kg   SpO2 100%   BMI 18.46 kg/m  SpO2: SpO2: 100 % O2 Device: O2 Device: Room Air O2 Flow Rate: O2 Flow Rate (L/min): 2 L/min  Intake/output summary:  Intake/Output Summary (Last 24 hours) at 01/10/2024 1002 Last data  filed at 01/09/2024 1623 Gross per 24 hour  Intake 622.41 ml  Output 50 ml  Net 572.41 ml    LBM: Last BM Date : (P) 01/07/24 Baseline Weight: Weight: 56.7 kg Most recent weight: Weight: 56.7 kg       Palliative Assessment/Data:      Patient Active Problem List  Diagnosis Date Noted   Pancreas cancer (HCC) 01/06/2024   Metastatic malignant neoplasm (HCC) 01/06/2024   Cancer associated pain 01/06/2024   Insomnia 01/06/2024   High risk medication use 01/06/2024   Constipation 01/06/2024   Medication management 01/06/2024   Counseling and coordination of care 01/06/2024   Goals of care, counseling/discussion 01/06/2024   Palliative care encounter 01/06/2024   DNR (do not resuscitate) 01/06/2024   Weakness generalized 01/05/2024   Decreased appetite 11/07/2023   Pure hypercholesterolemia 10/27/2023   Pancreatic cancer (HCC) 10/16/2023   Pancreatic mass 10/11/2023   Pancreatic lesion 10/10/2023   DKA (diabetic ketoacidosis) (HCC) 10/09/2023   Other fatigue 10/04/2023   Estrogen deficiency 04/15/2023   Palpitation 04/15/2023   Impacted cerumen of left ear 04/15/2023   Essential hypertension, benign 04/11/2023   Left ear pain 04/11/2023   Encounter for counseling 01/01/2023   Allergic rhinitis with postnasal drip 03/19/2022   Overweight with body mass index (BMI) of 25 to 25.9 in adult 03/19/2022   Leukopenia 09/14/2021   Uncontrolled type 2 diabetes mellitus with hyperglycemia (HCC) 06/01/2019   Cervical high risk HPV (human papillomavirus) test positive 08/28/2018   Abnormal cervical Papanicolaou smear 08/28/2018   Bacterial vaginosis 08/28/2018   Hyperlipidemia associated with type 2 diabetes mellitus (HCC) 08/28/2018   Goiter 08/28/2018   Anemia 07/03/2018   Type 2 diabetes mellitus with hyperlipidemia (HCC) 07/03/2018   Postsurgical hypothyroidism 10/08/2014   S/P total thyroidectomy 10/08/2014   Thyroid  nodule 10/20/2012   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 Home with hospice care today.     Code Status:    Code Status Orders  (From admission, onward)           Start     Ordered   01/05/24 1741  Do not attempt resuscitation (DNR)- Limited -Do Not Intubate (DNI)  Continuous       Question Answer Comment  If pulseless and not breathing No CPR or chest compressions.   In Pre-Arrest Conditions (Patient Is Breathing and Has A Pulse) Do not intubate. Provide all appropriate non-invasive medical interventions. Avoid ICU transfer unless indicated or required.   Consent: Discussion documented in EHR or advanced directives reviewed      01/05/24 1741           Code Status History     Date Active Date Inactive Code Status Order ID Comments User Context   10/09/2023 0930 10/13/2023 2103 Full Code [id number]  Hoyt Macleod, MD ED   10/08/2014 1444 10/09/2014 1504 Full Code [id number]  Azucena Bollard, MD Inpatient      Advance Directive Documentation    Flowsheet Row Most Recent Value  Type of Advance Directive Healthcare Power of Attorney  Pre-existing out of facility DNR order (yellow form or pink MOST form) --  "MOST" Form in Place? --       Prognosis:  ?less than 3 months.   Discharge Planning: Home with hospice.   Care plan was discussed with patient RN [facility] TOC  Thank you for allowing the Palliative Medicine Team to assist in the care of this patient. MOD MDM     Greater than 50%  of this time was spent counseling and coordinating care related to the above assessment  and plan.  Lujean Sake, MD  Please contact Palliative Medicine Team phone at [phone] for questions and concerns.

## 2024-01-10 NOTE — Discharge Summary (Signed)
 Physician Discharge Summary  Felicia Garrett BJY:[id number] DOB: 01-12-1956 DOA: 01/05/2024  PCP: Cleave Curling, MD  Admit date: 01/05/2024 Discharge date: 01/10/2024  Admitted From: Home Disposition: Home with hospice  Recommendations for Outpatient Follow-up:  Follow up with home hospice at earliest convenience  Home Health: No Equipment/Devices: None  Discharge Condition: Poor CODE STATUS: DNR Diet recommendation: Carb modified  Brief/Interim Summary: 68 year old female with history of metastatic pancreatic cancer, diabetes mellitus type 2 presented with progressive weakness, poor appetite, weakness and poor urine output along with difficulty ambulation.  She was admitted for pain management and dehydration.  Oncology/palliative care and IR were consulted.  She underwent celiac plexus nerve block on 01/07/2024 by IR.  Patient decided to pursue home hospice on 01/08/2024.  She will be discharged home with hospice once arrangements have been made.  Discharge Diagnoses:   Metastatic pancreatic cancer Cancer-related pain Failure to thrive/weight loss Generalized weakness History of hypertension thrombosis - Has had 3 cycles of chemotherapy as an outpatient and follows with [facility].   - Oncology follow-up appreciated.  Palliative care following: Helping with pain management. - underwent celiac plexus nerve block on 01/07/2024 by IR. - Patient decided to pursue home hospice.  She will be discharged home with hospice once arrangements have been made. - Continue Eliquis    Diabetes mellitus type 2 with hyperglycemia - Continue carb modified diet and home regimen.   Hypothyroidism--continue Synthroid .  History of total thyroidectomy   Hyponatremia -Will avoid further labs since patient has decided to pursue home hospice   Hypokalemia - Plan as above   Microcytic anemia--questionable cause.  Hemoglobin currently stable.   Hypomagnesemia--plan as above  Discharge  Instructions  Discharge Instructions     Diet Carb Modified   Complete by: As directed    Increase activity slowly   Complete by: As directed    No wound care   Complete by: As directed       Allergies as of 01/10/2024       Reactions   Penicillins Shortness Of Breath   Chest pain        Medication List     STOP taking these medications    buPROPion  150 MG 24 hr tablet Commonly known as: WELLBUTRIN  XL   HYDROcodone -acetaminophen  5-325 MG tablet Commonly known as: NORCO/VICODIN       TAKE these medications    acetaminophen  500 MG tablet Commonly known as: TYLENOL  Take 1,000 mg by mouth in the morning, at noon, and at bedtime.   apixaban  5 MG Tabs tablet Commonly known as: ELIQUIS  Take 5 mg by mouth 2 (two) times daily.   atorvastatin  40 MG tablet Commonly known as: LIPITOR TAKE 1 TABLET BY MOUTH EVERY DAY What changed: when to take this   benzonatate  200 MG capsule Commonly known as: TESSALON  Take 1 capsule (200 mg total) by mouth 3 (three) times daily as needed for cough.   Blood Glucose Monitoring Suppl Devi 1 each by Does not apply route 3 (three) times daily. May dispense any manufacturer covered by patient's insurance.   BLOOD GLUCOSE TEST STRIPS Strp 1 each by Does not apply route 3 (three) times daily. Use as directed to check blood sugar. May dispense any manufacturer covered by patient's insurance and fits patient's device.   dexamethasone  4 MG tablet Commonly known as: DECADRON  Take 4 mg by mouth daily.   dronabinol  5 MG capsule Commonly known as: MARINOL  Take 5 mg by mouth 2 (two) times daily before a meal.  Drysol 20 % external solution Generic drug: aluminum chloride APPLY TO AFFECTED AREA EVERY DAY AS NEEDED   fentaNYL  25 MCG/HR Commonly known as: DURAGESIC  Place 1 patch onto the skin every 3 (three) days. Start taking on: January 12, 2024   FreeStyle Libre 3 Sensor Misc Place 1 sensor on the skin every 14 days. Use to check  glucose continuously   Fusion Plus Caps Take 1 capsule by mouth 2 (two) times a week. Start taking on: January 13, 2024   gabapentin  100 MG capsule Commonly known as: NEURONTIN  Take 100 mg by mouth See admin instructions. Take 1 cap by mouth at bedtime for 3 days, then 1 cap bid for 3 days, then 1 cap tid for 24 days.   insulin  aspart 100 UNIT/ML FlexPen Commonly known as: NOVOLOG  Inject 0-6 Units into the skin 3 (three) times daily with meals. Check Blood Glucose (BG) and inject per scale: BG <150= 0 unit; BG 150-200= 1 unit; BG 201-250= 2 unit; BG 251-300= 3 unit; BG 301-350= 4 unit; BG 351-400= 5 unit; BG >400= 6 unit and Call Primary Care.   insulin  glargine 100 UNIT/ML Solostar Pen Commonly known as: LANTUS  Inject 13 Units into the skin at bedtime. May substitute as needed per insurance.   lactulose  10 GM/15ML solution Commonly known as: CHRONULAC  Take 30 mLs (20 g total) by mouth 3 (three) times daily.   Lancet Device Misc 1 each by Does not apply route 3 (three) times daily. May dispense any manufacturer covered by patient's insurance.   Lancets Misc 1 each by Does not apply route 3 (three) times daily. Use as directed to check blood sugar. May dispense any manufacturer covered by patient's insurance and fits patient's device.   levothyroxine  137 MCG tablet Commonly known as: SYNTHROID  TAKE 1 TABLET BY MOUTH DAILY BEFORE BREAKFAST.   lidocaine -prilocaine  cream Commonly known as: EMLA  Apply to affected area once   megestrol  400 MG/10ML suspension Commonly known as: MEGACE  Take 15 mLs (600 mg total) by mouth daily. Start taking on: January 11, 2024   methocarbamol  750 MG tablet Commonly known as: ROBAXIN  Take 1 tablet (750 mg total) by mouth 3 (three) times daily as needed for muscle spasms.   midodrine  5 MG tablet Commonly known as: PROAMATINE  Take 5 mg by mouth 3 (three) times daily as needed.   mirtazapine  30 MG tablet Commonly known as: REMERON  TAKE 0.5 TABLETS  (15 MG TOTAL) BY MOUTH AT BEDTIME.   OLANZapine 5 MG tablet Commonly known as: ZYPREXA Take 5 mg by mouth at bedtime.   ondansetron  8 MG tablet Commonly known as: Zofran  Take 1 tablet (8 mg total) by mouth every 8 (eight) hours as needed for nausea or vomiting. Start on the third day after chemotherapy   Oxycodone  HCl 10 MG Tabs Take 1 tablet (10 mg total) by mouth every 4 (four) hours as needed for moderate pain (pain score 4-6). What changed:  medication strength how much to take reasons to take this   Pancrelipase (Lip-Prot-Amyl) 24000-76000 units Cpep Take 2 capsules by mouth 3 (three) times daily with meals.   Pen Needles 31G X 5 MM Misc 1 each by Does not apply route 3 (three) times daily. May dispense any manufacturer covered by patient's insurance.   polyethylene glycol powder 17 GM/SCOOP powder Commonly known as: GLYCOLAX /MIRALAX  Take 17 g by mouth daily as needed for moderate constipation.   senna 8.6 MG tablet Commonly known as: SENOKOT Take 2 tablets by mouth 2 (two) times  daily. May increase as needed up to 8 tabs per day   sodium fluoride 1.1 % Crea dental cream Commonly known as: PREVIDENT 5000 PLUS Place 1 Application onto teeth 2 (two) times a week.   triamcinolone  cream 0.1 % Commonly known as: KENALOG  APPLY TO AFFECTED AREA TWICE DAILY AS NEEDED        Follow-up Information     Home hospice Follow up.   Why: At earliest convenience               Allergies  Allergen Reactions   Penicillins Shortness Of Breath    Chest pain    Consultations: Oncology/palliative care/IR   Procedures/Studies: CT CELIAC PLEXUS BLOCK ANESTHETIC Result Date: 01/07/2024 INDICATION: Pancreatic cancer with intractable abdominal pain. EXAM: CT-GUIDED CELIAC PLEXUS NERVE BLOCK, DIAGNOSTIC AND THERAPEUTIC COMPARISON:  CT AP, 01/05/2024.  PET-CT, 11/01/2023. MEDICATIONS: 80 mg Kenalog  and 30 mL 0.5% bupivacaine . 25 mg Benadryl  IV. ANESTHESIA/SEDATION: Moderate  (conscious) sedation was employed during this procedure. A total of Versed  3 mg and Fentanyl  150 mcg was administered intravenously. Moderate Sedation Time: 48 minutes. The patient's level of consciousness and vital signs were monitored continuously by radiology nursing throughout the procedure under my direct supervision. CONTRAST:  None. FLUOROSCOPY TIME:  CT dose; 373 mGycm COMPLICATIONS: None immediate. PROCEDURE: RADIATION DOSE REDUCTION: This exam was performed according to the departmental dose-optimization program which includes automated exposure control, adjustment of the mA and/or kV according to patient size and/or use of iterative reconstruction technique. Informed consent was obtained from the patient following an explanation of the procedure, risks, benefits and alternatives. A time out was performed prior to the initiation of the procedure. The patient was positioned prone on the CT table and a limited CT was performed for procedural planning demonstrating an adequate window at the upper abdomen. The procedure was planned. A timeout was performed prior to the initiation of the procedure. The operative site was prepped and draped in the usual sterile fashion. Appropriate trajectory was confirmed with a 22 gauge spinal needle after the adjacent tissues were anesthetized with 1% Lidocaine  with epinephrine. Under intermittent CT guidance, 15 cm 21-gauge Chiba needles were inserted via a posterior approach and the needle tips were positioned in the retroperitoneal space immediately anterolateral to the aorta, at the region of the celiac trunk. A small amount of dilute contrast was injected through the needles to confirm position. A solution containing 2 mL of 40 mg/mL Kenalog  and 15 mL of Bupivacaine  was mixed and injected through each needle. Intermittent CT scanning confirmed appropriate spread into the extra vascular spaces. The needles were removed and hemostasis was achieved with manual compression.  A limited postprocedural CT was negative for hemorrhage or additional complication. A dressing was placed. The patient tolerated the procedure well without immediate postprocedural complication. IMPRESSION: Successful CT-guided diagnostic and therapeutic celiac plexus nerve block, as above. PLAN: The patient will be re-evaluated in the postprocedural setting and again in 1-3 months to assess the efficacy of this nerve block. If successful, the patient may be a candidate for future celiac plexus nerve blocks versus neurolysis. Art Largo, MD [facility] Radiology Electronically Signed   By: Art Largo M.D.   On: 01/07/2024 13:39   CT ABDOMEN PELVIS W CONTRAST Result Date: 01/05/2024 CLINICAL DATA:  Abdominal pain, acute, nonlocalized EXAM: CT ABDOMEN AND PELVIS WITH CONTRAST TECHNIQUE: Multidetector CT imaging of the abdomen and pelvis was performed using the standard protocol following bolus administration of intravenous contrast.  RADIATION DOSE REDUCTION: This exam was performed according to the departmental dose-optimization program which includes automated exposure control, adjustment of the mA and/or kV according to patient size and/or use of iterative reconstruction technique. CONTRAST:  OMNIPAQUE  IOHEXOL  300 MG/ML  SOLN COMPARISON:  October 08, 2023 FINDINGS: Lower chest: No focal airspace consolidation or pleural effusion.Fibrolinear scarring in the lung bases. Partially visualized central venous catheter at the cavoatrial junction. Hepatobiliary: No mass. A couple of small cysts in both hepatic lobes measuring up to 2 cm.No radiopaque stones or wall thickening of the gallbladder.No intrahepatic or extrahepatic biliary ductal dilation.While the portal vein is patent, there is severe narrowing at the portal splenic confluence. Pancreas: Increasing size of the infiltrative mass in the upstream pancreatic body and tail, measuring approximately 4.8 x  7.6 cm (previously, 4.1 by 5 cm, by my measurement). Additionally, hypoattenuating, infiltrative soft tissue extends into the downstream pancreatic body and neck and extending inferiorly into the root of the mesentery (for example, axial 30), encasing and severely narrowing the jejunal branches and the downstream SMV. Additionally, the soft tissue extends posteriorly and encases the celiac artery causing moderate to severe narrowing of celiac trifurcation and splenic arteries. Spleen: Normal size. No mass. Adrenals/Urinary Tract: No adrenal masses. Unchanged left renal cyst. No mass. No nephrolithiasis or hydronephrosis. The urinary bladder is distended without focal abnormality. Stomach/Bowel: Similarly positioned gastric lap band. Diffuse gastric submucosal edema and wall thickening. No small bowel wall thickening or inflammation. No small bowel obstruction. The appendix was not visualized. No right lower quadrant or pericecal inflammatory changes to suggest acute appendicitis. Moderate volume fecal loading throughout the colon. Vascular/Lymphatic: No aortic aneurysm. Scattered aortoiliac atherosclerosis. Centrally necrotic retrocaval lymph node measuring 1.4 cm (axial 35). Reproductive: Age-related atrophy of the uterus and ovaries. No concerning adnexal mass. Calcified uterine fibroids.No free pelvic fluid. Other: Diffuse mesenteric edema. No pneumoperitoneum. Small volume ascites layering in the pelvis. Significant enlargement of a large peritoneal implant in the left upper quadrant, measuring 5.6 x 4.6 cm (previously 2.1 by 1.5 cm), axial 36. Musculoskeletal: No acute fracture or destructive lesion.Multilevel degenerative disc disease of the spine. Diffuse osteopenia. Thoracic DISH. Diffuse anasarca, worsening. Gastric lap band reservoir in the midline ventral abdominal wall. No discontinuity of the catheter tubing. IMPRESSION: 1. Diffuse gastric wall thickening with submucosal edema, which may be due to  hypoproteinemia, acute gastritis, or peptic ulcer disease. 2. Significant enlargement of the infiltrative pancreatic mass, measuring 4.8 x 7.6 cm (previously, 4.1 x 5 cm), by my measurement. Increasing size of a left upper peritoneal nodule measuring 5.6 x 4.6 cm (previously, 2.1 by 1.5 cm). 3. Developing metastatic retrocaval lymphadenopathy, measuring 1.4 cm (axial 35). 4. Moderate volume fecal loading throughout the colon, as can be seen in constipation. 5. Diffuse anasarca with small volume ascites layering in the pelvis. Electronically Signed   By: Rance Burrows M.D.   On: 01/05/2024 15:54      Subjective: Patient seen and examined at bedside.  Wants to go home with hospice today.  States that pain is reasonably well-controlled.  Discharge Exam: Vitals:   01/10/24 0511 01/10/24 0808  BP: 124/86 123/80  Pulse: (!) 105 100  Resp: 16 16  Temp: 98.7 F (37.1 C) 98.7 F (37.1 C)  SpO2: 100% 100%    General: Pt is alert, awake, not in acute distress.  Looks chronically ill and deconditioned.  Flat affect. Cardiovascular: rate controlled, S1/S2 + Respiratory: bilateral decreased breath sounds at bases Abdominal: Soft, NT, ND,  bowel sounds + Extremities: Trace lower extremity edema, no cyanosis    The results of significant diagnostics from this hospitalization (including imaging, microbiology, ancillary and laboratory) are listed below for reference.     Microbiology: No results found for this or any previous visit (from the past 240 hours).   Labs: BNP (last 3 results) Recent Labs    10/08/23 1200  BNP 20.0   Basic Metabolic Panel: Recent Labs  Lab 01/05/24 1330 01/06/24 0523  NA 134* 134*  K 3.3* 3.7  CL 97* 98  CO2 25 23  GLUCOSE 256* 234*  BUN 7* 6*  CREATININE 0.41* 0.43*  CALCIUM  8.4* 8.2*  MG  --  1.6*   Liver Function Tests: Recent Labs  Lab 01/05/24 1330 01/06/24 0523  AST 16 12*  ALT 12 11  ALKPHOS 61 54  BILITOT 0.9 1.0  PROT 5.7* 5.3*   ALBUMIN  2.7* 2.6*   Recent Labs  Lab 01/05/24 1330  LIPASE 23   No results for input(s): "AMMONIA" in the last 168 hours. CBC: Recent Labs  Lab 01/05/24 1330 01/06/24 0523  WBC 5.0 4.4  NEUTROABS 4.5  --   HGB 9.7* 9.0*  HCT 33.8* 30.9*  MCV 71.9* 71.0*  PLT 198 180   Cardiac Enzymes: No results for input(s): "CKTOTAL", "CKMB", "CKMBINDEX", "TROPONINI" in the last 168 hours. BNP: Invalid input(s): "POCBNP" CBG: Recent Labs  Lab 01/09/24 0743 01/09/24 1115 01/09/24 1653 01/09/24 2136 01/10/24 0741  GLUCAP 145* 106* 153* 134* 209*   D-Dimer No results for input(s): "DDIMER" in the last 72 hours. Hgb A1c No results for input(s): "HGBA1C" in the last 72 hours. Lipid Profile No results for input(s): "CHOL", "HDL", "LDLCALC", "TRIG", "CHOLHDL", "LDLDIRECT" in the last 72 hours. Thyroid  function studies No results for input(s): "TSH", "T4TOTAL", "T3FREE", "THYROIDAB" in the last 72 hours.  Invalid input(s): "FREET3" Anemia work up No results for input(s): "VITAMINB12", "FOLATE", "FERRITIN", "TIBC", "IRON", "RETICCTPCT" in the last 72 hours. Urinalysis    Component Value Date/Time   COLORURINE YELLOW 10/08/2023 1141   APPEARANCEUR HAZY (A) 10/08/2023 1141   LABSPEC 1.028 10/08/2023 1141   PHURINE 5.0 10/08/2023 1141   GLUCOSEU >=500 (A) 10/08/2023 1141   HGBUR NEGATIVE 10/08/2023 1141   BILIRUBINUR NEGATIVE 10/08/2023 1141   BILIRUBINUR small (A) 10/06/2023 1507   BILIRUBINUR negative 08/23/2021 1627   KETONESUR 80 (A) 10/08/2023 1141   PROTEINUR 30 (A) 10/08/2023 1141   UROBILINOGEN 0.2 10/06/2023 1507   UROBILINOGEN 0.2 10/23/2008 0448   NITRITE NEGATIVE 10/08/2023 1141   LEUKOCYTESUR MODERATE (A) 10/08/2023 1141   Sepsis Labs Recent Labs  Lab 01/05/24 1330 01/06/24 0523  WBC 5.0 4.4   Microbiology No results found for this or any previous visit (from the past 240 hours).   Time coordinating discharge: 35 minutes  SIGNED:   Audria Leather,  MD  [facility] 01/10/2024, 9:46 AM

## 2024-01-10 NOTE — Plan of Care (Signed)

## 2024-01-13 ENCOUNTER — Telehealth: Payer: Self-pay | Admitting: *Deleted

## 2024-01-13 NOTE — Transitions of Care (Post Inpatient/ED Visit) (Signed)
   01/13/2024  Name: Felicia Garrett MRN: [id number] DOB: [birth year]  Today's TOC FU Call Status: Today's TOC FU Call Status:: Successful TOC FU Call Completed TOC FU Call Complete Date: 01/13/24 Patient's Name and Date of Birth confirmed.  Transition Care Management Follow-up Telephone Call Date of Discharge: 01/10/24 Discharge Facility: [facility]) Type of Discharge: Inpatient Admission Primary Inpatient Discharge Diagnosis:: generalized weakness How have you been since you were released from the hospital?: Same Any questions or concerns?: No  Items Reviewed: Did you receive and understand the discharge instructions provided?: Yes Medications obtained,verified, and reconciled?: No Medications Not Reviewed Reasons:: Other: (Patient declined medication review, reports Hospice is managing her medications) Any new allergies since your discharge?: No Dietary orders reviewed?: Yes Type of Diet Ordered:: Carb Modified Do you have support at home?: Yes People in Home [RPT]: sibling(s) Name of Support/Comfort Primary Source: Sister/Freida and Hospice staff  Medications Reviewed Today:Hospice is managing patient's medications Medications Reviewed Today   Medications were not reviewed in this encounter     Home Care and Equipment/Supplies: Were Home Health Services Ordered?: Yes Name of Home Health Agency:: [facility] Has Agency set up a time to come to your home?: Yes First Home Health Visit Date:  (Hospice is active with patient. Patient denies any needs) Any new equipment or medical supplies ordered?: No  Functional Questionnaire: Do you need assistance with bathing/showering or dressing?: Yes (home with hospice care) Do you need assistance with meal preparation?: Yes (home with hospice care) Do you need assistance with eating?: Yes (home with hospice care) Do you have difficulty maintaining continence: Yes (home with hospice care) Do you need assistance with getting out of  bed/getting out of a chair/moving?: Yes (home with hospice care) Do you have difficulty managing or taking your medications?: Yes (home with hospice care)  Follow up appointments reviewed: PCP Follow-up appointment confirmed?: NA (Patient discharged to Hospice care) Specialist Hospital Follow-up appointment confirmed?: NA Do you need transportation to your follow-up appointment?: No Do you understand care options if your condition(s) worsen?: Yes-patient verbalized understanding    Arna Better RN, BSN [place]  [facility] RN Care Manager [phone]

## 2024-01-23 DEATH — deceased

## 2024-02-04 ENCOUNTER — Other Ambulatory Visit: Payer: Self-pay | Admitting: Hematology

## 2024-04-22 ENCOUNTER — Ambulatory Visit: Payer: Medicare PPO | Admitting: Internal Medicine

## 2024-04-22 ENCOUNTER — Other Ambulatory Visit: Payer: Medicare PPO
